# Patient Record
Sex: Male | Born: 1945
Health system: Southern US, Community
[De-identification: ages and names within clinical notes are randomized; demographics above are authoritative.]

## PROBLEM LIST (undated history)

## (undated) DIAGNOSIS — N400 Enlarged prostate without lower urinary tract symptoms: Secondary | ICD-10-CM

## (undated) DIAGNOSIS — I639 Cerebral infarction, unspecified: Secondary | ICD-10-CM

## (undated) DIAGNOSIS — I10 Essential (primary) hypertension: Secondary | ICD-10-CM

## (undated) DIAGNOSIS — E785 Hyperlipidemia, unspecified: Secondary | ICD-10-CM

## (undated) DIAGNOSIS — K219 Gastro-esophageal reflux disease without esophagitis: Secondary | ICD-10-CM

## (undated) HISTORY — PX: TONSILLECTOMY: SUR1361

## (undated) HISTORY — DX: Gastro-esophageal reflux disease without esophagitis: K21.9

## (undated) HISTORY — DX: Hyperlipidemia, unspecified: E78.5

## (undated) HISTORY — PX: COLONOSCOPY: SHX174

## (undated) HISTORY — DX: Benign prostatic hyperplasia without lower urinary tract symptoms: N40.0

## (undated) HISTORY — DX: Essential (primary) hypertension: I10

---

## 2005-02-04 ENCOUNTER — Ambulatory Visit: Payer: Self-pay | Admitting: Unknown Physician Specialty

## 2012-06-01 DIAGNOSIS — R809 Proteinuria, unspecified: Secondary | ICD-10-CM | POA: Insufficient documentation

## 2013-07-01 DIAGNOSIS — E785 Hyperlipidemia, unspecified: Secondary | ICD-10-CM | POA: Diagnosis not present

## 2013-07-01 DIAGNOSIS — I10 Essential (primary) hypertension: Secondary | ICD-10-CM | POA: Diagnosis not present

## 2013-08-05 DIAGNOSIS — N401 Enlarged prostate with lower urinary tract symptoms: Secondary | ICD-10-CM | POA: Diagnosis not present

## 2013-08-05 DIAGNOSIS — N529 Male erectile dysfunction, unspecified: Secondary | ICD-10-CM | POA: Diagnosis not present

## 2013-08-05 DIAGNOSIS — R972 Elevated prostate specific antigen [PSA]: Secondary | ICD-10-CM | POA: Diagnosis not present

## 2013-12-16 DIAGNOSIS — Q825 Congenital non-neoplastic nevus: Secondary | ICD-10-CM | POA: Diagnosis not present

## 2013-12-16 DIAGNOSIS — L570 Actinic keratosis: Secondary | ICD-10-CM | POA: Diagnosis not present

## 2013-12-16 DIAGNOSIS — L819 Disorder of pigmentation, unspecified: Secondary | ICD-10-CM | POA: Diagnosis not present

## 2013-12-16 DIAGNOSIS — D1801 Hemangioma of skin and subcutaneous tissue: Secondary | ICD-10-CM | POA: Diagnosis not present

## 2013-12-16 DIAGNOSIS — L821 Other seborrheic keratosis: Secondary | ICD-10-CM | POA: Diagnosis not present

## 2013-12-26 DIAGNOSIS — I10 Essential (primary) hypertension: Secondary | ICD-10-CM | POA: Diagnosis not present

## 2013-12-26 DIAGNOSIS — E785 Hyperlipidemia, unspecified: Secondary | ICD-10-CM | POA: Diagnosis not present

## 2013-12-26 DIAGNOSIS — R69 Illness, unspecified: Secondary | ICD-10-CM | POA: Diagnosis not present

## 2013-12-26 DIAGNOSIS — F411 Generalized anxiety disorder: Secondary | ICD-10-CM | POA: Diagnosis not present

## 2014-06-27 DIAGNOSIS — E784 Other hyperlipidemia: Secondary | ICD-10-CM | POA: Diagnosis not present

## 2014-06-27 DIAGNOSIS — Z23 Encounter for immunization: Secondary | ICD-10-CM | POA: Diagnosis not present

## 2014-06-27 DIAGNOSIS — E785 Hyperlipidemia, unspecified: Secondary | ICD-10-CM | POA: Diagnosis not present

## 2014-06-27 DIAGNOSIS — I10 Essential (primary) hypertension: Secondary | ICD-10-CM | POA: Diagnosis not present

## 2014-08-04 DIAGNOSIS — N401 Enlarged prostate with lower urinary tract symptoms: Secondary | ICD-10-CM | POA: Diagnosis not present

## 2014-08-04 DIAGNOSIS — N529 Male erectile dysfunction, unspecified: Secondary | ICD-10-CM | POA: Diagnosis not present

## 2014-08-04 DIAGNOSIS — Z79899 Other long term (current) drug therapy: Secondary | ICD-10-CM | POA: Diagnosis not present

## 2014-08-04 DIAGNOSIS — R972 Elevated prostate specific antigen [PSA]: Secondary | ICD-10-CM | POA: Diagnosis not present

## 2014-08-04 DIAGNOSIS — I10 Essential (primary) hypertension: Secondary | ICD-10-CM | POA: Diagnosis not present

## 2014-12-29 DIAGNOSIS — I10 Essential (primary) hypertension: Secondary | ICD-10-CM | POA: Diagnosis not present

## 2014-12-29 DIAGNOSIS — E784 Other hyperlipidemia: Secondary | ICD-10-CM | POA: Diagnosis not present

## 2014-12-29 LAB — HEPATIC FUNCTION PANEL
ALT: 19 U/L (ref 10–40)
AST: 16 U/L (ref 14–40)

## 2014-12-29 LAB — BASIC METABOLIC PANEL
BUN: 12 mg/dL (ref 4–21)
Creatinine: 0.8 mg/dL (ref <=1.3)
GLUCOSE: 97 mg/dL

## 2014-12-29 LAB — LIPID PANEL
Cholesterol: 163 mg/dL (ref 0–200)
HDL: 83 mg/dL — AB (ref 35–70)
LDL CALC: 69 mg/dL
Triglycerides: 56 mg/dL (ref 40–160)

## 2014-12-29 LAB — FECAL OCCULT BLOOD, GUAIAC: Fecal Occult Blood: NEGATIVE

## 2015-01-07 ENCOUNTER — Ambulatory Visit
Admit: 2015-01-07 | Disposition: A | Discharge status: Home / Self Care | Payer: Self-pay | Attending: Unknown Physician Specialty | Admitting: Unknown Physician Specialty

## 2015-01-07 DIAGNOSIS — R1013 Epigastric pain: Secondary | ICD-10-CM | POA: Diagnosis not present

## 2015-01-07 DIAGNOSIS — K5730 Diverticulosis of large intestine without perforation or abscess without bleeding: Secondary | ICD-10-CM | POA: Diagnosis not present

## 2015-01-07 DIAGNOSIS — K2960 Other gastritis without bleeding: Secondary | ICD-10-CM | POA: Diagnosis not present

## 2015-01-07 DIAGNOSIS — K2970 Gastritis, unspecified, without bleeding: Secondary | ICD-10-CM | POA: Diagnosis not present

## 2015-01-07 DIAGNOSIS — K210 Gastro-esophageal reflux disease with esophagitis: Secondary | ICD-10-CM | POA: Diagnosis not present

## 2015-01-07 DIAGNOSIS — K449 Diaphragmatic hernia without obstruction or gangrene: Secondary | ICD-10-CM | POA: Diagnosis not present

## 2015-01-07 DIAGNOSIS — Z7982 Long term (current) use of aspirin: Secondary | ICD-10-CM | POA: Diagnosis not present

## 2015-01-07 DIAGNOSIS — D123 Benign neoplasm of transverse colon: Secondary | ICD-10-CM | POA: Diagnosis not present

## 2015-01-07 DIAGNOSIS — Z1211 Encounter for screening for malignant neoplasm of colon: Secondary | ICD-10-CM | POA: Diagnosis not present

## 2015-01-07 DIAGNOSIS — Z79899 Other long term (current) drug therapy: Secondary | ICD-10-CM | POA: Diagnosis not present

## 2015-01-07 DIAGNOSIS — K640 First degree hemorrhoids: Secondary | ICD-10-CM | POA: Diagnosis not present

## 2015-01-07 DIAGNOSIS — D122 Benign neoplasm of ascending colon: Secondary | ICD-10-CM | POA: Diagnosis not present

## 2015-01-07 DIAGNOSIS — I10 Essential (primary) hypertension: Secondary | ICD-10-CM | POA: Diagnosis not present

## 2015-01-07 DIAGNOSIS — K3189 Other diseases of stomach and duodenum: Secondary | ICD-10-CM | POA: Diagnosis not present

## 2015-01-07 LAB — HM COLONOSCOPY

## 2015-01-08 LAB — SURGICAL PATHOLOGY

## 2015-01-11 HISTORY — PX: UPPER GASTROINTESTINAL ENDOSCOPY: SHX188

## 2015-01-23 DIAGNOSIS — Z0000 Encounter for general adult medical examination without abnormal findings: Secondary | ICD-10-CM | POA: Insufficient documentation

## 2015-01-23 DIAGNOSIS — I10 Essential (primary) hypertension: Secondary | ICD-10-CM | POA: Insufficient documentation

## 2015-01-23 DIAGNOSIS — F064 Anxiety disorder due to known physiological condition: Secondary | ICD-10-CM | POA: Insufficient documentation

## 2015-01-23 DIAGNOSIS — E7849 Other hyperlipidemia: Secondary | ICD-10-CM | POA: Insufficient documentation

## 2015-01-23 DIAGNOSIS — F4320 Adjustment disorder, unspecified: Secondary | ICD-10-CM | POA: Insufficient documentation

## 2015-03-03 ENCOUNTER — Other Ambulatory Visit: Payer: Self-pay | Admitting: Family Medicine

## 2015-03-03 DIAGNOSIS — I10 Essential (primary) hypertension: Secondary | ICD-10-CM

## 2015-06-29 ENCOUNTER — Ambulatory Visit (INDEPENDENT_AMBULATORY_CARE_PROVIDER_SITE_OTHER): Payer: Medicare Other | Admitting: Family Medicine

## 2015-06-29 ENCOUNTER — Encounter: Payer: Self-pay | Admitting: Family Medicine

## 2015-06-29 VITALS — BP 122/70 | HR 64 | Ht 68.0 in | Wt 168.0 lb

## 2015-06-29 DIAGNOSIS — E7849 Other hyperlipidemia: Secondary | ICD-10-CM

## 2015-06-29 DIAGNOSIS — E784 Other hyperlipidemia: Secondary | ICD-10-CM

## 2015-06-29 DIAGNOSIS — I10 Essential (primary) hypertension: Secondary | ICD-10-CM

## 2015-06-29 DIAGNOSIS — Z23 Encounter for immunization: Secondary | ICD-10-CM | POA: Diagnosis not present

## 2015-06-29 MED ORDER — SIMVASTATIN 20 MG PO TABS: 20.0000 mg | ORAL_TABLET | Freq: Every day | ORAL | Status: DC

## 2015-06-29 MED ORDER — FISH OIL 1000 MG PO CAPS: 1.0000 | ORAL_CAPSULE | Freq: Two times a day (BID) | ORAL | Status: DC

## 2015-06-29 MED ORDER — NIFEDIPINE ER OSMOTIC RELEASE 30 MG PO TB24: 30.0000 mg | ORAL_TABLET | Freq: Every day | ORAL | Status: DC

## 2015-06-29 MED ORDER — LISINOPRIL 10 MG PO TABS: 10.0000 mg | ORAL_TABLET | Freq: Every day | ORAL | Status: DC

## 2015-06-29 NOTE — Progress Notes (Signed)
Name: Alfred Edwards   MRN: [id number]    DOB: [birth year]   Date:06/29/2015       Progress Note  Subjective  Chief Complaint  Chief Complaint  Patient presents with  . Hypertension  . Hyperlipidemia    Hypertension This is a chronic problem. The current episode started more than 1 year ago. The problem has been gradually improving since onset. The problem is controlled. Pertinent negatives include no anxiety, blurred vision, chest pain, headaches, malaise/fatigue, neck pain, orthopnea, palpitations, peripheral edema, PND, shortness of breath or sweats. There are no associated agents to hypertension. Risk factors for coronary artery disease include dyslipidemia. Past treatments include ACE inhibitors and calcium channel blockers. The current treatment provides moderate improvement. There are no compliance problems.  There is no history of angina, kidney disease, CAD/MI, CVA, heart failure, left ventricular hypertrophy, PVD, renovascular disease or retinopathy. There is no history of chronic renal disease.  Hyperlipidemia This is a recurrent problem. The current episode started more than 1 year ago. The problem is controlled. Recent lipid tests were reviewed and are normal. He has no history of chronic renal disease, diabetes, hypothyroidism, liver disease, obesity or nephrotic syndrome. There are no known factors aggravating his hyperlipidemia. Pertinent negatives include no chest pain, focal sensory loss, focal weakness, leg pain, myalgias or shortness of breath. Current antihyperlipidemic treatment includes statins. The current treatment provides moderate improvement of lipids. There are no compliance problems.     No problem-specific assessment & plan notes found for this encounter.   Past Medical History  Diagnosis Date  . GERD (gastroesophageal reflux disease)   . Hyperlipidemia   . Hypertension   . BPH (benign prostatic hypertrophy)     Past Surgical History  Procedure  Laterality Date  . Tonsillectomy    . Colonoscopy  2005, 01/2015    Dr Vira Agar- cleared for 3 yrs  . Upper gastrointestinal endoscopy  01/2015    Dr Vira Agar- erosions on esophagus    History reviewed. No pertinent family history.  Social History   Social History  . Marital Status: Widowed    Spouse Name: N/A  . Number of Children: N/A  . Years of Education: N/A   Occupational History  . Not on file.   Social History Main Topics  . Smoking status: Former Research scientist (life sciences)  . Smokeless tobacco: Not on file  . Alcohol Use: 0.0 oz/week    0 Standard drinks or equivalent per week  . Drug Use: No  . Sexual Activity: Yes   Other Topics Concern  . Not on file   Social History Narrative    No Known Allergies   Review of Systems  Constitutional: Negative for fever, chills, weight loss and malaise/fatigue.  HENT: Negative for ear discharge, ear pain and sore throat.   Eyes: Negative for blurred vision.  Respiratory: Negative for cough, sputum production, shortness of breath and wheezing.   Cardiovascular: Negative for chest pain, palpitations, orthopnea, leg swelling and PND.  Gastrointestinal: Negative for heartburn, nausea, abdominal pain, diarrhea, constipation, blood in stool and melena.  Genitourinary: Negative for dysuria, urgency, frequency and hematuria.  Musculoskeletal: Negative for myalgias, back pain, joint pain and neck pain.  Skin: Negative for rash.  Neurological: Negative for dizziness, tingling, sensory change, focal weakness and headaches.  Endo/Heme/Allergies: Negative for environmental allergies and polydipsia. Does not bruise/bleed easily.  Psychiatric/Behavioral: Negative for depression and suicidal ideas. The patient is not nervous/anxious and does not have insomnia.      Objective  Filed  Vitals:   06/29/15 0904  BP: 122/70  Pulse: 64  Height: 5\' 8"  (1.727 m)  Weight: 168 lb (76.204 kg)    Physical Exam  Constitutional: He is oriented to person,  place, and time and well-developed, well-nourished, and in no distress.  HENT:  Head: Normocephalic.  Right Ear: External ear normal.  Left Ear: External ear normal.  Nose: Nose normal.  Mouth/Throat: Oropharynx is clear and moist.  Eyes: Conjunctivae and EOM are normal. Pupils are equal, round, and reactive to light. Right eye exhibits no discharge. Left eye exhibits no discharge. No scleral icterus.  Neck: Normal range of motion. Neck supple. No JVD present. No tracheal deviation present. No thyromegaly present.  Cardiovascular: Normal rate, regular rhythm, normal heart sounds and intact distal pulses.  Exam reveals no gallop and no friction rub.   No murmur heard. Pulmonary/Chest: Breath sounds normal. No respiratory distress. He has no wheezes. He has no rales.  Abdominal: Soft. Bowel sounds are normal. He exhibits no mass. There is no hepatosplenomegaly. There is no tenderness. There is no rebound, no guarding and no CVA tenderness.  Musculoskeletal: Normal range of motion. He exhibits no edema or tenderness.  Lymphadenopathy:    He has no cervical adenopathy.  Neurological: He is alert and oriented to person, place, and time. He has normal sensation, normal strength, normal reflexes and intact cranial nerves. No cranial nerve deficit.  Skin: Skin is warm. No rash noted.  Psychiatric: Mood and affect normal.  Nursing note and vitals reviewed.     Assessment & Plan  Problem List Items Addressed This Visit      Cardiovascular and Mediastinum   Essential (primary) hypertension   Relevant Medications   lisinopril (PRINIVIL,ZESTRIL) 10 MG tablet   NIFEdipine (PROCARDIA-XL/ADALAT-CC/NIFEDICAL-XL) 30 MG 24 hr tablet   simvastatin (ZOCOR) 20 MG tablet     Other   Familial multiple lipoprotein-type hyperlipidemia   Relevant Medications   lisinopril (PRINIVIL,ZESTRIL) 10 MG tablet   NIFEdipine (PROCARDIA-XL/ADALAT-CC/NIFEDICAL-XL) 30 MG 24 hr tablet   simvastatin (ZOCOR) 20 MG  tablet   Omega-3 Fatty Acids (FISH OIL) 1000 MG CAPS   Other Relevant Orders   Lipid Profile    Other Visit Diagnoses    Need for influenza vaccination    -  Primary    Relevant Orders    Flu Vaccine QUAD 36+ mos PF IM (Fluarix & Fluzone Quad PF) (Completed)    Essential hypertension        Relevant Medications    lisinopril (PRINIVIL,ZESTRIL) 10 MG tablet    NIFEdipine (PROCARDIA-XL/ADALAT-CC/NIFEDICAL-XL) 30 MG 24 hr tablet    simvastatin (ZOCOR) 20 MG tablet    Other Relevant Orders    Renal Function Panel         Dr. Otilio Miu [facility]  06/29/2015

## 2015-06-30 LAB — LIPID PANEL
CHOL/HDL RATIO: 1.9 ratio (ref 0.0–5.0)
Cholesterol, Total: 166 mg/dL (ref 100–199)
HDL: 87 mg/dL (ref >39)
LDL Calculated: 65 mg/dL (ref 0–99)
Triglycerides: 70 mg/dL (ref 0–149)
VLDL Cholesterol Cal: 14 mg/dL (ref 5–40)

## 2015-06-30 LAB — RENAL FUNCTION PANEL
Albumin: 4.1 g/dL (ref 3.6–4.8)
BUN / CREAT RATIO: 11 (ref 10–22)
BUN: 10 mg/dL (ref 8–27)
CO2: 26 mmol/L (ref 18–29)
CREATININE: 0.9 mg/dL (ref 0.76–1.27)
Calcium: 9.2 mg/dL (ref 8.6–10.2)
Chloride: 102 mmol/L (ref 97–106)
GFR calc Af Amer: 102 mL/min/{1.73_m2} (ref >59)
GFR, EST NON AFRICAN AMERICAN: 88 mL/min/{1.73_m2} (ref >59)
Glucose: 91 mg/dL (ref 65–99)
Phosphorus: 2.7 mg/dL (ref 2.5–4.5)
Potassium: 4.9 mmol/L (ref 3.5–5.2)
SODIUM: 140 mmol/L (ref 136–144)

## 2015-08-10 DIAGNOSIS — R972 Elevated prostate specific antigen [PSA]: Secondary | ICD-10-CM | POA: Diagnosis not present

## 2015-08-10 DIAGNOSIS — N401 Enlarged prostate with lower urinary tract symptoms: Secondary | ICD-10-CM | POA: Diagnosis not present

## 2015-08-10 DIAGNOSIS — Z6826 Body mass index (BMI) 26.0-26.9, adult: Secondary | ICD-10-CM | POA: Diagnosis not present

## 2015-08-24 ENCOUNTER — Other Ambulatory Visit: Payer: Self-pay | Admitting: Family Medicine

## 2015-11-22 ENCOUNTER — Other Ambulatory Visit: Payer: Self-pay | Admitting: Family Medicine

## 2015-11-28 ENCOUNTER — Other Ambulatory Visit: Payer: Self-pay | Admitting: Family Medicine

## 2015-12-28 ENCOUNTER — Ambulatory Visit (INDEPENDENT_AMBULATORY_CARE_PROVIDER_SITE_OTHER): Payer: Medicare Other | Admitting: Family Medicine

## 2015-12-28 ENCOUNTER — Encounter: Payer: Self-pay | Admitting: Family Medicine

## 2015-12-28 VITALS — BP 120/70 | HR 64 | Ht 68.0 in | Wt 170.0 lb

## 2015-12-28 DIAGNOSIS — I10 Essential (primary) hypertension: Secondary | ICD-10-CM

## 2015-12-28 DIAGNOSIS — E785 Hyperlipidemia, unspecified: Secondary | ICD-10-CM | POA: Diagnosis not present

## 2015-12-28 DIAGNOSIS — N401 Enlarged prostate with lower urinary tract symptoms: Secondary | ICD-10-CM | POA: Diagnosis not present

## 2015-12-28 DIAGNOSIS — Z0000 Encounter for general adult medical examination without abnormal findings: Secondary | ICD-10-CM

## 2015-12-28 DIAGNOSIS — D229 Melanocytic nevi, unspecified: Secondary | ICD-10-CM

## 2015-12-28 DIAGNOSIS — M791 Myalgia, unspecified site: Secondary | ICD-10-CM

## 2015-12-28 DIAGNOSIS — R351 Nocturia: Secondary | ICD-10-CM | POA: Diagnosis not present

## 2015-12-28 DIAGNOSIS — Z1211 Encounter for screening for malignant neoplasm of colon: Secondary | ICD-10-CM

## 2015-12-28 LAB — POCT URINALYSIS DIPSTICK
Bilirubin, UA: NEGATIVE
Blood, UA: NEGATIVE
GLUCOSE UA: NEGATIVE
KETONES UA: NEGATIVE
Leukocytes, UA: NEGATIVE
Nitrite, UA: NEGATIVE
Protein, UA: NEGATIVE
SPEC GRAV UA: 1.01
Urobilinogen, UA: 0.2
pH, UA: 5

## 2015-12-28 LAB — HEMOCCULT GUIAC POC 1CARD (OFFICE): FECAL OCCULT BLD: NEGATIVE

## 2015-12-28 MED ORDER — NIFEDIPINE ER OSMOTIC RELEASE 30 MG PO TB24: ORAL_TABLET | ORAL | Status: DC

## 2015-12-28 MED ORDER — LISINOPRIL 10 MG PO TABS: 10.0000 mg | ORAL_TABLET | Freq: Every day | ORAL | Status: DC

## 2015-12-28 NOTE — Patient Instructions (Signed)

## 2015-12-28 NOTE — Progress Notes (Signed)
Name: Alfred Edwards   MRN: [id number]    DOB: 1946-05-03   Date:12/28/2015       Progress Note  Subjective  Chief Complaint  Chief Complaint  Patient presents with  . Annual Exam  . Hypertension  . Hyperlipidemia    HPI Comments: Patient presents for annual physical exam.  Hypertension This is a chronic problem. The current episode started more than 1 year ago. The problem has been gradually improving since onset. The problem is controlled. Pertinent negatives include no anxiety, blurred vision, chest pain, headaches, malaise/fatigue, neck pain, orthopnea, palpitations, peripheral edema, PND, shortness of breath or sweats. There are no associated agents to hypertension. Risk factors for coronary artery disease include dyslipidemia, male gender, sedentary lifestyle and post-menopausal state. Past treatments include ACE inhibitors and calcium channel blockers. The current treatment provides moderate improvement. There are no compliance problems.  There is no history of angina, kidney disease, CAD/MI, CVA, heart failure, left ventricular hypertrophy, PVD, renovascular disease or retinopathy. There is no history of chronic renal disease or a hypertension causing med.  Hyperlipidemia This is a chronic problem. The current episode started more than 1 year ago. The problem is controlled. Recent lipid tests were reviewed and are normal. He has no history of chronic renal disease, diabetes, hypothyroidism, liver disease, obesity or nephrotic syndrome. Associated symptoms include myalgias. Pertinent negatives include no chest pain, focal sensory loss, focal weakness, leg pain or shortness of breath. He is currently on no antihyperlipidemic treatment. The current treatment provides moderate improvement of lipids. There are no compliance problems.     No problem-specific assessment & plan notes found for this encounter.   Past Medical History  Diagnosis Date  . GERD (gastroesophageal reflux disease)    . Hyperlipidemia   . Hypertension   . BPH (benign prostatic hypertrophy)     Past Surgical History  Procedure Laterality Date  . Tonsillectomy    . Colonoscopy  2005, 01/2015    Dr Vira Agar- cleared for 3 yrs  . Upper gastrointestinal endoscopy  01/2015    Dr Vira Agar- erosions on esophagus    History reviewed. No pertinent family history.  Social History   Social History  . Marital Status: Widowed    Spouse Name: N/A  . Number of Children: N/A  . Years of Education: N/A   Occupational History  . Not on file.   Social History Main Topics  . Smoking status: Former Research scientist (life sciences)  . Smokeless tobacco: Not on file  . Alcohol Use: 0.0 oz/week    0 Standard drinks or equivalent per week  . Drug Use: No  . Sexual Activity: Yes   Other Topics Concern  . Not on file   Social History Narrative    No Known Allergies   Review of Systems  Constitutional: Negative for fever, chills, weight loss and malaise/fatigue.  HENT: Negative for ear discharge, ear pain and sore throat.   Eyes: Negative for blurred vision.  Respiratory: Negative for cough, sputum production, shortness of breath and wheezing.   Cardiovascular: Negative for chest pain, palpitations, orthopnea, leg swelling and PND.  Gastrointestinal: Negative for heartburn, nausea, abdominal pain, diarrhea, constipation, blood in stool and melena.  Genitourinary: Negative for dysuria, urgency, frequency and hematuria.       Nocturia  Musculoskeletal: Positive for myalgias. Negative for back pain, joint pain and neck pain.       Back and legs  Skin: Negative for rash.  Neurological: Negative for dizziness, tingling, sensory change, focal weakness  and headaches.  Endo/Heme/Allergies: Negative for environmental allergies and polydipsia. Does not bruise/bleed easily.  Psychiatric/Behavioral: Negative for depression and suicidal ideas. The patient is not nervous/anxious and does not have insomnia.      Objective  Filed  Vitals:   12/28/15 0942  BP: 120/70  Pulse: 64  Height: 5\' 8"  (1.727 m)  Weight: 170 lb (77.111 kg)    Physical Exam  Constitutional: He is oriented to person, place, and time and well-developed, well-nourished, and in no distress.  HENT:  Head: Normocephalic.  Right Ear: External ear normal.  Left Ear: External ear normal.  Nose: Nose normal.  Mouth/Throat: Oropharynx is clear and moist.  Eyes: Conjunctivae and EOM are normal. Pupils are equal, round, and reactive to light. Right eye exhibits no discharge. Left eye exhibits no discharge. No scleral icterus.  Neck: Normal range of motion. Neck supple. No JVD present. No tracheal deviation present. No thyromegaly present.  Cardiovascular: Normal rate, regular rhythm, normal heart sounds and intact distal pulses.  Exam reveals no gallop and no friction rub.   No murmur heard. Pulmonary/Chest: Breath sounds normal. No respiratory distress. He has no wheezes. He has no rales.  Abdominal: Soft. Bowel sounds are normal. He exhibits no mass. There is no hepatosplenomegaly. There is no tenderness. There is no rebound, no guarding and no CVA tenderness.  Genitourinary: Rectum normal, prostate normal and testes/scrotum normal. Guaiac negative stool.  Musculoskeletal: Normal range of motion. He exhibits no edema or tenderness.  Lymphadenopathy:    He has no cervical adenopathy.  Neurological: He is alert and oriented to person, place, and time. He has normal sensation, normal strength, normal reflexes and intact cranial nerves. No cranial nerve deficit.  Skin: Skin is warm. No rash noted.     Pigmented macular nevi x 2  Psychiatric: Mood and affect normal.  Nursing note and vitals reviewed.     Assessment & Plan  Problem List Items Addressed This Visit      Cardiovascular and Mediastinum   Essential hypertension   Relevant Medications   lisinopril (PRINIVIL,ZESTRIL) 10 MG tablet   NIFEdipine (PROCARDIA-XL/ADALAT-CC/NIFEDICAL-XL) 30  MG 24 hr tablet   Other Relevant Orders   POCT Urinalysis Dipstick (Completed)   Renal Function Panel     Genitourinary   Nocturia associated with benign prostatic hypertrophy   Relevant Orders   PSA     Other   Hyperlipidemia   Relevant Medications   lisinopril (PRINIVIL,ZESTRIL) 10 MG tablet   NIFEdipine (PROCARDIA-XL/ADALAT-CC/NIFEDICAL-XL) 30 MG 24 hr tablet    Other Visit Diagnoses    Annual physical exam    -  Primary    Multiple pigmented nevi        Relevant Orders    Ambulatory referral to Dermatology    Myalgia        will d/c statin recheck lipid 4 wks    Colon cancer screening        Relevant Orders    POCT Occult Blood Stool (Completed)         Dr. Otilio Miu [facility]  12/28/2015

## 2015-12-29 LAB — RENAL FUNCTION PANEL
Albumin: 4.5 g/dL (ref 3.6–4.8)
BUN / CREAT RATIO: 14 (ref 10–24)
BUN: 15 mg/dL (ref 8–27)
CALCIUM: 9.6 mg/dL (ref 8.6–10.2)
CO2: 26 mmol/L (ref 18–29)
Chloride: 101 mmol/L (ref 96–106)
Creatinine, Ser: 1.04 mg/dL (ref 0.76–1.27)
GFR calc Af Amer: 85 mL/min/{1.73_m2} (ref >59)
GFR calc non Af Amer: 74 mL/min/{1.73_m2} (ref >59)
GLUCOSE: 88 mg/dL (ref 65–99)
PHOSPHORUS: 2.9 mg/dL (ref 2.5–4.5)
POTASSIUM: 5.4 mmol/L — AB (ref 3.5–5.2)
Sodium: 144 mmol/L (ref 134–144)

## 2015-12-29 LAB — PSA: Prostate Specific Ag, Serum: 2.3 ng/mL (ref 0.0–4.0)

## 2015-12-30 DIAGNOSIS — L570 Actinic keratosis: Secondary | ICD-10-CM | POA: Diagnosis not present

## 2015-12-30 DIAGNOSIS — L821 Other seborrheic keratosis: Secondary | ICD-10-CM | POA: Diagnosis not present

## 2015-12-30 DIAGNOSIS — D485 Neoplasm of uncertain behavior of skin: Secondary | ICD-10-CM | POA: Diagnosis not present

## 2016-01-27 DIAGNOSIS — D225 Melanocytic nevi of trunk: Secondary | ICD-10-CM | POA: Diagnosis not present

## 2016-01-27 DIAGNOSIS — D485 Neoplasm of uncertain behavior of skin: Secondary | ICD-10-CM | POA: Diagnosis not present

## 2016-02-18 ENCOUNTER — Other Ambulatory Visit: Payer: Medicare Other

## 2016-02-18 DIAGNOSIS — E785 Hyperlipidemia, unspecified: Secondary | ICD-10-CM | POA: Diagnosis not present

## 2016-02-19 LAB — LIPID PANEL WITH LDL/HDL RATIO
CHOLESTEROL TOTAL: 174 mg/dL (ref 100–199)
HDL: 83 mg/dL (ref >39)
LDL Calculated: 81 mg/dL (ref 0–99)
LDl/HDL Ratio: 1 ratio units (ref 0.0–3.6)
TRIGLYCERIDES: 52 mg/dL (ref 0–149)
VLDL Cholesterol Cal: 10 mg/dL (ref 5–40)

## 2016-02-19 NOTE — Addendum Note (Signed)
Addended by: Otilio Miu C on: 02/19/2016 12:03 PM   Modules accepted: Medications

## 2016-02-28 ENCOUNTER — Other Ambulatory Visit: Payer: Self-pay | Admitting: Family Medicine

## 2016-03-23 ENCOUNTER — Other Ambulatory Visit: Payer: Self-pay | Admitting: Family Medicine

## 2016-05-27 ENCOUNTER — Other Ambulatory Visit: Payer: Self-pay | Admitting: Family Medicine

## 2016-07-05 ENCOUNTER — Other Ambulatory Visit: Payer: Self-pay | Admitting: Family Medicine

## 2016-08-02 ENCOUNTER — Encounter: Payer: Self-pay | Admitting: Family Medicine

## 2016-08-02 ENCOUNTER — Ambulatory Visit (INDEPENDENT_AMBULATORY_CARE_PROVIDER_SITE_OTHER): Payer: Medicare Other | Admitting: Family Medicine

## 2016-08-02 VITALS — BP 132/78 | HR 68 | Ht 68.0 in | Wt 175.0 lb

## 2016-08-02 DIAGNOSIS — K219 Gastro-esophageal reflux disease without esophagitis: Secondary | ICD-10-CM | POA: Diagnosis not present

## 2016-08-02 DIAGNOSIS — E784 Other hyperlipidemia: Secondary | ICD-10-CM | POA: Diagnosis not present

## 2016-08-02 DIAGNOSIS — E7849 Other hyperlipidemia: Secondary | ICD-10-CM

## 2016-08-02 DIAGNOSIS — I10 Essential (primary) hypertension: Secondary | ICD-10-CM

## 2016-08-02 DIAGNOSIS — Z23 Encounter for immunization: Secondary | ICD-10-CM

## 2016-08-02 MED ORDER — SIMVASTATIN 20 MG PO TABS: 20.0000 mg | ORAL_TABLET | Freq: Every day | ORAL | 1 refills | Status: DC

## 2016-08-02 MED ORDER — LISINOPRIL 10 MG PO TABS: 10.0000 mg | ORAL_TABLET | Freq: Every day | ORAL | 1 refills | Status: DC

## 2016-08-02 MED ORDER — NIFEDIPINE ER OSMOTIC RELEASE 30 MG PO TB24: ORAL_TABLET | ORAL | 1 refills | Status: DC

## 2016-08-02 MED ORDER — RANITIDINE HCL 300 MG PO TABS: 300.0000 mg | ORAL_TABLET | Freq: Every day | ORAL | 2 refills | Status: DC

## 2016-08-02 NOTE — Progress Notes (Signed)
Name: Alfred Edwards   MRN: [id number]    DOB: 11-29-1945   Date:08/02/2016       Progress Note  Subjective  Chief Complaint  Chief Complaint  Patient presents with  . Hypertension  . Hyperlipidemia    Hypertension  This is a chronic problem. The current episode started more than 1 year ago. The problem has been gradually improving since onset. The problem is controlled. Pertinent negatives include no anxiety, blurred vision, chest pain, headaches, malaise/fatigue, neck pain, orthopnea, palpitations, peripheral edema, PND, shortness of breath or sweats. There are no associated agents to hypertension. There are no known risk factors for coronary artery disease. Past treatments include ACE inhibitors and calcium channel blockers. The current treatment provides moderate improvement. There are no compliance problems.  There is no history of angina, kidney disease, CAD/MI, CVA, heart failure, left ventricular hypertrophy, PVD, renovascular disease or retinopathy. There is no history of chronic renal disease or a hypertension causing med.  Hyperlipidemia  This is a chronic problem. The current episode started more than 1 month ago. The problem is controlled. Recent lipid tests were reviewed and are normal. He has no history of chronic renal disease, hypothyroidism, liver disease, obesity or nephrotic syndrome. There are no known factors aggravating his hyperlipidemia. Pertinent negatives include no chest pain, focal weakness, myalgias or shortness of breath. Current antihyperlipidemic treatment includes statins. The current treatment provides moderate improvement of lipids. There are no compliance problems.  Risk factors for coronary artery disease include hypertension, male sex, post-menopausal and dyslipidemia.    No problem-specific Assessment & Plan notes found for this encounter.   Past Medical History:  Diagnosis Date  . BPH (benign prostatic hypertrophy)   . GERD (gastroesophageal reflux  disease)   . Hyperlipidemia   . Hypertension     Past Surgical History:  Procedure Laterality Date  . COLONOSCOPY  2005, 01/2015   Dr Vira Agar- cleared for 3 yrs  . TONSILLECTOMY    . UPPER GASTROINTESTINAL ENDOSCOPY  01/2015   Dr Vira Agar- erosions on esophagus    History reviewed. No pertinent family history.  Social History   Social History  . Marital status: Widowed    Spouse name: N/A  . Number of children: N/A  . Years of education: N/A   Occupational History  . Not on file.   Social History Main Topics  . Smoking status: Former Research scientist (life sciences)  . Smokeless tobacco: Not on file  . Alcohol use 0.0 oz/week  . Drug use: No  . Sexual activity: Yes   Other Topics Concern  . Not on file   Social History Narrative  . No narrative on file    No Known Allergies   Review of Systems  Constitutional: Negative for chills, fever, malaise/fatigue and weight loss.  HENT: Negative for congestion, ear discharge, ear pain, nosebleeds, sinus pain and sore throat.   Eyes: Negative for blurred vision, photophobia and pain.  Respiratory: Negative for cough, sputum production, shortness of breath and wheezing.   Cardiovascular: Negative for chest pain, palpitations, orthopnea, leg swelling and PND.  Gastrointestinal: Negative for abdominal pain, blood in stool, constipation, diarrhea, heartburn, melena and nausea.  Genitourinary: Negative for dysuria, frequency, hematuria and urgency.  Musculoskeletal: Negative for back pain, joint pain, myalgias and neck pain.  Skin: Negative for rash.  Neurological: Negative for dizziness, tingling, sensory change, focal weakness and headaches.  Endo/Heme/Allergies: Negative for environmental allergies and polydipsia. Does not bruise/bleed easily.  Psychiatric/Behavioral: Negative for depression and suicidal ideas. The  patient is not nervous/anxious and does not have insomnia.      Objective  Vitals:   08/02/16 1019  BP: 132/78  Pulse: 68   Weight: 175 lb (79.4 kg)  Height: 5\' 8"  (1.727 m)    Physical Exam  Constitutional: He is oriented to person, place, and time and well-developed, well-nourished, and in no distress.  HENT:  Head: Normocephalic.  Right Ear: External ear normal.  Left Ear: External ear normal.  Nose: Nose normal.  Mouth/Throat: Oropharynx is clear and moist.  Eyes: Conjunctivae and EOM are normal. Pupils are equal, round, and reactive to light. Right eye exhibits no discharge. Left eye exhibits no discharge. No scleral icterus.  Neck: Normal range of motion. Neck supple. No JVD present. No tracheal deviation present. No thyromegaly present.  Cardiovascular: Normal rate, regular rhythm, normal heart sounds and intact distal pulses.  Exam reveals no gallop and no friction rub.   No murmur heard. Pulmonary/Chest: Breath sounds normal. No respiratory distress. He has no wheezes. He has no rales.  Abdominal: Soft. Bowel sounds are normal. He exhibits no mass. There is no hepatosplenomegaly. There is no tenderness. There is no rebound, no guarding and no CVA tenderness.  Musculoskeletal: Normal range of motion. He exhibits no edema or tenderness.  Lymphadenopathy:    He has no cervical adenopathy.  Neurological: He is alert and oriented to person, place, and time. He has normal sensation, normal strength, normal reflexes and intact cranial nerves. No cranial nerve deficit.  Skin: Skin is warm. No rash noted.  Psychiatric: Mood and affect normal.  Nursing note and vitals reviewed.     Assessment & Plan  Problem List Items Addressed This Visit      Cardiovascular and Mediastinum   Essential (primary) hypertension - Primary   Relevant Medications   NIFEdipine (PROCARDIA-XL/ADALAT-CC/NIFEDICAL-XL) 30 MG 24 hr tablet   lisinopril (PRINIVIL,ZESTRIL) 10 MG tablet   simvastatin (ZOCOR) 20 MG tablet   Other Relevant Orders   Renal Function Panel   Essential hypertension   Relevant Medications   NIFEdipine  (PROCARDIA-XL/ADALAT-CC/NIFEDICAL-XL) 30 MG 24 hr tablet   lisinopril (PRINIVIL,ZESTRIL) 10 MG tablet   simvastatin (ZOCOR) 20 MG tablet     Other   Familial multiple lipoprotein-type hyperlipidemia   Relevant Medications   NIFEdipine (PROCARDIA-XL/ADALAT-CC/NIFEDICAL-XL) 30 MG 24 hr tablet   lisinopril (PRINIVIL,ZESTRIL) 10 MG tablet   simvastatin (ZOCOR) 20 MG tablet   Other Relevant Orders   Lipid Profile    Other Visit Diagnoses    Flu vaccine need       Relevant Orders   Flu Vaccine QUAD 36+ mos PF IM (Fluarix & Fluzone Quad PF) (Completed)   Gastroesophageal reflux disease without esophagitis       Relevant Medications   ranitidine (ZANTAC) 300 MG tablet     I spent 25 minutes with this patient, More than 50% of that time was spent in face to face education, counseling and care coordination.  Dr. Macon [facility]  08/02/16

## 2016-08-03 ENCOUNTER — Other Ambulatory Visit: Payer: Self-pay | Admitting: Family Medicine

## 2016-08-03 LAB — RENAL FUNCTION PANEL
ALBUMIN: 4.2 g/dL (ref 3.6–4.8)
BUN/Creatinine Ratio: 21 (ref 10–24)
BUN: 18 mg/dL (ref 8–27)
CO2: 23 mmol/L (ref 18–29)
CREATININE: 0.85 mg/dL (ref 0.76–1.27)
Calcium: 9 mg/dL (ref 8.6–10.2)
Chloride: 102 mmol/L (ref 96–106)
GFR, EST AFRICAN AMERICAN: 103 mL/min/{1.73_m2} (ref >59)
GFR, EST NON AFRICAN AMERICAN: 90 mL/min/{1.73_m2} (ref >59)
Glucose: 71 mg/dL (ref 65–99)
Phosphorus: 3.1 mg/dL (ref 2.5–4.5)
Potassium: 5.8 mmol/L — ABNORMAL HIGH (ref 3.5–5.2)
Sodium: 141 mmol/L (ref 134–144)

## 2016-08-03 LAB — LIPID PANEL
CHOL/HDL RATIO: 2.1 ratio (ref 0.0–5.0)
Cholesterol, Total: 157 mg/dL (ref 100–199)
HDL: 76 mg/dL (ref >39)
LDL Calculated: 69 mg/dL (ref 0–99)
Triglycerides: 61 mg/dL (ref 0–149)
VLDL Cholesterol Cal: 12 mg/dL (ref 5–40)

## 2016-08-11 DIAGNOSIS — R972 Elevated prostate specific antigen [PSA]: Secondary | ICD-10-CM | POA: Diagnosis not present

## 2016-08-11 DIAGNOSIS — N401 Enlarged prostate with lower urinary tract symptoms: Secondary | ICD-10-CM | POA: Diagnosis not present

## 2016-08-23 DIAGNOSIS — L821 Other seborrheic keratosis: Secondary | ICD-10-CM | POA: Diagnosis not present

## 2016-08-23 DIAGNOSIS — I788 Other diseases of capillaries: Secondary | ICD-10-CM | POA: Diagnosis not present

## 2016-08-23 DIAGNOSIS — Z1283 Encounter for screening for malignant neoplasm of skin: Secondary | ICD-10-CM | POA: Diagnosis not present

## 2016-08-23 DIAGNOSIS — Z872 Personal history of diseases of the skin and subcutaneous tissue: Secondary | ICD-10-CM | POA: Diagnosis not present

## 2016-08-23 DIAGNOSIS — Z09 Encounter for follow-up examination after completed treatment for conditions other than malignant neoplasm: Secondary | ICD-10-CM | POA: Diagnosis not present

## 2017-01-24 ENCOUNTER — Other Ambulatory Visit: Payer: Self-pay | Admitting: Family Medicine

## 2017-01-24 DIAGNOSIS — E7849 Other hyperlipidemia: Secondary | ICD-10-CM

## 2017-01-25 DIAGNOSIS — L821 Other seborrheic keratosis: Secondary | ICD-10-CM | POA: Diagnosis not present

## 2017-01-25 DIAGNOSIS — Z86018 Personal history of other benign neoplasm: Secondary | ICD-10-CM | POA: Diagnosis not present

## 2017-01-25 DIAGNOSIS — L570 Actinic keratosis: Secondary | ICD-10-CM | POA: Diagnosis not present

## 2017-02-02 ENCOUNTER — Encounter: Payer: Self-pay | Admitting: Family Medicine

## 2017-02-02 ENCOUNTER — Ambulatory Visit (INDEPENDENT_AMBULATORY_CARE_PROVIDER_SITE_OTHER): Payer: Medicare Other | Admitting: Family Medicine

## 2017-02-02 VITALS — BP 144/68 | HR 64 | Ht 68.0 in | Wt 177.0 lb

## 2017-02-02 DIAGNOSIS — I872 Venous insufficiency (chronic) (peripheral): Secondary | ICD-10-CM | POA: Diagnosis not present

## 2017-02-02 DIAGNOSIS — E7849 Other hyperlipidemia: Secondary | ICD-10-CM

## 2017-02-02 DIAGNOSIS — E784 Other hyperlipidemia: Secondary | ICD-10-CM

## 2017-02-02 DIAGNOSIS — K219 Gastro-esophageal reflux disease without esophagitis: Secondary | ICD-10-CM

## 2017-02-02 DIAGNOSIS — I10 Essential (primary) hypertension: Secondary | ICD-10-CM

## 2017-02-02 MED ORDER — FISH OIL 1000 MG PO CAPS: 1.0000 | ORAL_CAPSULE | Freq: Two times a day (BID) | ORAL | 11 refills | Status: DC

## 2017-02-02 MED ORDER — NIFEDIPINE ER OSMOTIC RELEASE 30 MG PO TB24: ORAL_TABLET | ORAL | 1 refills | Status: DC

## 2017-02-02 MED ORDER — SIMVASTATIN 20 MG PO TABS: 20.0000 mg | ORAL_TABLET | Freq: Every day | ORAL | 1 refills | Status: DC

## 2017-02-02 MED ORDER — RANITIDINE HCL 300 MG PO TABS: 300.0000 mg | ORAL_TABLET | Freq: Every day | ORAL | 2 refills | Status: DC

## 2017-02-02 MED ORDER — LISINOPRIL 10 MG PO TABS: 10.0000 mg | ORAL_TABLET | Freq: Every day | ORAL | 1 refills | Status: DC

## 2017-02-02 NOTE — Progress Notes (Signed)
Name: Alfred Edwards   MRN: [id number]    DOB: December 28, 1945   Date:02/02/2017       Progress Note  Subjective  Chief Complaint  Chief Complaint  Patient presents with  . Gastroesophageal Reflux  . Hypertension  . Hyperlipidemia    Gastroesophageal Reflux  He reports no abdominal pain, no belching, no chest pain, no choking, no coughing, no dysphagia, no early satiety, no globus sensation, no heartburn, no hoarse voice, no nausea, no sore throat, no stridor, no tooth decay, no water brash or no wheezing. This is a new problem. The current episode started more than 1 year ago. The problem occurs frequently. The problem has been gradually improving. The symptoms are aggravated by certain foods. Pertinent negatives include no anemia, fatigue, melena, muscle weakness, orthopnea or weight loss. He has tried a histamine-2 antagonist for the symptoms. The treatment provided moderate relief.  Hypertension  This is a chronic problem. The current episode started more than 1 year ago. The problem is unchanged. The problem is controlled. Pertinent negatives include no anxiety, blurred vision, chest pain, headaches, malaise/fatigue, neck pain, orthopnea, palpitations, peripheral edema, PND, shortness of breath or sweats. There are no associated agents to hypertension. Risk factors for coronary artery disease include dyslipidemia, male gender and post-menopausal state. Past treatments include calcium channel blockers and ACE inhibitors. The current treatment provides moderate improvement. There are no compliance problems.  There is no history of angina, kidney disease, CAD/MI, CVA, heart failure, left ventricular hypertrophy, PVD or retinopathy. There is no history of chronic renal disease, a hypertension causing med or renovascular disease.  Hyperlipidemia  This is a chronic problem. The current episode started more than 1 year ago. The problem is controlled. Recent lipid tests were reviewed and are normal. He  has no history of chronic renal disease, diabetes, hypothyroidism, liver disease, obesity or nephrotic syndrome. Pertinent negatives include no chest pain, focal weakness, myalgias or shortness of breath. Current antihyperlipidemic treatment includes statins. The current treatment provides moderate improvement of lipids.    No problem-specific Assessment & Plan notes found for this encounter.   Past Medical History:  Diagnosis Date  . BPH (benign prostatic hypertrophy)   . GERD (gastroesophageal reflux disease)   . Hyperlipidemia   . Hypertension     Past Surgical History:  Procedure Laterality Date  . COLONOSCOPY  2005, 01/2015   Dr Vira Agar- cleared for 3 yrs  . TONSILLECTOMY    . UPPER GASTROINTESTINAL ENDOSCOPY  01/2015   Dr Vira Agar- erosions on esophagus    No family history on file.  Social History   Social History  . Marital status: Widowed    Spouse name: N/A  . Number of children: N/A  . Years of education: N/A   Occupational History  . Not on file.   Social History Main Topics  . Smoking status: Former Research scientist (life sciences)  . Smokeless tobacco: Never Used  . Alcohol use 0.0 oz/week  . Drug use: No  . Sexual activity: Yes   Other Topics Concern  . Not on file   Social History Narrative  . No narrative on file    No Known Allergies  Outpatient Medications Prior to Visit  Medication Sig Dispense Refill  . Ascorbic Acid (VITAMIN C) 500 MG CAPS Take 1 capsule by mouth 2 (two) times daily.     Marland Kitchen aspirin 81 MG tablet Take 1 tablet by mouth daily.    . Cholecalciferol (VITAMIN D3) 2000 UNITS capsule Take 1 capsule by mouth  daily.    . Coenzyme Q10 (COQ-10) 100 MG CAPS Take 1 capsule by mouth daily.    Marland Kitchen dutasteride (AVODART) 0.5 MG capsule Take 1 capsule by mouth every other day. Stoioff    . Multiple Vitamins-Minerals (CENTRUM SILVER ADULT 50+ PO) Take 1 tablet by mouth daily.    . Zinc 25 MG TABS Take 1 tablet by mouth daily.    Marland Kitchen lisinopril (PRINIVIL,ZESTRIL) 10 MG  tablet Take 1 tablet (10 mg total) by mouth daily. 90 tablet 1  . NIFEdipine (PROCARDIA-XL/ADALAT-CC/NIFEDICAL-XL) 30 MG 24 hr tablet TAKE 1 TABLET (30 MG TOTAL) BY MOUTH DAILY. 90 tablet 1  . Omega-3 Fatty Acids (FISH OIL) 1000 MG CAPS Take 1 capsule (1,000 mg total) by mouth 2 (two) times daily. 30 capsule 11  . ranitidine (ZANTAC) 300 MG tablet Take 1 tablet (300 mg total) by mouth at bedtime. (Patient taking differently: Take 300 mg by mouth as needed. ) 90 tablet 2  . simvastatin (ZOCOR) 20 MG tablet TAKE 1 TABLET BY MOUTH DAILY 30 tablet 4  . Omeprazole 20 MG TBEC Take 20 mg by mouth daily. Dr Vira Agar     . simvastatin (ZOCOR) 20 MG tablet TAKE 1 TABLET (20 MG TOTAL) BY MOUTH DAILY. 30 tablet 0   No facility-administered medications prior to visit.     Review of Systems  Constitutional: Negative for chills, fatigue, fever, malaise/fatigue and weight loss.  HENT: Negative for ear discharge, ear pain, hoarse voice and sore throat.   Eyes: Negative for blurred vision.  Respiratory: Negative for cough, sputum production, choking, shortness of breath and wheezing.   Cardiovascular: Positive for leg swelling. Negative for chest pain, palpitations, orthopnea and PND.  Gastrointestinal: Negative for abdominal pain, blood in stool, constipation, diarrhea, dysphagia, heartburn, melena and nausea.  Genitourinary: Negative for dysuria, frequency, hematuria and urgency.  Musculoskeletal: Negative for back pain, joint pain, myalgias, muscle weakness and neck pain.  Skin: Negative for rash.  Neurological: Negative for dizziness, tingling, sensory change, focal weakness and headaches.  Endo/Heme/Allergies: Negative for environmental allergies and polydipsia. Does not bruise/bleed easily.  Psychiatric/Behavioral: Negative for depression and suicidal ideas. The patient is not nervous/anxious and does not have insomnia.      Objective  Vitals:   02/02/17 0909  BP: (!) 144/68  Pulse: 64  Weight:  177 lb (80.3 kg)  Height: 5\' 8"  (1.727 m)    Physical Exam  Constitutional: He is oriented to person, place, and time and well-developed, well-nourished, and in no distress.  HENT:  Head: Normocephalic.  Right Ear: External ear normal.  Left Ear: External ear normal.  Nose: Nose normal.  Mouth/Throat: Oropharynx is clear and moist.  Eyes: Conjunctivae and EOM are normal. Pupils are equal, round, and reactive to light. Right eye exhibits no discharge. Left eye exhibits no discharge. No scleral icterus.  Neck: Normal range of motion. Neck supple. No JVD present. No tracheal deviation present. No thyromegaly present.  Cardiovascular: Normal rate, regular rhythm, normal heart sounds and intact distal pulses.  Exam reveals no gallop and no friction rub.   No murmur heard. Pulmonary/Chest: Breath sounds normal. No respiratory distress. He has no wheezes. He has no rales.  Abdominal: Soft. Bowel sounds are normal. He exhibits no mass. There is no hepatosplenomegaly. There is no tenderness. There is no rebound, no guarding and no CVA tenderness.  Musculoskeletal: Normal range of motion. He exhibits no edema or tenderness.  Lymphadenopathy:    He has no cervical adenopathy.  Neurological:  He is alert and oriented to person, place, and time. He has normal sensation, normal strength, normal reflexes and intact cranial nerves. No cranial nerve deficit.  Skin: Skin is warm. No rash noted.  Psychiatric: Mood and affect normal.  Nursing note and vitals reviewed.     Assessment & Plan  Problem List Items Addressed This Visit      Cardiovascular and Mediastinum   Essential (primary) hypertension - Primary   Relevant Medications   simvastatin (ZOCOR) 20 MG tablet   lisinopril (PRINIVIL,ZESTRIL) 10 MG tablet   NIFEdipine (PROCARDIA-XL/ADALAT-CC/NIFEDICAL-XL) 30 MG 24 hr tablet   Other Relevant Orders   Renal Function Panel   Venous insufficiency   Relevant Medications   simvastatin (ZOCOR) 20  MG tablet   lisinopril (PRINIVIL,ZESTRIL) 10 MG tablet   NIFEdipine (PROCARDIA-XL/ADALAT-CC/NIFEDICAL-XL) 30 MG 24 hr tablet     Digestive   Gastroesophageal reflux disease without esophagitis   Relevant Medications   ranitidine (ZANTAC) 300 MG tablet     Other   Familial multiple lipoprotein-type hyperlipidemia   Relevant Medications   simvastatin (ZOCOR) 20 MG tablet   Omega-3 Fatty Acids (FISH OIL) 1000 MG CAPS   lisinopril (PRINIVIL,ZESTRIL) 10 MG tablet   NIFEdipine (PROCARDIA-XL/ADALAT-CC/NIFEDICAL-XL) 30 MG 24 hr tablet      Meds ordered this encounter  Medications  . simvastatin (ZOCOR) 20 MG tablet    Sig: Take 1 tablet (20 mg total) by mouth daily.    Dispense:  90 tablet    Refill:  1  . ranitidine (ZANTAC) 300 MG tablet    Sig: Take 1 tablet (300 mg total) by mouth at bedtime.    Dispense:  90 tablet    Refill:  2  . Omega-3 Fatty Acids (FISH OIL) 1000 MG CAPS    Sig: Take 1 capsule (1,000 mg total) by mouth 2 (two) times daily.    Dispense:  30 capsule    Refill:  11  . lisinopril (PRINIVIL,ZESTRIL) 10 MG tablet    Sig: Take 1 tablet (10 mg total) by mouth daily.    Dispense:  90 tablet    Refill:  1  . NIFEdipine (PROCARDIA-XL/ADALAT-CC/NIFEDICAL-XL) 30 MG 24 hr tablet    Sig: TAKE 1 TABLET (30 MG TOTAL) BY MOUTH DAILY.    Dispense:  90 tablet    Refill:  1      Dr. Otilio Miu [facility]  02/02/17

## 2017-02-03 LAB — RENAL FUNCTION PANEL
Albumin: 4 g/dL (ref 3.6–4.8)
BUN / CREAT RATIO: 17 (ref 10–24)
BUN: 17 mg/dL (ref 8–27)
CALCIUM: 9.2 mg/dL (ref 8.6–10.2)
CHLORIDE: 104 mmol/L (ref 96–106)
CO2: 21 mmol/L (ref 18–29)
Creatinine, Ser: 1.01 mg/dL (ref 0.76–1.27)
GFR calc non Af Amer: 76 mL/min/{1.73_m2} (ref >59)
GFR, EST AFRICAN AMERICAN: 88 mL/min/{1.73_m2} (ref >59)
Glucose: 91 mg/dL (ref 65–99)
POTASSIUM: 5.5 mmol/L — AB (ref 3.5–5.2)
Phosphorus: 3.1 mg/dL (ref 2.5–4.5)
Sodium: 140 mmol/L (ref 134–144)

## 2017-06-13 ENCOUNTER — Telehealth: Payer: Self-pay | Admitting: Family Medicine

## 2017-06-13 NOTE — Telephone Encounter (Signed)
Called pt to sched for Annual Wellness Visit with Nurse Health Advisor. C/b #  [phone]  Kathryn Brown ° °

## 2017-06-14 ENCOUNTER — Other Ambulatory Visit: Payer: Self-pay

## 2017-06-14 ENCOUNTER — Telehealth: Payer: Self-pay | Admitting: Family Medicine

## 2017-06-14 NOTE — Telephone Encounter (Signed)
Patient called about scheduling

## 2017-07-24 ENCOUNTER — Ambulatory Visit (INDEPENDENT_AMBULATORY_CARE_PROVIDER_SITE_OTHER): Payer: Medicare Other

## 2017-07-24 VITALS — BP 138/70 | HR 64 | Temp 98.0°F | Resp 16 | Ht 68.0 in | Wt 180.6 lb

## 2017-07-24 DIAGNOSIS — Z23 Encounter for immunization: Secondary | ICD-10-CM

## 2017-07-24 DIAGNOSIS — Z1159 Encounter for screening for other viral diseases: Secondary | ICD-10-CM

## 2017-07-24 DIAGNOSIS — Z0000 Encounter for general adult medical examination without abnormal findings: Secondary | ICD-10-CM | POA: Diagnosis not present

## 2017-07-24 NOTE — Progress Notes (Signed)
Subjective:   Alfred Edwards is a 71 y.o. male who presents for Medicare Annual/Subsequent preventive examination.  Review of Systems:  N/A Cardiac Risk Factors include: advanced age (>39men, >24 women);dyslipidemia;hypertension     Objective:    Vitals: BP 138/70 (BP Location: Left Arm, Patient Position: Sitting, Cuff Size: Normal)   Pulse 64   Temp 98 F (36.7 C) (Oral)   Resp 16   Ht 5\' 8"  (1.727 m)   Wt 180 lb 9.6 oz (81.9 kg)   BMI 27.46 kg/m   Body mass index is 27.46 kg/m.  Tobacco Social History   Tobacco Use  Smoking Status Former Smoker  Smokeless Tobacco Never Used     Counseling given: Not Answered   Past Medical History:  Diagnosis Date  . BPH (benign prostatic hypertrophy)   . GERD (gastroesophageal reflux disease)   . Hyperlipidemia   . Hypertension    Past Surgical History:  Procedure Laterality Date  . COLONOSCOPY  2005, 01/2015   Dr Vira Agar- cleared for 3 yrs  . TONSILLECTOMY    . UPPER GASTROINTESTINAL ENDOSCOPY  01/2015   Dr Vira Agar- erosions on esophagus   Family History  Problem Relation Age of Onset  . Heart disease Father   . Heart disease Brother    Social History   Substance and Sexual Activity  Sexual Activity Yes    Outpatient Encounter Medications as of 07/24/2017  Medication Sig  . Ascorbic Acid (VITAMIN C) 500 MG CAPS Take 1 capsule by mouth 2 (two) times daily.   Marland Kitchen aspirin 81 MG tablet Take 1 tablet by mouth daily.  . Cholecalciferol (VITAMIN D3) 2000 UNITS capsule Take 1 capsule by mouth daily.  . Coenzyme Q10 (COQ-10) 100 MG CAPS Take 1 capsule by mouth daily.  Marland Kitchen dutasteride (AVODART) 0.5 MG capsule Take 1 capsule 3 (three) times a week by mouth. Stoioff  . lisinopril (PRINIVIL,ZESTRIL) 10 MG tablet Take 1 tablet (10 mg total) by mouth daily.  . Multiple Vitamins-Minerals (CENTRUM SILVER ADULT 50+ PO) Take 1 tablet by mouth daily.  Marland Kitchen NIFEdipine (PROCARDIA-XL/ADALAT-CC/NIFEDICAL-XL) 30 MG 24 hr tablet TAKE 1  TABLET (30 MG TOTAL) BY MOUTH DAILY.  Marland Kitchen Omega-3 Fatty Acids (FISH OIL) 1000 MG CAPS Take 1 capsule (1,000 mg total) by mouth 2 (two) times daily.  . sildenafil (REVATIO) 20 MG tablet Take 20 tablets as needed by mouth.  . simvastatin (ZOCOR) 20 MG tablet Take 1 tablet (20 mg total) by mouth daily.  . Zinc 25 MG TABS Take 1 tablet by mouth daily.  . ranitidine (ZANTAC) 300 MG tablet Take 1 tablet (300 mg total) by mouth at bedtime. (Patient not taking: Reported on 07/24/2017)   No facility-administered encounter medications on file as of 07/24/2017.     Activities of Daily Living In your present state of health, do you have any difficulty performing the following activities: 07/24/2017  Hearing? N  Vision? N  Difficulty concentrating or making decisions? N  Walking or climbing stairs? N  Dressing or bathing? N  Doing errands, shopping? N  Preparing Food and eating ? N  Using the Toilet? N  In the past six months, have you accidently leaked urine? N  Do you have problems with loss of bowel control? N  Managing your Medications? N  Managing your Finances? N  Housekeeping or managing your Housekeeping? N  Some recent data might be hidden    Patient Care Team: Juline Patch, MD as PCP - General (Family Medicine) Garrison,  Ronda Fairly, MD as Consulting Physician (Urology) Jannet Mantis, MD as Consulting Physician (Dermatology)   Assessment:     Exercise Activities and Dietary recommendations Current Exercise Habits: Structured exercise class, Type of exercise: walking(golf), Time (Minutes): 60, Frequency (Times/Week): 3, Weekly Exercise (Minutes/Week): 180, Exercise limited by: None identified  Goals    None     Fall Risk Fall Risk  07/24/2017 02/02/2017 12/28/2015 06/29/2015  Falls in the past year? No No No No   Depression Screen PHQ 2/9 Scores 07/24/2017 02/02/2017 12/28/2015 06/29/2015  PHQ - 2 Score 0 0 0 0    Cognitive Function     6CIT Screen 07/24/2017  What  Year? 0 points  What month? 0 points  What time? 0 points  Count back from 20 0 points  Months in reverse 0 points  Repeat phrase 0 points  Total Score 0    Immunization History  Administered Date(s) Administered  . Influenza,inj,Quad PF,6+ Mos 07/01/2013, 06/27/2014, 06/29/2015, 08/02/2016  . Influenza-Unspecified 06/27/2014  . Pneumococcal Polysaccharide-23 06/23/2008  . Tdap 12/06/2012   Screening Tests Health Maintenance  Topic Date Due  . Hepatitis C Screening  1946/08/26  . INFLUENZA VACCINE  12/17/2017 (Originally 04/12/2017)  . COLONOSCOPY  01/06/2018  . PNA vac Low Risk Adult (2 of 2 - PPSV23) 07/24/2018  . TETANUS/TDAP  12/07/2022      Plan:   I have personally reviewed and addressed the Medicare Annual Wellness questionnaire and have noted the following in the patient's chart:  A. Medical and social history B. Use of alcohol, tobacco or illicit drugs  C. Current medications and supplements D. Functional ability and status E.  Nutritional status F.  Physical activity G. Advance directives H. List of other physicians I.  Hospitalizations, surgeries, and ER visits in previous 12 months J.  [place] such as hearing and vision if needed, cognitive and depression L. Referrals and appointments - none  In addition, I have reviewed and discussed with patient certain preventive protocols, quality metrics, and best practice recommendations. A written personalized care plan for preventive services as well as general preventive health recommendations were provided to patient.  See attached scanned questionnaire for additional information.   Signed,  Aleatha Borer, LPN Nurse Health Advisor  MD Recommendations: None

## 2017-07-24 NOTE — Patient Instructions (Signed)
Alfred Edwards , Thank you for taking time to come for your Medicare Wellness Visit. I appreciate your ongoing commitment to your health goals. Please review the following plan we discussed and let me know if I can assist you in the future.   Screening recommendations/referrals: Colonoscopy: Repeat colon cancer screenings every 3 years Recommended yearly ophthalmology/optometry visit for glaucoma screening and checkup Recommended yearly dental visit for hygiene and checkup  Vaccinations: Influenza vaccine: Please provide a copy of your vaccination record Pneumococcal vaccine: Given today Tdap vaccine: Up to date Shingles vaccine: Declined. Please call your insurance company to determine your out of pocket expense.  Advanced directives: Please bring a copy of your POA (Power of Attorney) and/or Living Will to your next appointment.   Conditions/risks identified: Continue to reduce sweets and carbs from diet  Next appointment: Scheduled to see Dr. Ronnald Ramp on 08/17/17 @ 8:15am.  Please schedule your annual wellness exam in one year  Preventive Care 31 Years and Older, Male Preventive care refers to lifestyle choices and visits with your health care provider that can promote health and wellness. What does preventive care include?  A yearly physical exam. This is also called an annual well check.  Dental exams once or twice a year.  Routine eye exams. Ask your health care provider how often you should have your eyes checked.  Personal lifestyle choices, including:  Daily care of your teeth and gums.  Regular physical activity.  Eating a healthy diet.  Avoiding tobacco and drug use.  Limiting alcohol use.  Practicing safe sex.  Taking low doses of aspirin every day.  Taking vitamin and mineral supplements as recommended by your health care provider. What happens during an annual well check? The services and screenings done by your health care provider during your annual well  check will depend on your age, overall health, lifestyle risk factors, and family history of disease. Counseling  Your health care provider may ask you questions about your:  Alcohol use.  Tobacco use.  Drug use.  Emotional well-being.  Home and relationship well-being.  Sexual activity.  Eating habits.  History of falls.  Memory and ability to understand (cognition).  Work and work Statistician. Screening  You may have the following tests or measurements:  Height, weight, and BMI.  Blood pressure.  Lipid and cholesterol levels. These may be checked every 5 years, or more frequently if you are over 81 years old.  Skin check.  Lung cancer screening. You may have this screening every year starting at age 71 if you have a 30-pack-year history of smoking and currently smoke or have quit within the past 15 years.  Fecal occult blood test (FOBT) of the stool. You may have this test every year starting at age 50.  Flexible sigmoidoscopy or colonoscopy. You may have a sigmoidoscopy every 5 years or a colonoscopy every 10 years starting at age 40.  Prostate cancer screening. Recommendations will vary depending on your family history and other risks.  Hepatitis C blood test.  Hepatitis B blood test.  Sexually transmitted disease (STD) testing.  Diabetes screening. This is done by checking your blood sugar (glucose) after you have not eaten for a while (fasting). You may have this done every 1-3 years.  Abdominal aortic aneurysm (AAA) screening. You may need this if you are a current or former smoker.  Osteoporosis. You may be screened starting at age 28 if you are at high risk. Talk with your health care provider about your  test results, treatment options, and if necessary, the need for more tests. Vaccines  Your health care provider may recommend certain vaccines, such as:  Influenza vaccine. This is recommended every year.  Tetanus, diphtheria, and acellular  pertussis (Tdap, Td) vaccine. You may need a Td booster every 10 years.  Zoster vaccine. You may need this after age 27.  Pneumococcal 13-valent conjugate (PCV13) vaccine. One dose is recommended after age 37.  Pneumococcal polysaccharide (PPSV23) vaccine. One dose is recommended after age 23. Talk to your health care provider about which screenings and vaccines you need and how often you need them. This information is not intended to replace advice given to you by your health care provider. Make sure you discuss any questions you have with your health care provider. Document Released: 09/25/2015 Document Revised: 05/18/2016 Document Reviewed: 06/30/2015 Elsevier Interactive Patient Education  2017 [place] Prevention in the Home Falls can cause injuries. They can happen to people of all ages. There are many things you can do to make your home safe and to help prevent falls. What can I do on the outside of my home?  Regularly fix the edges of walkways and driveways and fix any cracks.  Remove anything that might make you trip as you walk through a door, such as a raised step or threshold.  Trim any bushes or trees on the path to your home.  Use bright outdoor lighting.  Clear any walking paths of anything that might make someone trip, such as rocks or tools.  Regularly check to see if handrails are loose or broken. Make sure that both sides of any steps have handrails.  Any raised decks and porches should have guardrails on the edges.  Have any leaves, snow, or ice cleared regularly.  Use sand or salt on walking paths during winter.  Clean up any spills in your garage right away. This includes oil or grease spills. What can I do in the bathroom?  Use night lights.  Install grab bars by the toilet and in the tub and shower. Do not use towel bars as grab bars.  Use non-skid mats or decals in the tub or shower.  If you need to sit down in the shower, use a plastic,  non-slip stool.  Keep the floor dry. Clean up any water that spills on the floor as soon as it happens.  Remove soap buildup in the tub or shower regularly.  Attach bath mats securely with double-sided non-slip rug tape.  Do not have throw rugs and other things on the floor that can make you trip. What can I do in the bedroom?  Use night lights.  Make sure that you have a light by your bed that is easy to reach.  Do not use any sheets or blankets that are too big for your bed. They should not hang down onto the floor.  Have a firm chair that has side arms. You can use this for support while you get dressed.  Do not have throw rugs and other things on the floor that can make you trip. What can I do in the kitchen?  Clean up any spills right away.  Avoid walking on wet floors.  Keep items that you use a lot in easy-to-reach places.  If you need to reach something above you, use a strong step stool that has a grab bar.  Keep electrical cords out of the way.  Do not use floor polish or wax that  makes floors slippery. If you must use wax, use non-skid floor wax.  Do not have throw rugs and other things on the floor that can make you trip. What can I do with my stairs?  Do not leave any items on the stairs.  Make sure that there are handrails on both sides of the stairs and use them. Fix handrails that are broken or loose. Make sure that handrails are as long as the stairways.  Check any carpeting to make sure that it is firmly attached to the stairs. Fix any carpet that is loose or worn.  Avoid having throw rugs at the top or bottom of the stairs. If you do have throw rugs, attach them to the floor with carpet tape.  Make sure that you have a light switch at the top of the stairs and the bottom of the stairs. If you do not have them, ask someone to add them for you. What else can I do to help prevent falls?  Wear shoes that:  Do not have high heels.  Have rubber  bottoms.  Are comfortable and fit you well.  Are closed at the toe. Do not wear sandals.  If you use a stepladder:  Make sure that it is fully opened. Do not climb a closed stepladder.  Make sure that both sides of the stepladder are locked into place.  Ask someone to hold it for you, if possible.  Clearly mark and make sure that you can see:  Any grab bars or handrails.  First and last steps.  Where the edge of each step is.  Use tools that help you move around (mobility aids) if they are needed. These include:  Canes.  Walkers.  Scooters.  Crutches.  Turn on the lights when you go into a dark area. Replace any light bulbs as soon as they burn out.  Set up your furniture so you have a clear path. Avoid moving your furniture around.  If any of your floors are uneven, fix them.  If there are any pets around you, be aware of where they are.  Review your medicines with your doctor. Some medicines can make you feel dizzy. This can increase your chance of falling. Ask your doctor what other things that you can do to help prevent falls. This information is not intended to replace advice given to you by your health care provider. Make sure you discuss any questions you have with your health care provider. Document Released: 06/25/2009 Document Revised: 02/04/2016 Document Reviewed: 10/03/2014 Elsevier Interactive Patient Education  2017 [employer].

## 2017-08-17 ENCOUNTER — Ambulatory Visit (INDEPENDENT_AMBULATORY_CARE_PROVIDER_SITE_OTHER): Payer: Medicare Other | Admitting: Family Medicine

## 2017-08-17 ENCOUNTER — Encounter: Payer: Self-pay | Admitting: Family Medicine

## 2017-08-17 VITALS — BP 120/60 | HR 72 | Ht 68.0 in | Wt 174.0 lb

## 2017-08-17 DIAGNOSIS — I10 Essential (primary) hypertension: Secondary | ICD-10-CM

## 2017-08-17 DIAGNOSIS — L509 Urticaria, unspecified: Secondary | ICD-10-CM

## 2017-08-17 DIAGNOSIS — K219 Gastro-esophageal reflux disease without esophagitis: Secondary | ICD-10-CM | POA: Diagnosis not present

## 2017-08-17 DIAGNOSIS — E7849 Other hyperlipidemia: Secondary | ICD-10-CM | POA: Diagnosis not present

## 2017-08-17 MED ORDER — FISH OIL 1000 MG PO CAPS: 1.0000 | ORAL_CAPSULE | Freq: Two times a day (BID) | ORAL | 11 refills | Status: DC

## 2017-08-17 MED ORDER — RANITIDINE HCL 300 MG PO TABS: 300.0000 mg | ORAL_TABLET | Freq: Every day | ORAL | 2 refills | Status: DC

## 2017-08-17 MED ORDER — NIFEDIPINE ER OSMOTIC RELEASE 30 MG PO TB24: ORAL_TABLET | ORAL | 2 refills | Status: DC

## 2017-08-17 MED ORDER — LISINOPRIL 10 MG PO TABS: 10.0000 mg | ORAL_TABLET | Freq: Every day | ORAL | 2 refills | Status: DC

## 2017-08-17 MED ORDER — SIMVASTATIN 20 MG PO TABS: 20.0000 mg | ORAL_TABLET | Freq: Every day | ORAL | 2 refills | Status: DC

## 2017-08-17 NOTE — Patient Instructions (Signed)
Stinging Nettle Stinging nettle is a broad-leafed plant that can cause burning and a hive-like rash (urticaria) after it is touched. Hives are itchy, red, swollen areas of skin. This reaction is caused by hairs on the leaves of the stinging nettle plant. When you touch or make contact with the plant, these tiny hairs break off and release acid onto your skin. This causes cells in your skin to release a chemical called histamine. The combination of acid from the plant plus histamine is irritating to your skin and can cause a rash. In severe cases, the rash may be followed by swelling of other parts of your body, such as your eyes, lips, tongue, throat, or hands. What are the causes? Stinging nettle rash is caused by contact with the stinging nettle plant. What increases the risk? You are more likely to develop stinging nettle rash if:  You live in an area where stinging nettle is present.  You spend a lot of time outdoors.  What are the signs or symptoms? Symptoms of stinging nettle rash may include:  A burning or stinging sensation.  Red, itchy swelling (wheals).  Itchy, white bumps. These can last for up to 24 hours and can occur repeatedly.  Severe symptoms may include:  Muscle cramps.  Wheezing or trouble breathing.  Swelling of the face or throat.  Diarrhea.  How is this diagnosed? Stinging nettle rash may be diagnosed by medical history and physical exam. How is this treated? Treatment may include medicines to help with the itching and rash (antihistamines). Severe cases may require medicines called corticosteroids, which may be prescribed as a cream or in pill form. Follow these instructions at home:  Take medicines only as directed by your health care provider.  Do not drive or drink alcohol if you are prescribed an antihistamine that makes you drowsy.  Do not wear tight-fitting clothes. This can irritate your skin.  Do not scratch the rash.  Apply cold cloths  (compresses) to the affected areas or take baths in cool water. This helps with itching. Avoid hot baths and showers.  Apply calamine lotion or aloe vera to the rash as directed by your health care provider. This may help with the itching.  Keep all follow-up visits as directed by your health care provider. This is important. How is this prevented?  Learn what stinging nettle looks like and avoid making contact with the plant.  If you have made contact with stinging nettle, wash the affected area with water as soon as you are able.  If you are spending time outdoors in an area where stinging nettle is common, wear long pants, close-toed shoes, and long-sleeved shirts. Contact a health care provider if:  You have constant or severe itching that does not get better with medicine. This information is not intended to replace advice given to you by your heal

## 2017-08-17 NOTE — Progress Notes (Signed)
Name: Alfred Edwards   MRN: [id number]    DOB: 28-Jun-1946   Date:08/17/2017       Progress Note  Subjective  Chief Complaint  Chief Complaint  Patient presents with  . Hyperlipidemia  . Hypertension  . Gastroesophageal Reflux    Hyperlipidemia  This is a chronic problem. The current episode started more than 1 year ago. The problem is controlled. Recent lipid tests were reviewed and are normal. He has no history of chronic renal disease, diabetes, hypothyroidism, liver disease, obesity or nephrotic syndrome. There are no known factors aggravating his hyperlipidemia. Pertinent negatives include no chest pain, focal sensory loss, focal weakness, leg pain, myalgias or shortness of breath. Current antihyperlipidemic treatment includes statins (omega 3). The current treatment provides moderate improvement of lipids. There are no compliance problems.  Risk factors for coronary artery disease include dyslipidemia, hypertension and male sex.  Hypertension  This is a chronic problem. The current episode started in the past 7 days. The problem has been gradually improving since onset. The problem is controlled. Pertinent negatives include no anxiety, blurred vision, chest pain, headaches, malaise/fatigue, neck pain, orthopnea, palpitations, peripheral edema, PND, shortness of breath or sweats. There are no associated agents to hypertension. Past treatments include calcium channel blockers and ACE inhibitors. The current treatment provides moderate improvement. There are no compliance problems.  There is no history of angina, kidney disease, CAD/MI, CVA, heart failure, left ventricular hypertrophy, PVD or retinopathy. There is no history of chronic renal disease, a hypertension causing med or renovascular disease.  Gastroesophageal Reflux  He reports no abdominal pain, no chest pain, no coughing, no heartburn, no hoarse voice, no nausea, no sore throat or no wheezing. This is a recurrent problem. The  problem has been gradually improving. The symptoms are aggravated by certain foods. Pertinent negatives include no anemia, fatigue, melena, muscle weakness, orthopnea or weight loss. He has tried a histamine-2 antagonist for the symptoms. The treatment provided moderate relief.    No problem-specific Assessment & Plan notes found for this encounter.   Past Medical History:  Diagnosis Date  . BPH (benign prostatic hypertrophy)   . GERD (gastroesophageal reflux disease)   . Hyperlipidemia   . Hypertension     Past Surgical History:  Procedure Laterality Date  . COLONOSCOPY  2005, 01/2015   Dr Vira Agar- cleared for 3 yrs  . TONSILLECTOMY    . UPPER GASTROINTESTINAL ENDOSCOPY  01/2015   Dr Vira Agar- erosions on esophagus    Family History  Problem Relation Age of Onset  . Heart disease Father   . Heart disease Brother     Social History   Socioeconomic History  . Marital status: Widowed    Spouse name: Not on file  . Number of children: Not on file  . Years of education: Not on file  . Highest education level: Bachelor's degree (e.g., BA, AB, BS)  Social Needs  . Financial resource strain: Not hard at all  . Food insecurity - worry: Never true  . Food insecurity - inability: Never true  . Transportation needs - medical: No  . Transportation needs - non-medical: No  Occupational History  . Occupation: Retired  Tobacco Use  . Smoking status: Former Research scientist (life sciences)  . Smokeless tobacco: Never Used  Substance and Sexual Activity  . Alcohol use: Yes    Alcohol/week: 0.0 oz    Comment: socially  . Drug use: No  . Sexual activity: Yes  Other Topics Concern  . Not on file  Social History Narrative  . Not on file    No Known Allergies  Outpatient Medications Prior to Visit  Medication Sig Dispense Refill  . Ascorbic Acid (VITAMIN C) 500 MG CAPS Take 1 capsule by mouth 2 (two) times daily.     Marland Kitchen aspirin 81 MG tablet Take 1 tablet by mouth daily.    . Cholecalciferol (VITAMIN  D3) 2000 UNITS capsule Take 1 capsule by mouth daily.    . Coenzyme Q10 (COQ-10) 100 MG CAPS Take 1 capsule by mouth daily.    Marland Kitchen dutasteride (AVODART) 0.5 MG capsule Take 1 capsule 3 (three) times a week by mouth. Stoioff    . Multiple Vitamins-Minerals (CENTRUM SILVER ADULT 50+ PO) Take 1 tablet by mouth daily.    . sildenafil (REVATIO) 20 MG tablet Take 20 tablets by mouth as needed. Dr Bernardo Heater    . Zinc 25 MG TABS Take 1 tablet by mouth daily.    Marland Kitchen lisinopril (PRINIVIL,ZESTRIL) 10 MG tablet Take 1 tablet (10 mg total) by mouth daily. 90 tablet 1  . NIFEdipine (PROCARDIA-XL/ADALAT-CC/NIFEDICAL-XL) 30 MG 24 hr tablet TAKE 1 TABLET (30 MG TOTAL) BY MOUTH DAILY. 90 tablet 1  . Omega-3 Fatty Acids (FISH OIL) 1000 MG CAPS Take 1 capsule (1,000 mg total) by mouth 2 (two) times daily. 30 capsule 11  . ranitidine (ZANTAC) 300 MG tablet Take 1 tablet (300 mg total) by mouth at bedtime. 90 tablet 2  . simvastatin (ZOCOR) 20 MG tablet Take 1 tablet (20 mg total) by mouth daily. 90 tablet 1   No facility-administered medications prior to visit.     Review of Systems  Constitutional: Negative for chills, fatigue, fever, malaise/fatigue and weight loss.  HENT: Negative for ear discharge, ear pain, hoarse voice and sore throat.   Eyes: Negative for blurred vision.  Respiratory: Negative for cough, sputum production, shortness of breath and wheezing.   Cardiovascular: Negative for chest pain, palpitations, orthopnea, leg swelling and PND.  Gastrointestinal: Negative for abdominal pain, blood in stool, constipation, diarrhea, heartburn, melena and nausea.  Genitourinary: Negative for dysuria, frequency, hematuria and urgency.  Musculoskeletal: Negative for back pain, joint pain, myalgias, muscle weakness and neck pain.  Skin: Negative for rash.  Neurological: Negative for dizziness, tingling, sensory change, focal weakness and headaches.  Endo/Heme/Allergies: Negative for environmental allergies and  polydipsia. Does not bruise/bleed easily.  Psychiatric/Behavioral: Negative for depression and suicidal ideas. The patient is not nervous/anxious and does not have insomnia.      Objective  Vitals:   08/17/17 0811  BP: 120/60  Pulse: 72  Weight: 174 lb (78.9 kg)  Height: 5\' 8"  (1.727 m)    Physical Exam  Constitutional: He is oriented to person, place, and time and well-developed, well-nourished, and in no distress.  HENT:  Head: Normocephalic.  Right Ear: Tympanic membrane and external ear normal.  Left Ear: Tympanic membrane and external ear normal.  Nose: Nose normal.  Mouth/Throat: Oropharynx is clear and moist.  Eyes: Conjunctivae and EOM are normal. Pupils are equal, round, and reactive to light. Right eye exhibits no discharge. Left eye exhibits no discharge. No scleral icterus.  Neck: Normal range of motion. Neck supple. No JVD present. No tracheal deviation present. No thyromegaly present.  Cardiovascular: Normal rate, regular rhythm, normal heart sounds and intact distal pulses. Exam reveals no gallop and no friction rub.  No murmur heard. Pulmonary/Chest: Breath sounds normal. No respiratory distress. He has no wheezes. He has no rales.  Abdominal: Soft. Bowel sounds  are normal. He exhibits no mass. There is no hepatosplenomegaly. There is no tenderness. There is no rebound, no guarding and no CVA tenderness.  Musculoskeletal: Normal range of motion. He exhibits no edema or tenderness.  Lymphadenopathy:    He has no cervical adenopathy.  Neurological: He is alert and oriented to person, place, and time. He has normal sensation, normal strength, normal reflexes and intact cranial nerves. No cranial nerve deficit.  Skin: Skin is warm. No rash noted.  Psychiatric: Mood and affect normal.  Nursing note and vitals reviewed.     Assessment & Plan  Problem List Items Addressed This Visit      Cardiovascular and Mediastinum   Essential (primary) hypertension - Primary    Relevant Medications   NIFEdipine (PROCARDIA-XL/ADALAT-CC/NIFEDICAL-XL) 30 MG 24 hr tablet   lisinopril (PRINIVIL,ZESTRIL) 10 MG tablet   simvastatin (ZOCOR) 20 MG tablet   Other Relevant Orders   Renal function panel     Digestive   Gastroesophageal reflux disease without esophagitis   Relevant Medications   ranitidine (ZANTAC) 300 MG tablet     Other   Familial multiple lipoprotein-type hyperlipidemia   Relevant Medications   NIFEdipine (PROCARDIA-XL/ADALAT-CC/NIFEDICAL-XL) 30 MG 24 hr tablet   lisinopril (PRINIVIL,ZESTRIL) 10 MG tablet   Omega-3 Fatty Acids (FISH OIL) 1000 MG CAPS   simvastatin (ZOCOR) 20 MG tablet   Other Relevant Orders   Lipid panel    Other Visit Diagnoses    Urticaria          Meds ordered this encounter  Medications  . NIFEdipine (PROCARDIA-XL/ADALAT-CC/NIFEDICAL-XL) 30 MG 24 hr tablet    Sig: TAKE 1 TABLET (30 MG TOTAL) BY MOUTH DAILY.    Dispense:  90 tablet    Refill:  2  . lisinopril (PRINIVIL,ZESTRIL) 10 MG tablet    Sig: Take 1 tablet (10 mg total) by mouth daily.    Dispense:  90 tablet    Refill:  2  . Omega-3 Fatty Acids (FISH OIL) 1000 MG CAPS    Sig: Take 1 capsule (1,000 mg total) by mouth 2 (two) times daily.    Dispense:  30 capsule    Refill:  11  . simvastatin (ZOCOR) 20 MG tablet    Sig: Take 1 tablet (20 mg total) by mouth daily.    Dispense:  90 tablet    Refill:  2  . ranitidine (ZANTAC) 300 MG tablet    Sig: Take 1 tablet (300 mg total) by mouth at bedtime.    Dispense:  90 tablet    Refill:  2      Dr. Otilio Miu [facility]  08/17/17

## 2017-08-18 LAB — RENAL FUNCTION PANEL
Albumin: 4 g/dL (ref 3.6–4.8)
BUN/Creatinine Ratio: 20 (ref 10–24)
BUN: 21 mg/dL (ref 8–27)
CALCIUM: 9.5 mg/dL (ref 8.6–10.2)
CHLORIDE: 105 mmol/L (ref 96–106)
CO2: 24 mmol/L (ref 20–29)
CREATININE: 1.05 mg/dL (ref 0.76–1.27)
GFR calc Af Amer: 83 mL/min/{1.73_m2} (ref >59)
GFR calc non Af Amer: 72 mL/min/{1.73_m2} (ref >59)
Glucose: 94 mg/dL (ref 65–99)
PHOSPHORUS: 3.8 mg/dL (ref 2.5–4.5)
Potassium: 5.7 mmol/L — ABNORMAL HIGH (ref 3.5–5.2)
Sodium: 143 mmol/L (ref 134–144)

## 2017-08-18 LAB — LIPID PANEL
CHOLESTEROL TOTAL: 127 mg/dL (ref 100–199)
Chol/HDL Ratio: 1.5 ratio (ref 0.0–5.0)
HDL: 86 mg/dL (ref >39)
LDL CALC: 34 mg/dL (ref 0–99)
TRIGLYCERIDES: 36 mg/dL (ref 0–149)
VLDL CHOLESTEROL CAL: 7 mg/dL (ref 5–40)

## 2017-08-24 DIAGNOSIS — L2089 Other atopic dermatitis: Secondary | ICD-10-CM | POA: Diagnosis not present

## 2017-08-28 ENCOUNTER — Ambulatory Visit: Payer: Medicare Other | Admitting: Urology

## 2017-08-28 ENCOUNTER — Encounter: Payer: Self-pay | Admitting: Urology

## 2017-08-28 VITALS — BP 129/68 | HR 65 | Ht 68.0 in | Wt 179.0 lb

## 2017-08-28 DIAGNOSIS — R972 Elevated prostate specific antigen [PSA]: Secondary | ICD-10-CM

## 2017-08-28 DIAGNOSIS — N529 Male erectile dysfunction, unspecified: Secondary | ICD-10-CM | POA: Diagnosis not present

## 2017-08-28 DIAGNOSIS — N401 Enlarged prostate with lower urinary tract symptoms: Secondary | ICD-10-CM | POA: Diagnosis not present

## 2017-08-28 HISTORY — DX: Elevated prostate specific antigen (PSA): R97.20

## 2017-08-28 MED ORDER — SILDENAFIL CITRATE 20 MG PO TABS: ORAL_TABLET | ORAL | 2 refills | Status: DC

## 2017-08-28 NOTE — Progress Notes (Signed)
08/28/2017 10:29 AM   Alfred Edwards 09/28/1945 [id number]  Referring provider: Juline Patch, MD [address] [place], [place] [address]  Chief Complaint  Patient presents with  . Elevated PSA    1year follow up    HPI: 71 year old male previously sent for an elevated PSA.  Prostate biopsy was performed August 2008 for a PSA of 6.2 with benign pathology.  Prostate volume was calculated at 52 cc he has moderate lower urinary tract symptoms and is taking dutasteride 3 times weekly.  Denies dysuria or gross hematuria.  Denies flank, abdominal, pelvic or scrotal pain.  He is using generic sildenafil for erectile dysfunction with good results.   PMH: Past Medical History:  Diagnosis Date  . BPH (benign prostatic hypertrophy)   . GERD (gastroesophageal reflux disease)   . Hyperlipidemia   . Hypertension     Surgical History: Past Surgical History:  Procedure Laterality Date  . COLONOSCOPY  2005, 01/2015   Dr Vira Agar- cleared for 3 yrs  . TONSILLECTOMY    . UPPER GASTROINTESTINAL ENDOSCOPY  01/2015   Dr Vira Agar- erosions on esophagus    Home Medications:  Allergies as of 08/28/2017   No Known Allergies     Medication List        Accurate as of 08/28/17 10:29 AM. Always use your most recent med list.          aspirin 81 MG tablet Take 1 tablet by mouth daily.   CENTRUM SILVER ADULT 50+ PO Take 1 tablet by mouth daily.   CoQ-10 100 MG Caps Take 1 capsule by mouth daily.   dutasteride 0.5 MG capsule Commonly known as:  AVODART Take 1 capsule 3 (three) times a week by mouth. Cressie Betzler   Fish Oil 1000 MG Caps Take 1 capsule (1,000 mg total) by mouth 2 (two) times daily.   lisinopril 10 MG tablet Commonly known as:  PRINIVIL,ZESTRIL Take 1 tablet (10 mg total) by mouth daily.   NIFEdipine 30 MG 24 hr tablet Commonly known as:  PROCARDIA-XL/ADALAT-CC/NIFEDICAL-XL TAKE 1 TABLET (30 MG TOTAL) BY MOUTH DAILY.   ranitidine 300 MG  tablet Commonly known as:  ZANTAC Take 1 tablet (300 mg total) by mouth at bedtime.   sildenafil 20 MG tablet Commonly known as:  REVATIO Take 20 tablets by mouth as needed. Dr Bernardo Heater   simvastatin 20 MG tablet Commonly known as:  ZOCOR Take 1 tablet (20 mg total) by mouth daily.   Vitamin C 500 MG Caps Take 1 capsule by mouth 2 (two) times daily.   Vitamin D3 2000 units capsule Take 1 capsule by mouth daily.   Zinc 25 MG Tabs Take 1 tablet by mouth daily.       Allergies: No Known Allergies  Family History: Family History  Problem Relation Age of Onset  . Heart disease Father   . Heart disease Brother     Social History:  reports that he has quit smoking. he has never used smokeless tobacco. He reports that he drinks alcohol. He reports that he does not use drugs.  ROS: UROLOGY Frequent Urination?: No Hard to postpone urination?: No Burning/pain with urination?: No Get up at night to urinate?: Yes Leakage of urine?: No Urine stream starts and stops?: No Trouble starting stream?: No Do you have to strain to urinate?: No Blood in urine?: No Urinary tract infection?: No Sexually transmitted disease?: No Injury to kidneys or bladder?: No Painful intercourse?: No Weak stream?: No Erection  problems?: Yes Penile pain?: No  Gastrointestinal Nausea?: No Vomiting?: No Indigestion/heartburn?: No Diarrhea?: No Constipation?: No  Constitutional Fever: No Night sweats?: No Weight loss?: No Fatigue?: No  Skin Skin rash/lesions?: No Itching?: No  Eyes Blurred vision?: No Double vision?: No  Ears/Nose/Throat Sore throat?: No Sinus problems?: No  Hematologic/Lymphatic Swollen glands?: No Easy bruising?: No  Cardiovascular Leg swelling?: No Chest pain?: No  Respiratory Cough?: No Shortness of breath?: No  Endocrine Excessive thirst?: No  Musculoskeletal Back pain?: No Joint pain?: No  Neurological Headaches?: No Dizziness?:  No  Psychologic Depression?: No Anxiety?: No  Physical Exam: BP 129/68   Pulse 65   Ht 5\' 8"  (1.727 m)   Wt 179 lb (81.2 kg)   BMI 27.22 kg/m   Constitutional:  Alert and oriented, No acute distress. HEENT: [place] AT, moist mucus membranes.  Trachea midline, no masses. Cardiovascular: No clubbing, cyanosis, or edema. Respiratory: Normal respiratory effort, no increased work of breathing. GI: Abdomen is soft, nontender, nondistended, no abdominal masses GU: No CVA tenderness.  Prostate 60 g, smooth without nodules Skin: No rashes, bruises or suspicious lesions. Lymph: No cervical or inguinal adenopathy. Neurologic: Grossly intact, no focal deficits, moving all 4 extremities. Psychiatric: Normal mood and affect.  Laboratory Data:  Lab Results  Component Value Date   CREATININE 1.05 08/17/2017    Lab Results  Component Value Date   PSA1 2.3 12/28/2015     Assessment & Plan:    1. Elevated PSA PSA drawn and if stable he will continue annual follow-up.    - PSA  2. BPH with lower urinary tract symptoms Stable.  Continue dutasteride.  3. Erectile dysfunction. Sildenafil was refilled    Abbie Sons, MD  [facility] [address] [place], [place] [address] [phone]

## 2017-08-29 LAB — PSA: PROSTATE SPECIFIC AG, SERUM: 2.4 ng/mL (ref 0.0–4.0)

## 2017-09-06 ENCOUNTER — Telehealth: Payer: Self-pay

## 2017-09-06 NOTE — Telephone Encounter (Signed)
Patient notified

## 2017-09-06 NOTE — Telephone Encounter (Signed)
-----   Message from Abbie Sons, MD sent at 09/06/2017  7:18 AM EST ----- PSA stable at 2.4.  Continue annual follow-up

## 2017-09-13 DIAGNOSIS — L2089 Other atopic dermatitis: Secondary | ICD-10-CM | POA: Diagnosis not present

## 2017-09-25 ENCOUNTER — Other Ambulatory Visit: Payer: Medicare Other

## 2017-09-25 DIAGNOSIS — E875 Hyperkalemia: Secondary | ICD-10-CM | POA: Diagnosis not present

## 2017-09-26 LAB — POTASSIUM: POTASSIUM: 5.3 mmol/L — AB (ref 3.5–5.2)

## 2017-12-28 DIAGNOSIS — Z86010 Personal history of colonic polyps: Secondary | ICD-10-CM | POA: Diagnosis not present

## 2017-12-28 DIAGNOSIS — K219 Gastro-esophageal reflux disease without esophagitis: Secondary | ICD-10-CM | POA: Diagnosis not present

## 2018-01-31 DIAGNOSIS — Z872 Personal history of diseases of the skin and subcutaneous tissue: Secondary | ICD-10-CM | POA: Diagnosis not present

## 2018-01-31 DIAGNOSIS — Z86018 Personal history of other benign neoplasm: Secondary | ICD-10-CM | POA: Diagnosis not present

## 2018-01-31 DIAGNOSIS — L578 Other skin changes due to chronic exposure to nonionizing radiation: Secondary | ICD-10-CM | POA: Diagnosis not present

## 2018-01-31 DIAGNOSIS — L570 Actinic keratosis: Secondary | ICD-10-CM | POA: Diagnosis not present

## 2018-02-13 DIAGNOSIS — Z86010 Personal history of colonic polyps: Secondary | ICD-10-CM | POA: Diagnosis not present

## 2018-02-13 DIAGNOSIS — K648 Other hemorrhoids: Secondary | ICD-10-CM | POA: Diagnosis not present

## 2018-02-13 DIAGNOSIS — K5730 Diverticulosis of large intestine without perforation or abscess without bleeding: Secondary | ICD-10-CM | POA: Diagnosis not present

## 2018-02-13 DIAGNOSIS — K640 First degree hemorrhoids: Secondary | ICD-10-CM | POA: Diagnosis not present

## 2018-02-13 LAB — HM COLONOSCOPY

## 2018-02-21 ENCOUNTER — Encounter: Payer: Self-pay | Admitting: Family Medicine

## 2018-02-21 ENCOUNTER — Ambulatory Visit (INDEPENDENT_AMBULATORY_CARE_PROVIDER_SITE_OTHER): Payer: Medicare Other | Admitting: Family Medicine

## 2018-02-21 VITALS — BP 130/70 | HR 72 | Ht 69.0 in | Wt 168.0 lb

## 2018-02-21 DIAGNOSIS — E875 Hyperkalemia: Secondary | ICD-10-CM | POA: Diagnosis not present

## 2018-02-21 DIAGNOSIS — E7849 Other hyperlipidemia: Secondary | ICD-10-CM | POA: Diagnosis not present

## 2018-02-21 DIAGNOSIS — I10 Essential (primary) hypertension: Secondary | ICD-10-CM | POA: Diagnosis not present

## 2018-02-21 MED ORDER — FISH OIL 1000 MG PO CAPS: 1.0000 | ORAL_CAPSULE | Freq: Two times a day (BID) | ORAL | 11 refills | Status: DC

## 2018-02-21 MED ORDER — SIMVASTATIN 20 MG PO TABS: 20.0000 mg | ORAL_TABLET | Freq: Every day | ORAL | 2 refills | Status: DC

## 2018-02-21 MED ORDER — NIFEDIPINE ER OSMOTIC RELEASE 30 MG PO TB24: ORAL_TABLET | ORAL | 2 refills | Status: DC

## 2018-02-21 MED ORDER — LISINOPRIL 10 MG PO TABS: 10.0000 mg | ORAL_TABLET | Freq: Every day | ORAL | 2 refills | Status: DC

## 2018-02-21 NOTE — Assessment & Plan Note (Signed)
Stable on meds- continue as prescribed 

## 2018-02-21 NOTE — Assessment & Plan Note (Signed)
Stable on meds- continue

## 2018-02-21 NOTE — Progress Notes (Signed)
Name: Alfred Edwards   MRN: [id number]    DOB: 1945-10-31   Date:02/21/2018       Progress Note  Subjective  Chief Complaint  Chief Complaint  Patient presents with  . Hyperlipidemia  . Hypertension    Hyperlipidemia  This is a chronic problem. The current episode started more than 1 year ago. The problem is controlled. Recent lipid tests were reviewed and are normal. He has no history of chronic renal disease, diabetes, hypothyroidism, liver disease, obesity or nephrotic syndrome. There are no known factors aggravating his hyperlipidemia. Pertinent negatives include no chest pain, focal sensory loss, focal weakness, leg pain, myalgias or shortness of breath. Current antihyperlipidemic treatment includes statins. The current treatment provides mild improvement of lipids. There are no compliance problems.  Risk factors for coronary artery disease include dyslipidemia.  Hypertension  This is a chronic problem. The current episode started more than 1 year ago. The problem is unchanged. The problem is controlled. Pertinent negatives include no anxiety, blurred vision, chest pain, headaches, malaise/fatigue, neck pain, orthopnea, palpitations, peripheral edema, PND, shortness of breath or sweats. There are no associated agents to hypertension. Risk factors for coronary artery disease include dyslipidemia and diabetes mellitus. Past treatments include calcium channel blockers and ACE inhibitors. The current treatment provides moderate improvement. There are no compliance problems.  There is no history of angina, kidney disease, CAD/MI, CVA, heart failure, left ventricular hypertrophy, PVD or retinopathy. There is no history of chronic renal disease, a hypertension causing med or renovascular disease.    Essential (primary) hypertension Stable on meds- continue  Familial multiple lipoprotein-type hyperlipidemia Stable on meds- continue as prescribed   Past Medical History:  Diagnosis Date  .  BPH (benign prostatic hypertrophy)   . GERD (gastroesophageal reflux disease)   . Hyperlipidemia   . Hypertension     Past Surgical History:  Procedure Laterality Date  . COLONOSCOPY  2005, 01/2015   Dr Vira Agar- cleared for 3 yrs  . TONSILLECTOMY    . UPPER GASTROINTESTINAL ENDOSCOPY  01/2015   Dr Vira Agar- erosions on esophagus    Family History  Problem Relation Age of Onset  . Heart disease Father   . Heart disease Brother     Social History   Socioeconomic History  . Marital status: Widowed    Spouse name: Not on file  . Number of children: Not on file  . Years of education: Not on file  . Highest education level: Bachelor's degree (e.g., BA, AB, BS)  Occupational History  . Occupation: Retired  Scientific laboratory technician  . Financial resource strain: Not hard at all  . Food insecurity:    Worry: Never true    Inability: Never true  . Transportation needs:    Medical: No    Non-medical: No  Tobacco Use  . Smoking status: Former Research scientist (life sciences)  . Smokeless tobacco: Never Used  Substance and Sexual Activity  . Alcohol use: Yes    Alcohol/week: 0.0 oz    Comment: socially  . Drug use: No  . Sexual activity: Yes  Lifestyle  . Physical activity:    Days per week: 3 days    Minutes per session: 60 min  . Stress: Only a little  Relationships  . Social connections:    Talks on phone: Not on file    Gets together: Not on file    Attends religious service: Not on file    Active member of club or organization: Not on file    Attends  meetings of clubs or organizations: Not on file    Relationship status: Not on file  . Intimate partner violence:    Fear of current or ex partner: Not on file    Emotionally abused: Not on file    Physically abused: Not on file    Forced sexual activity: Not on file  Other Topics Concern  . Not on file  Social History Narrative  . Not on file    No Known Allergies  Outpatient Medications Prior to Visit  Medication Sig Dispense Refill  .  Ascorbic Acid (VITAMIN C) 500 MG CAPS Take 1 capsule by mouth 2 (two) times daily.     Marland Kitchen aspirin 81 MG tablet Take 1 tablet by mouth daily.    . Cholecalciferol (VITAMIN D3) 2000 UNITS capsule Take 1 capsule by mouth daily.    . Coenzyme Q10 (COQ-10) 100 MG CAPS Take 1 capsule by mouth daily.    Marland Kitchen dutasteride (AVODART) 0.5 MG capsule Take 1 capsule 3 (three) times a week by mouth. Stoioff    . Multiple Vitamins-Minerals (CENTRUM SILVER ADULT 50+ PO) Take 1 tablet by mouth daily.    . sildenafil (REVATIO) 20 MG tablet 1-5 tabs as needed 1 hour prior to intercourse 30 tablet 2  . Zinc 25 MG TABS Take 1 tablet by mouth daily.    Marland Kitchen lisinopril (PRINIVIL,ZESTRIL) 10 MG tablet Take 1 tablet (10 mg total) by mouth daily. 90 tablet 2  . NIFEdipine (PROCARDIA-XL/ADALAT-CC/NIFEDICAL-XL) 30 MG 24 hr tablet TAKE 1 TABLET (30 MG TOTAL) BY MOUTH DAILY. 90 tablet 2  . Omega-3 Fatty Acids (FISH OIL) 1000 MG CAPS Take 1 capsule (1,000 mg total) by mouth 2 (two) times daily. 30 capsule 11  . simvastatin (ZOCOR) 20 MG tablet Take 1 tablet (20 mg total) by mouth daily. 90 tablet 2  . ranitidine (ZANTAC) 300 MG tablet Take 1 tablet (300 mg total) by mouth at bedtime. 90 tablet 2   No facility-administered medications prior to visit.     Review of Systems  Constitutional: Negative for chills, fever, malaise/fatigue and weight loss.  HENT: Negative for ear discharge, ear pain and sore throat.   Eyes: Negative for blurred vision.  Respiratory: Negative for cough, sputum production, shortness of breath and wheezing.   Cardiovascular: Negative for chest pain, palpitations, orthopnea, leg swelling and PND.  Gastrointestinal: Negative for abdominal pain, blood in stool, constipation, diarrhea, heartburn, melena and nausea.  Genitourinary: Negative for dysuria, frequency, hematuria and urgency.  Musculoskeletal: Negative for back pain, joint pain, myalgias and neck pain.  Skin: Negative for rash.  Neurological:  Negative for dizziness, tingling, sensory change, focal weakness and headaches.  Endo/Heme/Allergies: Negative for environmental allergies and polydipsia. Does not bruise/bleed easily.  Psychiatric/Behavioral: Negative for depression and suicidal ideas. The patient is not nervous/anxious and does not have insomnia.      Objective  Vitals:   02/21/18 0938  BP: 130/70  Pulse: 72  Weight: 168 lb (76.2 kg)  Height: 5\' 9"  (1.753 m)    Physical Exam  Constitutional: He is oriented to person, place, and time.  HENT:  Head: Normocephalic.  Right Ear: External ear normal.  Left Ear: External ear normal.  Nose: Nose normal.  Mouth/Throat: Oropharynx is clear and moist.  Eyes: Pupils are equal, round, and reactive to light. Conjunctivae and EOM are normal. Right eye exhibits no discharge. Left eye exhibits no discharge. No scleral icterus.  Neck: Normal range of motion. Neck supple. No JVD present. No  tracheal deviation present. No thyromegaly present.  Cardiovascular: Normal rate, regular rhythm, normal heart sounds and intact distal pulses. Exam reveals no gallop and no friction rub.  No murmur heard. Pulmonary/Chest: Breath sounds normal. No respiratory distress. He has no wheezes. He has no rales.  Abdominal: Soft. Bowel sounds are normal. He exhibits no mass. There is no hepatosplenomegaly. There is no tenderness. There is no rebound, no guarding and no CVA tenderness.  Musculoskeletal: Normal range of motion. He exhibits no edema or tenderness.  Lymphadenopathy:    He has no cervical adenopathy.  Neurological: He is alert and oriented to person, place, and time. He has normal strength and normal reflexes. No cranial nerve deficit.  Skin: Skin is warm. No rash noted.  Nursing note and vitals reviewed.     Assessment & Plan  Problem List Items Addressed This Visit      Cardiovascular and Mediastinum   Essential (primary) hypertension - Primary    Stable on meds- continue       Relevant Medications   NIFEdipine (PROCARDIA-XL/ADALAT-CC/NIFEDICAL-XL) 30 MG 24 hr tablet   lisinopril (PRINIVIL,ZESTRIL) 10 MG tablet   simvastatin (ZOCOR) 20 MG tablet   Other Relevant Orders   Renal Function Panel     Other   Familial multiple lipoprotein-type hyperlipidemia    Stable on meds- continue as prescribed      Relevant Medications   NIFEdipine (PROCARDIA-XL/ADALAT-CC/NIFEDICAL-XL) 30 MG 24 hr tablet   lisinopril (PRINIVIL,ZESTRIL) 10 MG tablet   simvastatin (ZOCOR) 20 MG tablet   Omega-3 Fatty Acids (FISH OIL) 1000 MG CAPS    Other Visit Diagnoses    Hyperkalemia       check renal panel   Relevant Orders   Renal Function Panel      Meds ordered this encounter  Medications  . NIFEdipine (PROCARDIA-XL/ADALAT-CC/NIFEDICAL-XL) 30 MG 24 hr tablet    Sig: TAKE 1 TABLET (30 MG TOTAL) BY MOUTH DAILY.    Dispense:  90 tablet    Refill:  2  . lisinopril (PRINIVIL,ZESTRIL) 10 MG tablet    Sig: Take 1 tablet (10 mg total) by mouth daily.    Dispense:  90 tablet    Refill:  2  . simvastatin (ZOCOR) 20 MG tablet    Sig: Take 1 tablet (20 mg total) by mouth daily.    Dispense:  90 tablet    Refill:  2  . Omega-3 Fatty Acids (FISH OIL) 1000 MG CAPS    Sig: Take 1 capsule (1,000 mg total) by mouth 2 (two) times daily.    Dispense:  30 capsule    Refill:  11      Dr. Jleigh [facility]  02/21/18

## 2018-02-22 LAB — RENAL FUNCTION PANEL
ALBUMIN: 4.1 g/dL (ref 3.6–4.8)
BUN/Creatinine Ratio: 19 (ref 10–24)
BUN: 18 mg/dL (ref 8–27)
CALCIUM: 9.3 mg/dL (ref 8.6–10.2)
CO2: 22 mmol/L (ref 20–29)
CREATININE: 0.96 mg/dL (ref 0.76–1.27)
Chloride: 103 mmol/L (ref 96–106)
GFR calc Af Amer: 93 mL/min/{1.73_m2} (ref >59)
GFR, EST NON AFRICAN AMERICAN: 80 mL/min/{1.73_m2} (ref >59)
GLUCOSE: 77 mg/dL (ref 65–99)
PHOSPHORUS: 3 mg/dL (ref 2.5–4.5)
POTASSIUM: 5.4 mmol/L — AB (ref 3.5–5.2)
SODIUM: 143 mmol/L (ref 134–144)

## 2018-06-12 ENCOUNTER — Other Ambulatory Visit: Payer: Self-pay | Admitting: Family Medicine

## 2018-06-12 MED ORDER — SILDENAFIL CITRATE 20 MG PO TABS: ORAL_TABLET | ORAL | 0 refills | Status: DC

## 2018-08-22 ENCOUNTER — Encounter: Payer: Self-pay | Admitting: Family Medicine

## 2018-08-22 ENCOUNTER — Ambulatory Visit (INDEPENDENT_AMBULATORY_CARE_PROVIDER_SITE_OTHER): Payer: Medicare Other | Admitting: Family Medicine

## 2018-08-22 VITALS — BP 128/68 | HR 72 | Ht 69.0 in | Wt 172.0 lb

## 2018-08-22 DIAGNOSIS — R69 Illness, unspecified: Secondary | ICD-10-CM | POA: Diagnosis not present

## 2018-08-22 DIAGNOSIS — I10 Essential (primary) hypertension: Secondary | ICD-10-CM

## 2018-08-22 DIAGNOSIS — E7849 Other hyperlipidemia: Secondary | ICD-10-CM

## 2018-08-22 DIAGNOSIS — Z23 Encounter for immunization: Secondary | ICD-10-CM | POA: Diagnosis not present

## 2018-08-22 MED ORDER — FISH OIL 1000 MG PO CAPS: 1.0000 | ORAL_CAPSULE | Freq: Two times a day (BID) | ORAL | 11 refills | Status: DC

## 2018-08-22 MED ORDER — LISINOPRIL 10 MG PO TABS: 10.0000 mg | ORAL_TABLET | Freq: Every day | ORAL | 1 refills | Status: DC

## 2018-08-22 MED ORDER — NIFEDIPINE ER OSMOTIC RELEASE 30 MG PO TB24: ORAL_TABLET | ORAL | 1 refills | Status: DC

## 2018-08-22 MED ORDER — SIMVASTATIN 20 MG PO TABS: 20.0000 mg | ORAL_TABLET | Freq: Every day | ORAL | 1 refills | Status: DC

## 2018-08-22 NOTE — Progress Notes (Signed)
Date:  08/22/2018   Name:  Alfred Edwards   DOB:  Jan 27, 1946   MRN:  [id number]   Chief Complaint: Hypertension and Hyperlipidemia  Hypertension  This is a chronic problem. The current episode started more than 1 year ago. The problem is unchanged. The problem is controlled. Pertinent negatives include no anxiety, blurred vision, chest pain, headaches, malaise/fatigue, neck pain, orthopnea, palpitations, peripheral edema, PND, shortness of breath or sweats. There are no associated agents to hypertension. Risk factors for coronary artery disease include diabetes mellitus, dyslipidemia, family history, hyperhomocysteinemia, male gender, obesity, post-menopausal state, stress, smoking/tobacco exposure and sedentary lifestyle. Past treatments include ACE inhibitors and calcium channel blockers. The current treatment provides mild improvement. There are no compliance problems.  There is no history of angina, kidney disease, CAD/MI, CVA, heart failure, left ventricular hypertrophy, PVD or retinopathy. There is no history of chronic renal disease, a hypertension causing med or renovascular disease.  Hyperlipidemia  This is a chronic problem. The current episode started more than 1 year ago. The problem is controlled. Recent lipid tests were reviewed and are normal. He has no history of chronic renal disease. Pertinent negatives include no chest pain, myalgias or shortness of breath. Current antihyperlipidemic treatment includes statins. The current treatment provides moderate improvement of lipids. There are no compliance problems.  Risk factors for coronary artery disease include dyslipidemia, male sex and hypertension.    Review of Systems  Constitutional: Negative for chills, fever and malaise/fatigue.  HENT: Negative for drooling, ear discharge, ear pain and sore throat.   Eyes: Negative for blurred vision.  Respiratory: Negative for cough, shortness of breath and wheezing.   Cardiovascular:  Negative for chest pain, palpitations, orthopnea, leg swelling and PND.  Gastrointestinal: Negative for abdominal pain, blood in stool, constipation, diarrhea and nausea.  Endocrine: Negative for polydipsia.  Genitourinary: Negative for dysuria, frequency, hematuria and urgency.  Musculoskeletal: Negative for back pain, myalgias and neck pain.  Skin: Negative for rash.  Allergic/Immunologic: Negative for environmental allergies.  Neurological: Negative for dizziness and headaches.  Hematological: Does not bruise/bleed easily.  Psychiatric/Behavioral: Negative for suicidal ideas. The patient is not nervous/anxious.     Patient Active Problem List   Diagnosis Date Noted  . Elevated PSA 08/28/2017  . Erectile dysfunction 08/28/2017  . Benign prostatic hyperplasia with lower urinary tract symptoms 08/28/2017  . Venous insufficiency 02/02/2017  . Gastroesophageal reflux disease without esophagitis 02/02/2017  . Essential hypertension 12/28/2015  . Hyperlipidemia 12/28/2015  . Nocturia associated with benign prostatic hypertrophy 12/28/2015  . Familial multiple lipoprotein-type hyperlipidemia 01/23/2015  . Anxiety disorder due to known physiological condition 01/23/2015  . Routine general medical examination at a health care facility 01/23/2015  . Adjustment reaction 01/23/2015  . Essential (primary) hypertension 01/23/2015    No Known Allergies  Past Surgical History:  Procedure Laterality Date  . COLONOSCOPY  2005, 01/2015   Dr Vira Agar- cleared for 3 yrs  . TONSILLECTOMY    . UPPER GASTROINTESTINAL ENDOSCOPY  01/2015   Dr Vira Agar- erosions on esophagus    Social History   Tobacco Use  . Smoking status: Former Research scientist (life sciences)  . Smokeless tobacco: Never Used  Substance Use Topics  . Alcohol use: Yes    Alcohol/week: 0.0 standard drinks    Comment: socially  . Drug use: No     Medication list has been reviewed and updated.  Current Meds  Medication Sig  . Ascorbic Acid  (VITAMIN C) 500 MG CAPS Take 1 capsule by  mouth 2 (two) times daily.   Marland Kitchen aspirin 81 MG tablet Take 1 tablet by mouth daily.  . Cholecalciferol (VITAMIN D3) 2000 UNITS capsule Take 1 capsule by mouth daily.  . Coenzyme Q10 (COQ-10) 100 MG CAPS Take 1 capsule by mouth daily.  Marland Kitchen dutasteride (AVODART) 0.5 MG capsule Take 1 capsule 3 (three) times a week by mouth. Stoioff  . lisinopril (PRINIVIL,ZESTRIL) 10 MG tablet Take 1 tablet (10 mg total) by mouth daily.  . Multiple Vitamins-Minerals (CENTRUM SILVER ADULT 50+ PO) Take 1 tablet by mouth daily.  Marland Kitchen NIFEdipine (PROCARDIA-XL/NIFEDICAL-XL) 30 MG 24 hr tablet TAKE 1 TABLET (30 MG TOTAL) BY MOUTH DAILY.  Marland Kitchen Omega-3 Fatty Acids (FISH OIL) 1000 MG CAPS Take 1 capsule (1,000 mg total) by mouth 2 (two) times daily.  . sildenafil (REVATIO) 20 MG tablet 1-5 tabs as needed 1 hour prior to intercourse  . simvastatin (ZOCOR) 20 MG tablet Take 1 tablet (20 mg total) by mouth daily.  . Zinc 25 MG TABS Take 1 tablet by mouth daily.  . [DISCONTINUED] lisinopril (PRINIVIL,ZESTRIL) 10 MG tablet Take 1 tablet (10 mg total) by mouth daily.  . [DISCONTINUED] NIFEdipine (PROCARDIA-XL/ADALAT-CC/NIFEDICAL-XL) 30 MG 24 hr tablet TAKE 1 TABLET (30 MG TOTAL) BY MOUTH DAILY.  . [DISCONTINUED] Omega-3 Fatty Acids (FISH OIL) 1000 MG CAPS Take 1 capsule (1,000 mg total) by mouth 2 (two) times daily.  . [DISCONTINUED] simvastatin (ZOCOR) 20 MG tablet Take 1 tablet (20 mg total) by mouth daily.    PHQ 2/9 Scores 08/22/2018 02/21/2018 07/24/2017 02/02/2017  PHQ - 2 Score 0 0 0 0  PHQ- 9 Score 0 0 - -    Physical Exam  Constitutional: He is oriented to person, place, and time.  HENT:  Head: Normocephalic.  Right Ear: External ear normal.  Left Ear: External ear normal.  Nose: Nose normal.  Mouth/Throat: Oropharynx is clear and moist.  Eyes: Pupils are equal, round, and reactive to light. Conjunctivae and EOM are normal. Right eye exhibits no discharge. Left eye exhibits no  discharge. No scleral icterus.  Neck: Normal range of motion. Neck supple. No JVD present. No tracheal deviation present. No thyromegaly present.  Cardiovascular: Normal rate, regular rhythm, normal heart sounds and intact distal pulses. Exam reveals no gallop and no friction rub.  No murmur heard. Pulmonary/Chest: Breath sounds normal. No respiratory distress. He has no wheezes. He has no rales.  Abdominal: Soft. Bowel sounds are normal. He exhibits no mass. There is no hepatosplenomegaly. There is no tenderness. There is no rebound, no guarding and no CVA tenderness.  Musculoskeletal: Normal range of motion. He exhibits no edema or tenderness.  Lymphadenopathy:    He has no cervical adenopathy.  Neurological: He is alert and oriented to person, place, and time. He has normal strength and normal reflexes. No cranial nerve deficit.  Skin: Skin is warm. No rash noted.  Nursing note and vitals reviewed.   BP 128/68   Pulse 72   Ht 5\' 9"  (1.753 m)   Wt 172 lb (78 kg)   BMI 25.40 kg/m   Assessment and Plan:  1. Essential (primary) hypertension Chronic, stable- refill lisinopril, nifedipine/ draw renal panel - Renal Function Panel - lisinopril (PRINIVIL,ZESTRIL) 10 MG tablet; Take 1 tablet (10 mg total) by mouth daily.  Dispense: 90 tablet; Refill: 1 - NIFEdipine (PROCARDIA-XL/NIFEDICAL-XL) 30 MG 24 hr tablet; TAKE 1 TABLET (30 MG TOTAL) BY MOUTH DAILY.  Dispense: 90 tablet; Refill: 1  2. Familial multiple lipoprotein-type hyperlipidemia Chronic,  stable on med- refill simvastatin and cont Omega 3 otc/ draw lipid and hepatic - Lipid Panel With LDL/HDL Ratio - simvastatin (ZOCOR) 20 MG tablet; Take 1 tablet (20 mg total) by mouth daily.  Dispense: 90 tablet; Refill: 1 - Omega-3 Fatty Acids (FISH OIL) 1000 MG CAPS; Take 1 capsule (1,000 mg total) by mouth 2 (two) times daily.  Dispense: 30 capsule; Refill: 11  3. Taking medication for chronic disease Draw hepatic due to chol meds -  Hepatic function panel  4. Need for 23-polyvalent pneumococcal polysaccharide vaccine Discussed and administered - Pneumococcal polysaccharide vaccine 23-valent greater than or equal to 2yo subcutaneous/IM  Dr. [facility]  08/22/2018

## 2018-08-23 LAB — HEPATIC FUNCTION PANEL
ALK PHOS: 52 IU/L (ref 39–117)
ALT: 21 IU/L (ref 0–44)
AST: 19 IU/L (ref 0–40)
BILIRUBIN TOTAL: 0.5 mg/dL (ref 0.0–1.2)
Bilirubin, Direct: 0.16 mg/dL (ref 0.00–0.40)
TOTAL PROTEIN: 7.3 g/dL (ref 6.0–8.5)

## 2018-08-23 LAB — RENAL FUNCTION PANEL
Albumin: 4.3 g/dL (ref 3.5–4.8)
BUN / CREAT RATIO: 15 (ref 10–24)
BUN: 16 mg/dL (ref 8–27)
CALCIUM: 9.6 mg/dL (ref 8.6–10.2)
CHLORIDE: 105 mmol/L (ref 96–106)
CO2: 22 mmol/L (ref 20–29)
Creatinine, Ser: 1.1 mg/dL (ref 0.76–1.27)
GFR calc Af Amer: 78 mL/min/{1.73_m2} (ref >59)
GFR, EST NON AFRICAN AMERICAN: 68 mL/min/{1.73_m2} (ref >59)
GLUCOSE: 99 mg/dL (ref 65–99)
PHOSPHORUS: 3.5 mg/dL (ref 2.5–4.5)
Potassium: 5.3 mmol/L — ABNORMAL HIGH (ref 3.5–5.2)
SODIUM: 144 mmol/L (ref 134–144)

## 2018-08-23 LAB — LIPID PANEL WITH LDL/HDL RATIO
Cholesterol, Total: 165 mg/dL (ref 100–199)
HDL: 87 mg/dL (ref >39)
LDL CALC: 71 mg/dL (ref 0–99)
LDL/HDL RATIO: 0.8 ratio (ref 0.0–3.6)
Triglycerides: 37 mg/dL (ref 0–149)
VLDL Cholesterol Cal: 7 mg/dL (ref 5–40)

## 2018-08-29 ENCOUNTER — Ambulatory Visit: Payer: Medicare Other | Admitting: Urology

## 2018-08-29 ENCOUNTER — Encounter: Payer: Self-pay | Admitting: Urology

## 2018-08-29 VITALS — BP 160/78 | HR 59 | Ht 68.0 in | Wt 174.0 lb

## 2018-08-29 DIAGNOSIS — N401 Enlarged prostate with lower urinary tract symptoms: Secondary | ICD-10-CM

## 2018-08-29 DIAGNOSIS — R972 Elevated prostate specific antigen [PSA]: Secondary | ICD-10-CM | POA: Diagnosis not present

## 2018-08-29 DIAGNOSIS — N529 Male erectile dysfunction, unspecified: Secondary | ICD-10-CM

## 2018-08-29 LAB — BLADDER SCAN AMB NON-IMAGING

## 2018-08-29 MED ORDER — SILDENAFIL CITRATE 20 MG PO TABS: ORAL_TABLET | ORAL | 1 refills | Status: DC

## 2018-08-29 MED ORDER — DUTASTERIDE 0.5 MG PO CAPS: 0.5000 mg | ORAL_CAPSULE | Freq: Every day | ORAL | 1 refills | Status: DC

## 2018-08-29 NOTE — Progress Notes (Signed)
08/29/2018 9:30 AM   Alfred Edwards 1945-09-16 [id number]  Referring provider: Juline Patch, MD [address] [place], [place] [address]  Chief Complaint  Patient presents with  . Benign Prostatic Hypertrophy  . Elevated PSA   Urologic history: 1.  Elevated PSA  -Prostate biopsy 2008; PSA 6.2; 52 g gland; benign pathology  2.  BPH with lower urinary tract symptoms  -Dutasteride 3 times weekly  3.  Erectile dysfunction  -Generic sildenafil   HPI: 72 year old male presents for annual follow-up.   -Stable lower urinary tract symptoms on dutasteride; IPSS completed today 5/35; QoL 2/6   - Denies dysuria, gross hematuria or flank/abdominal/pelvic/scrotal pain.   -Stable ED on sildenafil   PMH: Past Medical History:  Diagnosis Date  . BPH (benign prostatic hypertrophy)   . GERD (gastroesophageal reflux disease)   . Hyperlipidemia   . Hypertension     Surgical History: Past Surgical History:  Procedure Laterality Date  . COLONOSCOPY  2005, 01/2015   Dr Alfred Edwards- cleared for 3 yrs  . TONSILLECTOMY    . UPPER GASTROINTESTINAL ENDOSCOPY  01/2015   Dr Alfred Edwards- erosions on esophagus    Home Medications:  Allergies as of 08/29/2018   No Known Allergies     Medication List       Accurate as of August 29, 2018  9:30 AM. Always use your most recent med list.        aspirin 81 MG tablet Take 1 tablet by mouth daily.   CENTRUM SILVER ADULT 50+ PO Take 1 tablet by mouth daily.   CoQ-10 100 MG Caps Take 1 capsule by mouth daily.   dutasteride 0.5 MG capsule Commonly known as:  AVODART Take 1 capsule 3 (three) times a week by mouth. Alfred Edwards   Fish Oil 1000 MG Caps Take 1 capsule (1,000 mg total) by mouth 2 (two) times daily.   lisinopril 10 MG tablet Commonly known as:  PRINIVIL,ZESTRIL Take 1 tablet (10 mg total) by mouth daily.   NIFEdipine 30 MG 24 hr tablet Commonly known as:  PROCARDIA-XL/NIFEDICAL-XL TAKE 1 TABLET (30 MG  TOTAL) BY MOUTH DAILY.   omega-3 acid ethyl esters 1 g capsule Commonly known as:  LOVAZA Take by mouth.   PRILOSEC OTC 20 MG tablet Generic drug:  omeprazole Take by mouth.   sildenafil 20 MG tablet Commonly known as:  REVATIO 1-5 tabs as needed 1 hour prior to intercourse   simvastatin 20 MG tablet Commonly known as:  ZOCOR Take 1 tablet (20 mg total) by mouth daily.   Vitamin C 500 MG Caps Take 1 capsule by mouth 2 (two) times daily.   Vitamin D3 50 MCG (2000 UT) capsule Take 1 capsule by mouth daily.   Zinc 25 MG Tabs Take 1 tablet by mouth daily.       Allergies: No Known Allergies  Family History: Family History  Problem Relation Age of Onset  . Heart disease Father   . Heart disease Brother     Social History:  reports that he has quit smoking. He has never used smokeless tobacco. He reports current alcohol use. He reports that he does not use drugs.  ROS: UROLOGY Frequent Urination?: No Hard to postpone urination?: No Burning/pain with urination?: No Get up at night to urinate?: Yes Leakage of urine?: No Urine stream starts and stops?: No Trouble starting stream?: No Do you have to strain to urinate?: No Blood in urine?: No Urinary tract infection?: No Sexually transmitted  disease?: No Injury to kidneys or bladder?: No Painful intercourse?: No Weak stream?: No Erection problems?: Yes Penile pain?: No  Gastrointestinal Nausea?: No Vomiting?: No Indigestion/heartburn?: No Diarrhea?: No Constipation?: No  Constitutional Fever: No Night sweats?: No Weight loss?: No Fatigue?: No  Skin Skin rash/lesions?: No Itching?: No  Eyes Blurred vision?: No Double vision?: No  Ears/Nose/Throat Sore throat?: No Sinus problems?: No  Hematologic/Lymphatic Swollen glands?: No Easy bruising?: No  Cardiovascular Leg swelling?: No Chest pain?: No  Respiratory Cough?: No Shortness of breath?: No  Endocrine Excessive thirst?:  No  Musculoskeletal Back pain?: No Joint pain?: No  Neurological Headaches?: No Dizziness?: No  Psychologic Depression?: No Anxiety?: No  Physical Exam: BP (!) 160/78 (BP Location: Left Arm, Patient Position: Sitting, Cuff Size: Normal)   Pulse (!) 59   Ht 5\' 8"  (1.727 m)   Wt 174 lb (78.9 kg)   BMI 26.46 kg/m   Constitutional:  Alert and oriented, No acute distress. HEENT: [place] AT, moist mucus membranes.  Trachea midline, no masses. Cardiovascular: No clubbing, cyanosis, or edema. Respiratory: Normal respiratory effort, no increased work of breathing. GI: Abdomen is soft, nontender, nondistended, no abdominal masses GU: No CVA tenderness.  Prostate 50 g, smooth without nodules Lymph: No cervical or inguinal lymphadenopathy. Skin: No rashes, bruises or suspicious lesions. Neurologic: Grossly intact, no focal deficits, moving all 4 extremities. Psychiatric: Normal mood and affect.   Assessment & Plan:    1. Elevated PSA Benign DRE; PSA drawn today and if stable continue annual follow-up  2. Erectile dysfunction, unspecified erectile dysfunction type Sildenafil refilled  3. Benign prostatic hyperplasia with lower urinary tract symptoms, symptom details unspecified - Stable voiding symptoms on dutasteride - Bladder Scan 57 mL    Alfred Edwards, [place] [address] [place], [place] [address] [phone]

## 2018-08-30 LAB — PSA: Prostate Specific Ag, Serum: 2.2 ng/mL (ref 0.0–4.0)

## 2018-08-31 ENCOUNTER — Telehealth: Payer: Self-pay | Admitting: Family Medicine

## 2018-08-31 NOTE — Telephone Encounter (Signed)
Patient notified and voiced understanding.

## 2018-08-31 NOTE — Telephone Encounter (Signed)
-----   Message from Abbie Sons, MD sent at 08/31/2018  7:15 AM EST ----- PSA stable at 2.2

## 2018-10-08 ENCOUNTER — Telehealth: Payer: Self-pay | Admitting: Family Medicine

## 2018-10-08 NOTE — Telephone Encounter (Signed)
Called to schedule Medicare Annual Wellness Visit with the Nurse Health Advisor. No answer on home phone and mobile phone.  Left message on both machines.  If patient returns call, please note: their last AWV was on 07/24/17, please schedule AWV with [facility] any date AFTER 07/24/2018.  Thank you! For any questions please contact: Janace Hoard at [phone] or Skype [email or web address]

## 2018-10-10 ENCOUNTER — Ambulatory Visit (INDEPENDENT_AMBULATORY_CARE_PROVIDER_SITE_OTHER): Payer: Medicare Other

## 2018-10-10 VITALS — BP 132/76 | HR 63 | Temp 98.1°F | Resp 16 | Ht 68.0 in | Wt 177.2 lb

## 2018-10-10 DIAGNOSIS — Z0000 Encounter for general adult medical examination without abnormal findings: Secondary | ICD-10-CM

## 2018-10-10 NOTE — Progress Notes (Signed)
Subjective:   Alfred Edwards is a 73 y.o. male who presents for Medicare Annual/Subsequent preventive examination.  Review of Systems:  Cardiac Risk Factors include: advanced age (>18men, >23 women);dyslipidemia;hypertension;male gender     Objective:    Vitals: BP 132/76 (BP Location: Left Arm, Patient Position: Sitting, Cuff Size: Normal)   Pulse 63   Temp 98.1 F (36.7 C) (Oral)   Resp 16   Ht 5\' 8"  (1.727 m)   Wt 177 lb 3.2 oz (80.4 kg)   SpO2 98%   BMI 26.94 kg/m   Body mass index is 26.94 kg/m.  Advanced Directives 10/10/2018 07/24/2017 06/29/2015  Does Patient Have a Medical Advance Directive? Yes Yes Yes  Type of Advance Directive Living will;Healthcare Power of [place];Living will [place];Living will  Copy of [place] in Chart? Yes - validated most recent copy scanned in chart (See row information) No - copy requested No - copy requested    Tobacco Social History   Tobacco Use  Smoking Status Former Smoker  . Types: Cigarettes  . Last attempt to quit: 09/13/1971  . Years since quitting: 47.1  Smokeless Tobacco Never Used     Counseling given: Not Answered   Clinical Intake:  Pre-visit preparation completed: Yes  Pain : No/denies pain     Nutritional Status: BMI 25 -29 Overweight Nutritional Risks: None Diabetes: No  How often do you need to have someone help you when you read instructions, pamphlets, or other written materials from your doctor or pharmacy?: 1 - Never What is the last grade level you completed in school?: bachelors degree  Interpreter Needed?: No  Information entered by :: Clemetine Marker LPN  Past Medical History:  Diagnosis Date  . BPH (benign prostatic hypertrophy)   . GERD (gastroesophageal reflux disease)   . Hyperlipidemia   . Hypertension    Past Surgical History:  Procedure Laterality Date  . COLONOSCOPY  2005, 01/2015   Dr Vira Agar- cleared for 3  yrs  . TONSILLECTOMY    . UPPER GASTROINTESTINAL ENDOSCOPY  01/2015   Dr Vira Agar- erosions on esophagus   Family History  Problem Relation Age of Onset  . Heart disease Father   . Heart disease Brother    Social History   Socioeconomic History  . Marital status: Widowed    Spouse name: Not on file  . Number of children: 0  . Years of education: Not on file  . Highest education level: Bachelor's degree (e.g., BA, AB, BS)  Occupational History  . Occupation: Retired  Scientific laboratory technician  . Financial resource strain: Not hard at all  . Food insecurity:    Worry: Never true    Inability: Never true  . Transportation needs:    Medical: No    Non-medical: No  Tobacco Use  . Smoking status: Former Smoker    Types: Cigarettes    Last attempt to quit: 09/13/1971    Years since quitting: 47.1  . Smokeless tobacco: Never Used  Substance and Sexual Activity  . Alcohol use: Yes    Alcohol/week: 0.0 standard drinks    Comment: socially  . Drug use: No  . Sexual activity: Yes  Lifestyle  . Physical activity:    Days per week: 3 days    Minutes per session: 60 min  . Stress: Not at all  Relationships  . Social connections:    Talks on phone: More than three times a week  Gets together: Three times a week    Attends religious service: More than 4 times per year    Active member of club or organization: No    Attends meetings of clubs or organizations: Never    Relationship status: Widowed  Other Topics Concern  . Not on file  Social History Narrative   Wife passed in 2012 from cancer    Outpatient Encounter Medications as of 10/10/2018  Medication Sig  . Ascorbic Acid (VITAMIN C) 500 MG CAPS Take 1 capsule by mouth 2 (two) times daily.   Marland Kitchen aspirin 81 MG tablet Take 1 tablet by mouth daily.  . Cholecalciferol (VITAMIN D3) 2000 UNITS capsule Take 1 capsule by mouth daily.  . Coenzyme Q10 (COQ-10) 100 MG CAPS Take 1 capsule by mouth daily.  Marland Kitchen dutasteride (AVODART) 0.5 MG capsule  Take 1 capsule (0.5 mg total) by mouth daily.  Marland Kitchen lisinopril (PRINIVIL,ZESTRIL) 10 MG tablet Take 1 tablet (10 mg total) by mouth daily.  . Multiple Vitamins-Minerals (CENTRUM SILVER ADULT 50+ PO) Take 1 tablet by mouth daily.  Marland Kitchen NIFEdipine (PROCARDIA-XL/NIFEDICAL-XL) 30 MG 24 hr tablet TAKE 1 TABLET (30 MG TOTAL) BY MOUTH DAILY.  Marland Kitchen Omega-3 Fatty Acids (FISH OIL) 1000 MG CAPS Take 1 capsule (1,000 mg total) by mouth 2 (two) times daily.  Marland Kitchen omeprazole (PRILOSEC OTC) 20 MG tablet Take by mouth.  . sildenafil (REVATIO) 20 MG tablet 1-5 tabs as needed 1 hour prior to intercourse  . simvastatin (ZOCOR) 20 MG tablet Take 1 tablet (20 mg total) by mouth daily.  . Zinc 25 MG TABS Take 1 tablet by mouth daily.  . [DISCONTINUED] omega-3 acid ethyl esters (LOVAZA) 1 g capsule Take by mouth.   No facility-administered encounter medications on file as of 10/10/2018.     Activities of Daily Living In your present state of health, do you have any difficulty performing the following activities: 10/10/2018  Hearing? Y  Comment declines hearing aids  Vision? N  Comment reading glasses  Difficulty concentrating or making decisions? N  Walking or climbing stairs? N  Dressing or bathing? N  Doing errands, shopping? N  Preparing Food and eating ? N  Using the Toilet? N  In the past six months, have you accidently leaked urine? N  Do you have problems with loss of bowel control? N  Managing your Medications? N  Managing your Finances? N  Housekeeping or managing your Housekeeping? N  Some recent data might be hidden    Patient Care Team: Juline Patch, MD as PCP - General (Family Medicine) Abbie Sons, MD as Consulting Physician (Urology) Jannet Mantis, MD as Consulting Physician (Dermatology)   Assessment:   This is a routine wellness examination for Alfred Edwards.  Exercise Activities and Dietary recommendations Current Exercise Habits: Structured exercise class, Type of exercise: Other -  see comments;strength training/weights;calisthenics(gym 3 times weekly and avid golfer), Time (Minutes): 60, Frequency (Times/Week): 4, Weekly Exercise (Minutes/Week): 240, Intensity: Moderate, Exercise limited by: None identified  Goals    . Reduce sugar intake to X grams per day     Continue to reduce sweets and carbs from diet    . Weight (lb) < 160 lb (72.6 kg)     Pt would like to lose weight to get to goal of 160       Fall Risk Fall Risk  10/10/2018 02/21/2018 07/24/2017 02/02/2017 12/28/2015  Falls in the past year? 0 No No No No  Number falls in past yr:  0 - - - -  Follow up Falls prevention discussed - - - -   FALL RISK PREVENTION PERTAINING TO THE HOME:  Any stairs in or around the home WITH handrails? No  Home free of loose throw rugs in walkways, pet beds, electrical cords, etc? Yes  Adequate lighting in your home to reduce risk of falls? Yes   ASSISTIVE DEVICES UTILIZED TO PREVENT FALLS:  Life alert? No  Use of a cane, walker or w/c? No  Grab bars in the bathroom? No  Shower chair or bench in shower? Yes  Elevated toilet seat or a handicapped toilet? No   DME ORDERS:  DME order needed?  Yes   TIMED UP AND GO:  Was the test performed? Yes .  Length of time to ambulate 10 feet: 6 sec.   GAIT:  Appearance of gait: Gait stead-fast and without the use of an assistive device.  Education: Fall risk prevention has been discussed.  Intervention(s) required? No   Depression Screen PHQ 2/9 Scores 10/10/2018 08/22/2018 02/21/2018 07/24/2017  PHQ - 2 Score 0 0 0 0  PHQ- 9 Score - 0 0 -    Cognitive Function     6CIT Screen 10/10/2018 07/24/2017  What Year? 0 points 0 points  What month? 0 points 0 points  What time? 0 points 0 points  Count back from 20 0 points 0 points  Months in reverse 0 points 0 points  Repeat phrase 0 points 0 points  Total Score 0 0    Immunization History  Administered Date(s) Administered  . Influenza, High Dose Seasonal PF  06/22/2017, 07/24/2018  . Influenza,inj,Quad PF,6+ Mos 07/01/2013, 06/27/2014, 06/29/2015, 08/02/2016  . Influenza-Unspecified 06/27/2014  . Pneumococcal Conjugate-13 07/24/2017  . Pneumococcal Polysaccharide-23 06/23/2008, 08/22/2018  . Tdap 12/06/2012    Qualifies for Shingles Vaccine? Yes . Due for Shingrix. Education has been provided regarding the importance of this vaccine. Pt has been advised to call insurance company to determine out of pocket expense. Advised may also receive vaccine at local pharmacy or Health Dept. Verbalized acceptance and understanding.  Tdap: Up to date  Flu Vaccine: Up to date  Pneumococcal Vaccine: Up to date   Screening Tests Health Maintenance  Topic Date Due  . COLONOSCOPY  02/13/2021  . TETANUS/TDAP  12/07/2022  . INFLUENZA VACCINE  Completed  . Hepatitis C Screening  Completed  . PNA vac Low Risk Adult  Completed  Cancer Screenings:  Colorectal Screening: Completed 02/13/18. Repeat every 5 years  Lung Cancer Screening: (Low Dose CT Chest recommended if Age 36-80 years, 30 pack-year currently smoking OR have quit w/in 15years.) does not qualify.    Additional Screening:  Hepatitis C Screening: does qualify; Completed 07/24/17  Vision Screening: Recommended annual ophthalmology exams for early detection of glaucoma and other disorders of the eye. Is the patient up to date with their annual eye exam?  No  Who is the provider or what is the name of the office in which the pt attends annual eye exams? No established provider If pt is not established with a provider, would they like to be referred to a provider to establish care? No .   Dental Screening: Recommended annual dental exams for proper oral hygiene  Community Resource Referral:  CRR required this visit?  No      Plan:    I have personally reviewed and addressed the Medicare Annual Wellness questionnaire and have noted the following in the patient's chart:  A. Medical and  social history B. Use of alcohol, tobacco or illicit drugs  C. Current medications and supplements D. Functional ability and status E.  Nutritional status F.  Physical activity G. Advance directives H. List of other physicians I.  Hospitalizations, surgeries, and ER visits in previous 12 months J.  [place] such as hearing and vision if needed, cognitive and depression L. Referrals and appointments   In addition, I have reviewed and discussed with patient certain preventive protocols, quality metrics, and best practice recommendations. A written personalized care plan for preventive services as well as general preventive health recommendations were provided to patient.   Signed,  Clemetine Marker, LPN Nurse Health Advisor   Nurse Notes: pt doing well overall and appreciative of visit today

## 2018-10-10 NOTE — Patient Instructions (Signed)
Mr. Alfred Edwards , Thank you for taking time to come for your Medicare Wellness Visit. I appreciate your ongoing commitment to your health goals. Please review the following plan we discussed and let me know if I can assist you in the future.   Screening recommendations/referrals: Colonoscopy: done 02/13/18 repeat in 2024 Recommended yearly ophthalmology/optometry visit for glaucoma screening and checkup Recommended yearly dental visit for hygiene and checkup  Vaccinations: Influenza vaccine: done 07/13/18 Pneumococcal vaccine: done 08/22/18 Tdap vaccine: done 12/06/12 Shingles vaccine: Shingrix discussed. Please contact your pharmacy for coverage information.     Conditions/risks identified: Keep up the great work!  Next appointment: Please follow up in one year for your Medicare Annual Wellness visit.    Preventive Care 87 Years and Older, Male Preventive care refers to lifestyle choices and visits with your health care provider that can promote health and wellness. What does preventive care include?  A yearly physical exam. This is also called an annual well check.  Dental exams once or twice a year.  Routine eye exams. Ask your health care provider how often you should have your eyes checked.  Personal lifestyle choices, including:  Daily care of your teeth and gums.  Regular physical activity.  Eating a healthy diet.  Avoiding tobacco and drug use.  Limiting alcohol use.  Practicing safe sex.  Taking low doses of aspirin every day.  Taking vitamin and mineral supplements as recommended by your health care provider. What happens during an annual well check? The services and screenings done by your health care provider during your annual well check will depend on your age, overall health, lifestyle risk factors, and family history of disease. Counseling  Your health care provider may ask you questions about your:  Alcohol use.  Tobacco use.  Drug use.  Emotional  well-being.  Home and relationship well-being.  Sexual activity.  Eating habits.  History of falls.  Memory and ability to understand (cognition).  Work and work Statistician. Screening  You may have the following tests or measurements:  Height, weight, and BMI.  Blood pressure.  Lipid and cholesterol levels. These may be checked every 5 years, or more frequently if you are over 23 years old.  Skin check.  Lung cancer screening. You may have this screening every year starting at age 47 if you have a 30-pack-year history of smoking and currently smoke or have quit within the past 15 years.  Fecal occult blood test (FOBT) of the stool. You may have this test every year starting at age 41.  Flexible sigmoidoscopy or colonoscopy. You may have a sigmoidoscopy every 5 years or a colonoscopy every 10 years starting at age 36.  Prostate cancer screening. Recommendations will vary depending on your family history and other risks.  Hepatitis C blood test.  Hepatitis B blood test.  Sexually transmitted disease (STD) testing.  Diabetes screening. This is done by checking your blood sugar (glucose) after you have not eaten for a while (fasting). You may have this done every 1-3 years.  Abdominal aortic aneurysm (AAA) screening. You may need this if you are a current or former smoker.  Osteoporosis. You may be screened starting at age 67 if you are at high risk. Talk with your health care provider about your test results, treatment options, and if necessary, the need for more tests. Vaccines  Your health care provider may recommend certain vaccines, such as:  Influenza vaccine. This is recommended every year.  Tetanus, diphtheria, and acellular pertussis (Tdap,  Td) vaccine. You may need a Td booster every 10 years.  Zoster vaccine. You may need this after age 28.  Pneumococcal 13-valent conjugate (PCV13) vaccine. One dose is recommended after age 53.  Pneumococcal  polysaccharide (PPSV23) vaccine. One dose is recommended after age 78. Talk to your health care provider about which screenings and vaccines you need and how often you need them. This information is not intended to replace advice given to you by your health care provider. Make sure you discuss any questions you have with your health care provider. Document Released: 09/25/2015 Document Revised: 05/18/2016 Document Reviewed: 06/30/2015 Elsevier Interactive Patient Education  2017 [place] Prevention in the Home Falls can cause injuries. They can happen to people of all ages. There are many things you can do to make your home safe and to help prevent falls. What can I do on the outside of my home?  Regularly fix the edges of walkways and driveways and fix any cracks.  Remove anything that might make you trip as you walk through a door, such as a raised step or threshold.  Trim any bushes or trees on the path to your home.  Use bright outdoor lighting.  Clear any walking paths of anything that might make someone trip, such as rocks or tools.  Regularly check to see if handrails are loose or broken. Make sure that both sides of any steps have handrails.  Any raised decks and porches should have guardrails on the edges.  Have any leaves, snow, or ice cleared regularly.  Use sand or salt on walking paths during winter.  Clean up any spills in your garage right away. This includes oil or grease spills. What can I do in the bathroom?  Use night lights.  Install grab bars by the toilet and in the tub and shower. Do not use towel bars as grab bars.  Use non-skid mats or decals in the tub or shower.  If you need to sit down in the shower, use a plastic, non-slip stool.  Keep the floor dry. Clean up any water that spills on the floor as soon as it happens.  Remove soap buildup in the tub or shower regularly.  Attach bath mats securely with double-sided non-slip rug  tape.  Do not have throw rugs and other things on the floor that can make you trip. What can I do in the bedroom?  Use night lights.  Make sure that you have a light by your bed that is easy to reach.  Do not use any sheets or blankets that are too big for your bed. They should not hang down onto the floor.  Have a firm chair that has side arms. You can use this for support while you get dressed.  Do not have throw rugs and other things on the floor that can make you trip. What can I do in the kitchen?  Clean up any spills right away.  Avoid walking on wet floors.  Keep items that you use a lot in easy-to-reach places.  If you need to reach something above you, use a strong step stool that has a grab bar.  Keep electrical cords out of the way.  Do not use floor polish or wax that makes floors slippery. If you must use wax, use non-skid floor wax.  Do not have throw rugs and other things on the floor that can make you trip. What can I do with my stairs?  Do not  leave any items on the stairs.  Make sure that there are handrails on both sides of the stairs and use them. Fix handrails that are broken or loose. Make sure that handrails are as long as the stairways.  Check any carpeting to make sure that it is firmly attached to the stairs. Fix any carpet that is loose or worn.  Avoid having throw rugs at the top or bottom of the stairs. If you do have throw rugs, attach them to the floor with carpet tape.  Make sure that you have a light switch at the top of the stairs and the bottom of the stairs. If you do not have them, ask someone to add them for you. What else can I do to help prevent falls?  Wear shoes that:  Do not have high heels.  Have rubber bottoms.  Are comfortable and fit you well.  Are closed at the toe. Do not wear sandals.  If you use a stepladder:  Make sure that it is fully opened. Do not climb a closed stepladder.  Make sure that both sides of the  stepladder are locked into place.  Ask someone to hold it for you, if possible.  Clearly mark and make sure that you can see:  Any grab bars or handrails.  First and last steps.  Where the edge of each step is.  Use tools that help you move around (mobility aids) if they are needed. These include:  Canes.  Walkers.  Scooters.  Crutches.  Turn on the lights when you go into a dark area. Replace any light bulbs as soon as they burn out.  Set up your furniture so you have a clear path. Avoid moving your furniture around.  If any of your floors are uneven, fix them.  If there are any pets around you, be aware of where they are.  Review your medicines with your doctor. Some medicines can make you feel dizzy. This can increase your chance of falling. Ask your doctor what other things that you can do to help prevent falls. This information is not intended to replace advice given to you by your health care provider. Make sure you discuss any questions you have with your health care provider. Document Released: 06/25/2009 Document Revised: 02/04/2016 Document Reviewed: 10/03/2014 Elsevier Interactive Patient Education  2017 [employer].

## 2018-11-11 DIAGNOSIS — G459 Transient cerebral ischemic attack, unspecified: Secondary | ICD-10-CM

## 2018-11-11 HISTORY — DX: Transient cerebral ischemic attack, unspecified: G45.9

## 2018-11-28 ENCOUNTER — Emergency Department: Payer: Medicare Other

## 2018-11-28 ENCOUNTER — Other Ambulatory Visit: Payer: Self-pay

## 2018-11-28 ENCOUNTER — Inpatient Hospital Stay
Admission: EM | Admit: 2018-11-28 | Discharge: 2018-11-29 | DRG: 065 | Disposition: A | Discharge status: Home / Self Care | Payer: Medicare Other | Attending: Internal Medicine | Admitting: Internal Medicine

## 2018-11-28 ENCOUNTER — Observation Stay: Payer: Medicare Other

## 2018-11-28 DIAGNOSIS — Z7982 Long term (current) use of aspirin: Secondary | ICD-10-CM

## 2018-11-28 DIAGNOSIS — N400 Enlarged prostate without lower urinary tract symptoms: Secondary | ICD-10-CM | POA: Diagnosis present

## 2018-11-28 DIAGNOSIS — I10 Essential (primary) hypertension: Secondary | ICD-10-CM | POA: Diagnosis not present

## 2018-11-28 DIAGNOSIS — R531 Weakness: Secondary | ICD-10-CM | POA: Diagnosis not present

## 2018-11-28 DIAGNOSIS — E785 Hyperlipidemia, unspecified: Secondary | ICD-10-CM | POA: Diagnosis present

## 2018-11-28 DIAGNOSIS — Z79899 Other long term (current) drug therapy: Secondary | ICD-10-CM

## 2018-11-28 DIAGNOSIS — K219 Gastro-esophageal reflux disease without esophagitis: Secondary | ICD-10-CM | POA: Diagnosis present

## 2018-11-28 DIAGNOSIS — I639 Cerebral infarction, unspecified: Secondary | ICD-10-CM | POA: Diagnosis not present

## 2018-11-28 DIAGNOSIS — I6523 Occlusion and stenosis of bilateral carotid arteries: Secondary | ICD-10-CM | POA: Diagnosis not present

## 2018-11-28 DIAGNOSIS — I6381 Other cerebral infarction due to occlusion or stenosis of small artery: Principal | ICD-10-CM | POA: Diagnosis present

## 2018-11-28 DIAGNOSIS — I340 Nonrheumatic mitral (valve) insufficiency: Secondary | ICD-10-CM | POA: Diagnosis not present

## 2018-11-28 DIAGNOSIS — Z8673 Personal history of transient ischemic attack (TIA), and cerebral infarction without residual deficits: Secondary | ICD-10-CM | POA: Diagnosis present

## 2018-11-28 DIAGNOSIS — G459 Transient cerebral ischemic attack, unspecified: Secondary | ICD-10-CM | POA: Diagnosis not present

## 2018-11-28 DIAGNOSIS — R29700 NIHSS score 0: Secondary | ICD-10-CM | POA: Diagnosis present

## 2018-11-28 DIAGNOSIS — Z8249 Family history of ischemic heart disease and other diseases of the circulatory system: Secondary | ICD-10-CM | POA: Diagnosis not present

## 2018-11-28 DIAGNOSIS — Z87891 Personal history of nicotine dependence: Secondary | ICD-10-CM

## 2018-11-28 DIAGNOSIS — I6389 Other cerebral infarction: Secondary | ICD-10-CM | POA: Diagnosis not present

## 2018-11-28 DIAGNOSIS — G8194 Hemiplegia, unspecified affecting left nondominant side: Secondary | ICD-10-CM | POA: Diagnosis not present

## 2018-11-28 DIAGNOSIS — Z66 Do not resuscitate: Secondary | ICD-10-CM | POA: Diagnosis not present

## 2018-11-28 DIAGNOSIS — I351 Nonrheumatic aortic (valve) insufficiency: Secondary | ICD-10-CM | POA: Diagnosis not present

## 2018-11-28 LAB — DIFFERENTIAL
Abs Immature Granulocytes: 0.03 10*3/uL (ref 0.00–0.07)
Basophils Absolute: 0 10*3/uL (ref 0.0–0.1)
Basophils Relative: 1 %
Eosinophils Absolute: 0.1 10*3/uL (ref 0.0–0.5)
Eosinophils Relative: 2 %
Immature Granulocytes: 0 %
Lymphocytes Relative: 22 %
Lymphs Abs: 1.6 10*3/uL (ref 0.7–4.0)
MONO ABS: 0.7 10*3/uL (ref 0.1–1.0)
Monocytes Relative: 9 %
NEUTROS ABS: 4.8 10*3/uL (ref 1.7–7.7)
Neutrophils Relative %: 66 %

## 2018-11-28 LAB — URINALYSIS, COMPLETE (UACMP) WITH MICROSCOPIC
BACTERIA UA: NONE SEEN
Bilirubin Urine: NEGATIVE
Glucose, UA: NEGATIVE mg/dL
Hgb urine dipstick: NEGATIVE
Ketones, ur: NEGATIVE mg/dL
Leukocytes,Ua: NEGATIVE
Nitrite: NEGATIVE
Protein, ur: NEGATIVE mg/dL
Specific Gravity, Urine: 1.011 (ref 1.005–1.030)
Squamous Epithelial / HPF: NONE SEEN (ref 0–5)
pH: 5 (ref 5.0–8.0)

## 2018-11-28 LAB — COMPREHENSIVE METABOLIC PANEL
ALT: 20 U/L (ref 0–44)
AST: 23 U/L (ref 15–41)
Albumin: 3.8 g/dL (ref 3.5–5.0)
Alkaline Phosphatase: 48 U/L (ref 38–126)
Anion gap: 11 (ref 5–15)
BUN: 20 mg/dL (ref 8–23)
CO2: 21 mmol/L — ABNORMAL LOW (ref 22–32)
Calcium: 8.4 mg/dL — ABNORMAL LOW (ref 8.9–10.3)
Chloride: 108 mmol/L (ref 98–111)
Creatinine, Ser: 1.09 mg/dL (ref 0.61–1.24)
GFR calc Af Amer: >60 mL/min (ref >60)
GFR calc non Af Amer: >60 mL/min (ref >60)
Glucose, Bld: 116 mg/dL — ABNORMAL HIGH (ref 70–99)
Potassium: 3.9 mmol/L (ref 3.5–5.1)
Sodium: 140 mmol/L (ref 135–145)
Total Bilirubin: 0.5 mg/dL (ref 0.3–1.2)
Total Protein: 7.2 g/dL (ref 6.5–8.1)

## 2018-11-28 LAB — CBC
HCT: 39.7 % (ref 39.0–52.0)
HEMOGLOBIN: 12.8 g/dL — AB (ref 13.0–17.0)
MCH: 27.4 pg (ref 26.0–34.0)
MCHC: 32.2 g/dL (ref 30.0–36.0)
MCV: 84.8 fL (ref 80.0–100.0)
Platelets: 264 10*3/uL (ref 150–400)
RBC: 4.68 MIL/uL (ref 4.22–5.81)
RDW: 14.8 % (ref 11.5–15.5)
WBC: 7.3 10*3/uL (ref 4.0–10.5)
nRBC: 0 % (ref 0.0–0.2)

## 2018-11-28 LAB — PROTIME-INR
INR: 1.1 (ref 0.8–1.2)
Prothrombin Time: 13.6 seconds (ref 11.4–15.2)

## 2018-11-28 LAB — APTT: aPTT: 31 seconds (ref 24–36)

## 2018-11-28 LAB — HEMOGLOBIN A1C
Hgb A1c MFr Bld: 5.5 % (ref 4.8–5.6)
Mean Plasma Glucose: 111.15 mg/dL

## 2018-11-28 LAB — GLUCOSE, CAPILLARY: GLUCOSE-CAPILLARY: 113 mg/dL — AB (ref 70–99)

## 2018-11-28 MED ORDER — ASPIRIN EC 325 MG PO TBEC
325.0000 mg | DELAYED_RELEASE_TABLET | Freq: Every day | ORAL | Status: DC
  Administered 2018-11-29: 325 mg via ORAL
  Filled 2018-11-28: qty 1

## 2018-11-28 MED ORDER — ASPIRIN 81 MG PO CHEW
324.0000 mg | CHEWABLE_TABLET | Freq: Once | ORAL | Status: AC
  Administered 2018-11-28: 324 mg via ORAL
  Filled 2018-11-28: qty 4

## 2018-11-28 MED ORDER — ACETAMINOPHEN 325 MG PO TABS: 650.0000 mg | ORAL_TABLET | ORAL | Status: DC | PRN

## 2018-11-28 MED ORDER — ENOXAPARIN SODIUM 40 MG/0.4ML ~~LOC~~ SOLN
40.0000 mg | SUBCUTANEOUS | Status: DC
  Administered 2018-11-28: 23:00:00 40 mg via SUBCUTANEOUS
  Filled 2018-11-28: qty 0.4

## 2018-11-28 MED ORDER — DUTASTERIDE 0.5 MG PO CAPS: 0.5000 mg | ORAL_CAPSULE | ORAL | Status: DC

## 2018-11-28 MED ORDER — ACETAMINOPHEN 160 MG/5ML PO SOLN
650.0000 mg | ORAL | Status: DC | PRN
  Filled 2018-11-28: qty 20.3

## 2018-11-28 MED ORDER — LISINOPRIL 10 MG PO TABS
10.0000 mg | ORAL_TABLET | Freq: Every day | ORAL | Status: DC
  Administered 2018-11-29: 11:00:00 10 mg via ORAL
  Filled 2018-11-28: qty 1

## 2018-11-28 MED ORDER — ASPIRIN 300 MG RE SUPP: 300.0000 mg | Freq: Every day | RECTAL | Status: DC

## 2018-11-28 MED ORDER — NIFEDIPINE ER OSMOTIC RELEASE 30 MG PO TB24
30.0000 mg | ORAL_TABLET | Freq: Every day | ORAL | Status: DC
  Administered 2018-11-29: 30 mg via ORAL
  Filled 2018-11-28: qty 1

## 2018-11-28 MED ORDER — ACETAMINOPHEN 650 MG RE SUPP: 650.0000 mg | RECTAL | Status: DC | PRN

## 2018-11-28 MED ORDER — STROKE: EARLY STAGES OF RECOVERY BOOK
Freq: Once | Status: AC
  Administered 2018-11-28

## 2018-11-28 MED ORDER — OMEGA-3-ACID ETHYL ESTERS 1 G PO CAPS
1.0000 g | ORAL_CAPSULE | Freq: Two times a day (BID) | ORAL | Status: DC
  Administered 2018-11-28 – 2018-11-29 (×2): 1 g via ORAL
  Filled 2018-11-28 (×2): qty 1

## 2018-11-28 MED ORDER — SODIUM CHLORIDE 0.9% FLUSH
3.0000 mL | Freq: Once | INTRAVENOUS | Status: DC
Start: 2018-11-28 — End: 2018-11-29

## 2018-11-28 MED ORDER — IOHEXOL 350 MG/ML SOLN
75.0000 mL | Freq: Once | INTRAVENOUS | Status: AC | PRN
  Administered 2018-11-28: 75 mL via INTRAVENOUS

## 2018-11-28 MED ORDER — PANTOPRAZOLE SODIUM 20 MG PO TBEC
20.0000 mg | DELAYED_RELEASE_TABLET | Freq: Every day | ORAL | Status: DC
  Administered 2018-11-29: 20 mg via ORAL
  Filled 2018-11-28: qty 1

## 2018-11-28 MED ORDER — SIMVASTATIN 20 MG PO TABS
20.0000 mg | ORAL_TABLET | Freq: Every day | ORAL | Status: DC
  Administered 2018-11-29: 20 mg via ORAL
  Filled 2018-11-28: qty 1

## 2018-11-28 NOTE — ED Triage Notes (Signed)
Pt states weakness that affects "my entire left side" that began between 14--1500 today. Pt is ambulatory without difficulty. Pt with strong equal grips to both hands, sensation intact to both sides.

## 2018-11-28 NOTE — H&P (Signed)
[facility] NAME: Alfred Edwards    MR#:  [id number]  DATE OF BIRTH:  [birth year]  DATE OF ADMISSION:  11/28/2018  PRIMARY CARE PHYSICIAN: Juline Patch, MD   REQUESTING/REFERRING PHYSICIAN: Dr. Mariea Clonts  CHIEF COMPLAINT:   Chief Complaint  Patient presents with  . left side numbness    HISTORY OF PRESENT ILLNESS:  Alfred Edwards  is a 73 y.o. male with a known history of hypertension, BPH on baby aspirin and statin at home presents to the emergency room complaining of left hand numbness and weakness starting between 2:58 PM when he went grocery shopping.  Patient was able to walk normally.  Returned home and then noticed some left leg weakness.  No trouble swallowing or vision changes.  Never had similar symptoms in the past.  No history of stroke.  Arrived to the emergency room and now he feels back to normal with no numbness or weakness.  He had CT scan of the head which showed no stroke or bleed. CT of the head and neck were done which shows mild stenosis but nothing significant. Patient has been seen by tele neurology.  PAST MEDICAL HISTORY:   Past Medical History:  Diagnosis Date  . BPH (benign prostatic hypertrophy)   . GERD (gastroesophageal reflux disease)   . Hyperlipidemia   . Hypertension     PAST SURGICAL HISTORY:   Past Surgical History:  Procedure Laterality Date  . COLONOSCOPY  2005, 01/2015   Dr Vira Agar- cleared for 3 yrs  . TONSILLECTOMY    . UPPER GASTROINTESTINAL ENDOSCOPY  01/2015   Dr Vira Agar- erosions on esophagus    SOCIAL HISTORY:   Social History   Tobacco Use  . Smoking status: Former Smoker    Types: Cigarettes    Last attempt to quit: 09/13/1971    Years since quitting: 47.2  . Smokeless tobacco: Never Used  Substance Use Topics  . Alcohol use: Yes    Alcohol/week: 0.0 standard drinks    Comment: socially    FAMILY HISTORY:   Family History  Problem Relation Age of Onset  .  Heart disease Father   . Heart disease Brother     DRUG ALLERGIES:  No Known Allergies  REVIEW OF SYSTEMS:   Review of Systems  Constitutional: Positive for malaise/fatigue. Negative for chills and fever.  HENT: Negative for sore throat.   Eyes: Negative for blurred vision, double vision and pain.  Respiratory: Negative for cough, hemoptysis, shortness of breath and wheezing.   Cardiovascular: Negative for chest pain, palpitations, orthopnea and leg swelling.  Gastrointestinal: Negative for abdominal pain, constipation, diarrhea, heartburn, nausea and vomiting.  Genitourinary: Negative for dysuria and hematuria.  Musculoskeletal: Negative for back pain and joint pain.  Skin: Negative for rash.  Neurological: Positive for sensory change and focal weakness. Negative for speech change and headaches.  Endo/Heme/Allergies: Does not bruise/bleed easily.  Psychiatric/Behavioral: Negative for depression. The patient is not nervous/anxious.     MEDICATIONS AT HOME:   Prior to Admission medications   Medication Sig Start Date End Date Taking? Authorizing Provider  Ascorbic Acid (VITAMIN C) 500 MG CAPS Take 1 capsule by mouth 2 (two) times daily.    Yes [provider]  aspirin 81 MG tablet Take 1 tablet by mouth daily.   Yes [provider]  Cholecalciferol (VITAMIN D3) 2000 UNITS capsule Take 1 capsule by mouth daily.   Yes [provider]  Coenzyme Q10 (COQ-10)  100 MG CAPS Take 1 capsule by mouth daily.   Yes [provider]  dutasteride (AVODART) 0.5 MG capsule Take 1 capsule (0.5 mg total) by mouth daily. Patient taking differently: Take 0.5 mg by mouth 3 (three) times a week. Monday, Wednesday and Friday 08/29/18  Yes Stoioff, Ronda Fairly, MD  lisinopril (PRINIVIL,ZESTRIL) 10 MG tablet Take 1 tablet (10 mg total) by mouth daily. 08/22/18  Yes Juline Patch, MD  Multiple Vitamins-Minerals (CENTRUM SILVER ADULT 50+ PO) Take 1 tablet by mouth daily.   Yes  [provider]  NIFEdipine (PROCARDIA-XL/NIFEDICAL-XL) 30 MG 24 hr tablet TAKE 1 TABLET (30 MG TOTAL) BY MOUTH DAILY. 08/22/18  Yes Juline Patch, MD  Omega-3 Fatty Acids (FISH OIL) 1000 MG CAPS Take 1 capsule (1,000 mg total) by mouth 2 (two) times daily. 08/22/18  Yes Juline Patch, MD  omeprazole (PRILOSEC OTC) 20 MG tablet Take 20 mg by mouth daily.    Yes [provider]  simvastatin (ZOCOR) 20 MG tablet Take 1 tablet (20 mg total) by mouth daily. 08/22/18  Yes Juline Patch, MD  Zinc 25 MG TABS Take 25 mg by mouth daily.    Yes [provider]  sildenafil (REVATIO) 20 MG tablet 1-5 tabs as needed 1 hour prior to intercourse 08/29/18   Stoioff, Ronda Fairly, MD     VITAL SIGNS:  Blood pressure (!) 168/77, pulse 78, temperature 98 F (36.7 C), resp. rate 18, height 5\' 8"  (1.727 m), weight 76.2 kg, SpO2 99 %.  PHYSICAL EXAMINATION:  Physical Exam  GENERAL:  73 y.o.-year-old patient lying in the bed with no acute distress.  EYES: Pupils equal, round, reactive to light and accommodation. No scleral icterus. Extraocular muscles intact.  HEENT: Head atraumatic, normocephalic. Oropharynx and nasopharynx clear. No oropharyngeal erythema, moist oral mucosa  NECK:  Supple, no jugular venous distention. No thyroid enlargement, no tenderness.  LUNGS: Normal breath sounds bilaterally, no wheezing, rales, rhonchi. No use of accessory muscles of respiration.  CARDIOVASCULAR: S1, S2 normal. No murmurs, rubs, or gallops.  ABDOMEN: Soft, nontender, nondistended. Bowel sounds present. No organomegaly or mass.  EXTREMITIES: No pedal edema, cyanosis, or clubbing. + 2 pedal & radial pulses b/l.   NEUROLOGIC: Cranial nerves II through XII are intact. No focal Motor or sensory deficits appreciated b/l PSYCHIATRIC: The patient is alert and oriented x 3. Good affect.  SKIN: No obvious rash, lesion, or ulcer.   LABORATORY PANEL:   CBC Recent Labs  Lab 11/28/18 1949  WBC 7.3   HGB 12.8*  HCT 39.7  PLT 264   ------------------------------------------------------------------------------------------------------------------  Chemistries  Recent Labs  Lab 11/28/18 1949  NA 140  K 3.9  CL 108  CO2 21*  GLUCOSE 116*  BUN 20  CREATININE 1.09  CALCIUM 8.4*  AST 23  ALT 20  ALKPHOS 48  BILITOT 0.5   ------------------------------------------------------------------------------------------------------------------  Cardiac Enzymes No results for input(s): TROPONINI in the last 168 hours. ------------------------------------------------------------------------------------------------------------------  RADIOLOGY:  Ct Angio Head W Or Wo Contrast  Result Date: 11/28/2018 CLINICAL DATA:  Left-sided weakness beginning 1400 hours. EXAM: CT ANGIOGRAPHY HEAD AND NECK TECHNIQUE: Multidetector CT imaging of the head and neck was performed using the standard protocol during bolus administration of intravenous contrast. Multiplanar CT image reconstructions and MIPs were obtained to evaluate the vascular anatomy. Carotid stenosis measurements (when applicable) are obtained utilizing NASCET criteria, using the distal internal carotid diameter as the denominator. CONTRAST:  3mL OMNIPAQUE IOHEXOL 350 MG/ML SOLN COMPARISON:  CT earlier same day. FINDINGS: CTA NECK FINDINGS Aortic arch: Normal except minimal atherosclerotic calcification. Right carotid system: Common carotid artery widely patent to the bifurcation. Carotid bifurcation shows soft and calcified plaque. Minimal diameter at the distal bulb is 2.5 mm. Compared to a more distal cervical ICA diameter of 4 mm, this indicates a 40% stenosis. Left carotid system: Common carotid artery widely patent to the bifurcation. Carotid bifurcation shows atherosclerotic change but no stenosis. Vertebral arteries: Both vertebral arteries are widely patent. Skeleton: Ordinary cervical spondylosis. Other neck: No mass or lymphadenopathy.  Upper chest: Normal Review of the MIP images confirms the above findings CTA HEAD FINDINGS Anterior circulation: Both internal carotid arteries are patent through the skull base and siphon regions. There is siphon atherosclerotic calcification but no stenosis. The anterior and middle cerebral vessels are patent without proximal stenosis, aneurysm or vascular malformation. Left anterior cerebral is dominant. Posterior circulation: Both vertebral arteries are patent to the basilar. 30% stenosis of the right V4 segment with moderate irregularity. Focal 50% stenosis of the left V4 segment. No basilar stenosis. Posterior circulation branch vessels appear. Venous sinuses: Patent and. Anatomic variants: None significant. Delayed phase: No abnormal enhancement. Review of the MIP images confirms the above findings IMPRESSION: No large or medium vessel occlusion or correctable proximal stenosis. Atherosclerotic change at both carotid bifurcation and ICA bulb regions. 40% stenosis of the distal ICA bulb on right. No measurable stenosis on the left. Atherosclerotic disease of the right V4 segment of the vertebral artery. Maximal stenosis estimated at 30%. Focal 50% stenosis of the left V4 segment. Electronically Signed   By: Nelson Chimes M.D.   On: 11/28/2018 20:24   Ct Angio Neck W And/or Wo Contrast  Result Date: 11/28/2018 CLINICAL DATA:  Left-sided weakness beginning 1400 hours. EXAM: CT ANGIOGRAPHY HEAD AND NECK TECHNIQUE: Multidetector CT imaging of the head and neck was performed using the standard protocol during bolus administration of intravenous contrast. Multiplanar CT image reconstructions and MIPs were obtained to evaluate the vascular anatomy. Carotid stenosis measurements (when applicable) are obtained utilizing NASCET criteria, using the distal internal carotid diameter as the denominator. CONTRAST:  25mL OMNIPAQUE IOHEXOL 350 MG/ML SOLN COMPARISON:  CT earlier same day. FINDINGS: CTA NECK FINDINGS Aortic  arch: Normal except minimal atherosclerotic calcification. Right carotid system: Common carotid artery widely patent to the bifurcation. Carotid bifurcation shows soft and calcified plaque. Minimal diameter at the distal bulb is 2.5 mm. Compared to a more distal cervical ICA diameter of 4 mm, this indicates a 40% stenosis. Left carotid system: Common carotid artery widely patent to the bifurcation. Carotid bifurcation shows atherosclerotic change but no stenosis. Vertebral arteries: Both vertebral arteries are widely patent. Skeleton: Ordinary cervical spondylosis. Other neck: No mass or lymphadenopathy. Upper chest: Normal Review of the MIP images confirms the above findings CTA HEAD FINDINGS Anterior circulation: Both internal carotid arteries are patent through the skull base and siphon regions. There is siphon atherosclerotic calcification but no stenosis. The anterior and middle cerebral vessels are patent without proximal stenosis, aneurysm or vascular malformation. Left anterior cerebral is dominant. Posterior circulation: Both vertebral arteries are patent to the basilar. 30% stenosis of the right V4 segment with moderate irregularity. Focal 50% stenosis of the left V4 segment. No basilar stenosis. Posterior circulation branch vessels appear. Venous sinuses: Patent and. Anatomic variants: None significant. Delayed phase: No abnormal enhancement. Review of the MIP images confirms the above findings IMPRESSION: No large or medium vessel occlusion or correctable proximal stenosis. Atherosclerotic  change at both carotid bifurcation and ICA bulb regions. 40% stenosis of the distal ICA bulb on right. No measurable stenosis on the left. Atherosclerotic disease of the right V4 segment of the vertebral artery. Maximal stenosis estimated at 30%. Focal 50% stenosis of the left V4 segment. Electronically Signed   By: Nelson Chimes M.D.   On: 11/28/2018 20:24   Ct Head Code Stroke Wo Contrast  Result Date:  11/28/2018 CLINICAL DATA:  Code stroke. Left-sided weakness beginning 1400 hours today. EXAM: CT HEAD WITHOUT CONTRAST TECHNIQUE: Contiguous axial images were obtained from the base of the skull through the vertex without intravenous contrast. COMPARISON:  None. FINDINGS: Brain: Mild age related volume loss. No evidence of old or acute infarction, mass lesion, hemorrhage, hydrocephalus or extra-axial collection. Vascular: There is atherosclerotic calcification of the major vessels at the base of the brain. Skull: Negative Sinuses/Orbits: Clear/normal Other: None ASPECTS ([place] Stroke Program Early CT Score) - Ganglionic level infarction (caudate, lentiform nuclei, internal capsule, insula, M1-M3 cortex): 7 - Supraganglionic infarction (M4-M6 cortex): 3 Total score (0-10 with 10 being normal): 10 IMPRESSION: 1. Normal head CT for age. 2. ASPECTS is 10. 3. These results were called by telephone at the time of interpretation on 11/28/2018 at 7:53 pm to Dr. Eula Listen , who verbally acknowledged these results. Electronically Signed   By: Nelson Chimes M.D.   On: 11/28/2018 19:54     IMPRESSION AND PLAN:   *TIA.  Symptoms resolved quickly.  He has received aspirin.  Patient will be admitted to medical floor with telemetry monitoring.  Neurochecks.  Ordered MRI and echocardiogram.  No significant carotid stenosis on CT of the neck.  Neurology consulted.  Sent message on epic.  *Hypertension.  Significantly elevated blood pressure on arrival which has improved.  Will restart home medications.  IV PRN medications.  *BPH.  On Avodart.  *Hyperlipidemia.  On simvastatin and fish oil.  Check fasting lipid profile in the morning  DVT prophylaxis with Lovenox  All the records are reviewed and case discussed with ED provider. Management plans discussed with the patient, family and they are in agreement.  CODE STATUS: DNR/DNI  TOTAL TIME TAKING CARE OF THIS PATIENT: 35 minutes.   Neita Carp M.D  on 11/28/2018 at 8:44 PM  Between 7am to 6pm - Pager - [phone]  After 6pm go to [email or web address] - password [email or web address] [facility]  Office  [phone]  CC: Primary care physician; Juline Patch, MD  Note: This dictation was prepared with Dragon dictation along with smaller phrase technology. Any transcriptional errors that result from this process are unintentional.

## 2018-11-28 NOTE — ED Provider Notes (Signed)
[facility] Emergency Department Provider Note  ____________________________________________  Time seen: Approximately 7:52 PM  I have reviewed the triage vital signs and the nursing notes.   HISTORY  Chief Complaint left side numbness    HPI Alfred Edwards is a 73 y.o. male with a history of HTN, HL on low-dose daily aspirin presenting with left-sided weakness.  The patient reports that around 2 PM he noticed weakness in his left hand.  Through the next several hours, he began to have weakness in the arm as well as weakness in the left leg making it more difficult for him to walk.  He felt that he was "leaning" to the left due to this weakness.  He has not had any headache, vision or speech changes, mental status changes.  He has not had any trauma.    Past Medical History:  Diagnosis Date  . BPH (benign prostatic hypertrophy)   . GERD (gastroesophageal reflux disease)   . Hyperlipidemia   . Hypertension     Patient Active Problem List   Diagnosis Date Noted  . Elevated PSA 08/28/2017  . Erectile dysfunction 08/28/2017  . Benign prostatic hyperplasia with lower urinary tract symptoms 08/28/2017  . Venous insufficiency 02/02/2017  . Gastroesophageal reflux disease without esophagitis 02/02/2017  . Essential hypertension 12/28/2015  . Hyperlipidemia 12/28/2015  . Nocturia associated with benign prostatic hypertrophy 12/28/2015  . Familial multiple lipoprotein-type hyperlipidemia 01/23/2015  . Anxiety disorder due to known physiological condition 01/23/2015  . Routine general medical examination at a health care facility 01/23/2015  . Adjustment reaction 01/23/2015  . Essential (primary) hypertension 01/23/2015  . Proteinuria 06/01/2012    Past Surgical History:  Procedure Laterality Date  . COLONOSCOPY  2005, 01/2015   Dr Vira Agar- cleared for 3 yrs  . TONSILLECTOMY    . UPPER GASTROINTESTINAL ENDOSCOPY  01/2015   Dr Vira Agar- erosions on  esophagus    Current Outpatient Rx  . Order #: [id number] Class: Historical Med  . Order #: [id number] Class: Historical Med  . Order #: [id number] Class: Historical Med  . Order #: [id number] Class: Historical Med  . Order #: [id number] Class: Print  . Order #: [id number] Class: Normal  . Order #: [id number] Class: Historical Med  . Order #: [id number] Class: Normal  . Order #: [id number] Class: OTC  . Order #: [id number] Class: Historical Med  . Order #: [id number] Class: Normal  . Order #: [id number] Class: Normal  . Order #: [id number] Class: Historical Med    Allergies Patient has no known allergies.  Family History  Problem Relation Age of Onset  . Heart disease Father   . Heart disease Brother     Social History Social History   Tobacco Use  . Smoking status: Former Smoker    Types: Cigarettes    Last attempt to quit: 09/13/1971    Years since quitting: 47.2  . Smokeless tobacco: Never Used  Substance Use Topics  . Alcohol use: Yes    Alcohol/week: 0.0 standard drinks    Comment: socially  . Drug use: No    Review of Systems Constitutional: No fever/chills.  No lightheadedness or syncope Eyes: No visual changes.  No blurred or double vision. ENT: No sore throat. No congestion or rhinorrhea. Cardiovascular: Denies chest pain. Denies palpitations. Respiratory: Denies shortness of breath.  No cough. Gastrointestinal: No abdominal pain.  No nausea, no vomiting.  No diarrhea.  No constipation. Genitourinary: Negative for dysuria. Musculoskeletal: Negative for back pain. Skin: Negative for rash. Neurological: Negative for headaches. No focal numbness, tingling.  Positive  left upper extremity and left lower extremity weakness.    ____________________________________________   PHYSICAL EXAM:  VITAL SIGNS: ED Triage Vitals [11/28/18 1930]  Enc Vitals Group     BP (!) 170/125     Pulse Rate 75     Resp 16     Temp 98 F (36.7 C)     Temp src      SpO2 96 %     Weight 168 lb  (76.2 kg)     Height 5\' 8"  (1.727 m)     Head Circumference      Peak Flow      Pain Score 0     Pain Loc      Pain Edu?      Excl. in [place]?     Constitutional: Alert and oriented. Answers questions appropriately. Eyes: Conjunctivae are normal.  EOMI. PERRLA.  No scleral icterus. Head: Atraumatic. Nose: No congestion/rhinnorhea. Mouth/Throat: Mucous membranes are moist.  Neck: No stridor.  Supple.  JVD. Cardiovascular: Normal rate, regular rhythm. No murmurs, rubs or gallops.  Respiratory: Normal respiratory effort.  No accessory muscle use or retractions. Lungs CTAB.  No wheezes, rales or ronchi. Gastrointestinal: Soft, nontender and nondistended.  No guarding or rebound.  No peritoneal signs. Musculoskeletal: No LE edema. No ttp in the calves or palpable cords.  Negative Homan's sign. Neurologic: Alert and oriented 3. Speech is clear. Face and smile symmetric. Tongue is midline.  EOMI.  PERRLA.  No horizontal or vertical nystagmus.  Mild left pronator drift. 5 out of 5 grip, biceps, triceps, R hip flexor, plantar flexion and dorsiflexion.  4+ out of 5 left hip flexor.  Normal sensation to light touch in the bilateral upper and lower extremities, and face. Normal heel-to-shin. Skin:  Skin is warm, dry and intact. No rash noted. Psychiatric: Mood and affect are normal. Speech and behavior are normal.  Normal judgement.  ____________________________________________   LABS (all labs ordered are listed, but only abnormal results are displayed)  Labs Reviewed  GLUCOSE, CAPILLARY - Abnormal; Notable for the following components:      Result Value   Glucose-Capillary 113 (*)    All other components within normal limits  PROTIME-INR  APTT  CBC  DIFFERENTIAL  COMPREHENSIVE METABOLIC PANEL  URINALYSIS, COMPLETE (UACMP) WITH MICROSCOPIC  CBG MONITORING, ED  I-STAT CREATININE, ED   ____________________________________________  EKG  ED ECG REPORT I, Anne-Caroline Mariea Clonts, the  attending physician, personally viewed and interpreted this ECG.   Date: 11/28/2018  EKG Time: 1948  Rate: 70  Rhythm: normal sinus rhythm  Axis: normal  Intervals:none  ST&T Change: No STEMI  ____________________________________________  RADIOLOGY  No results found.  ____________________________________________   PROCEDURES  Procedure(s) performed: None  Procedures  Critical Care performed: No ____________________________________________   INITIAL IMPRESSION / ASSESSMENT AND PLAN / ED COURSE  Pertinent labs & imaging results that were available during my care of the patient were reviewed by me and considered in my medical decision making (see chart for details).  73 y.o. male with a history of hypertension hyperlipidemia presenting with left-sided weakness.  Overall, the patient is hypertensive to 170/125 and a CT scan does not show any bleed, so we will continue to monitor his blood pressure but allow permissive hypertension at this time.  A CT angiogram of the head and neck have also been ordered.  I am concerned the patient symptoms may be due to acute CVA.  An aspirin has been ordered and the patient will be  admitted for further evaluation.  His laboratory studies including basic labs and UA are also pending.  His EKG is reassuring.  Admission at this time.  CRITICAL CARE Performed by: Eula Listen   Total critical care time: 35 minutes  Critical care time was exclusive of separately billable procedures and treating other patients.  Critical care was necessary to treat or prevent imminent or life-threatening deterioration.  Critical care was time spent personally by me on the following activities: development of treatment plan with patient and/or surrogate as well as nursing, discussions with consultants, evaluation of patient's response to treatment, examination of patient, obtaining history from patient or surrogate, ordering and performing treatments and  interventions, ordering and review of laboratory studies, ordering and review of radiographic studies, pulse oximetry and re-evaluation of patient's condition.   ____________________________________________  FINAL CLINICAL IMPRESSION(S) / ED DIAGNOSES  Final diagnoses:  Left-sided weakness         NEW MEDICATIONS STARTED DURING THIS VISIT:  New Prescriptions   No medications on file      Eula Listen, MD 11/28/18 1956

## 2018-11-28 NOTE — Plan of Care (Signed)
  Problem: Education: Goal: Knowledge of disease or condition will improve Outcome: Progressing Goal: Knowledge of secondary prevention will improve Outcome: Progressing Goal: Knowledge of patient specific risk factors addressed and post discharge goals established will improve Outcome: Progressing Goal: Individualized Educational Video(s) Outcome: Progressing   Problem: Coping: Goal: Will verbalize positive feelings about self Outcome: Progressing Goal: Will identify appropriate support needs Outcome: Progressing   Problem: Health Behavior/Discharge Planning: Goal: Ability to manage health-related needs will improve Outcome: Progressing   Problem: Self-Care: Goal: Ability to participate in self-care as condition permits will improve Outcome: Progressing Goal: Verbalization of feelings and concerns over difficulty with self-care will improve Outcome: Progressing Goal: Ability to communicate needs accurately will improve Outcome: Progressing   Problem: Nutrition: Goal: Risk of aspiration will decrease Outcome: Not Applicable Goal: Dietary intake will improve Outcome: Not Applicable   Problem: Ischemic Stroke/TIA Tissue Perfusion: Goal: Complications of ischemic stroke/TIA will be minimized Outcome: Progressing   Problem: Education: Goal: Knowledge of General Education information will improve Description Including pain rating scale, medication(s)/side effects and non-pharmacologic comfort measures Outcome: Progressing   Problem: Health Behavior/Discharge Planning: Goal: Ability to manage health-related needs will improve Outcome: Progressing   Problem: Clinical Measurements: Goal: Ability to maintain clinical measurements within normal limits will improve Outcome: Progressing Goal: Will remain free from infection Outcome: Progressing Goal: Diagnostic test results will improve Outcome: Not Applicable Goal: Respiratory complications will improve Outcome: Not  Applicable Goal: Cardiovascular complication will be avoided Outcome: Not Applicable   Problem: Activity: Goal: Risk for activity intolerance will decrease Outcome: Not Applicable   Problem: Nutrition: Goal: Adequate nutrition will be maintained Outcome: Not Applicable   Problem: Coping: Goal: Level of anxiety will decrease Outcome: Progressing   Problem: Elimination: Goal: Will not experience complications related to bowel motility Outcome: Not Applicable Goal: Will not experience complications related to urinary retention Outcome: Not Applicable   Problem: Pain Managment: Goal: General experience of comfort will improve Outcome: Progressing   Problem: Safety: Goal: Ability to remain free from injury will improve Outcome: Progressing

## 2018-11-28 NOTE — ED Notes (Signed)
Pt returned from CTA, neurologist on screen, pt symptoms completely resolved at this time, NIHSS negative.  No tPa at this time.  Pt to be admitted to hospital.  Pt understands POC.  Will monitor.

## 2018-11-28 NOTE — ED Notes (Signed)
ED TO INPATIENT HANDOFF REPORT  ED Nurse Name and Phone #: Anderson Malta [phone]  S Name/Age/Gender Alfred Edwards 73 y.o. male Room/Bed: ED12A/ED12A  Code Status   Code Status: DNR  Home/SNF/Other Home Patient oriented to: self, place, time and situation Is this baseline? Yes   Triage Complete: Triage complete  Chief Complaint Lt side numbness  Triage Note Pt states weakness that affects "my entire left side" that began between 14--1500 today. Pt is ambulatory without difficulty. Pt with strong equal grips to both hands, sensation intact to both sides.    Allergies No Known Allergies  Level of Care/Admitting Diagnosis ED Disposition    ED Disposition Condition Comment   Admit  Hospital Area: [facility] [[id number]]  Level of Care: Med-Surg [16]  Diagnosis: TIA (transient ischemic attack) [564332]  Admitting Physician: Hillary Bow [[id number]]  Attending Physician: Hillary Bow [[id number]  PT Class (Do Not Modify): Observation [104]  PT Acc Code (Do Not Modify): Observation [10022]       B Medical/Surgery History Past Medical History:  Diagnosis Date  . BPH (benign prostatic hypertrophy)   . GERD (gastroesophageal reflux disease)   . Hyperlipidemia   . Hypertension    Past Surgical History:  Procedure Laterality Date  . COLONOSCOPY  2005, 01/2015   Dr Vira Agar- cleared for 3 yrs  . TONSILLECTOMY    . UPPER GASTROINTESTINAL ENDOSCOPY  01/2015   Dr Vira Agar- erosions on esophagus     A IV Location/Drains/Wounds Patient Lines/Drains/Airways Status   Active Line/Drains/Airways    Name:   Placement date:   Placement time:   Site:   Days:   Peripheral IV 11/28/18 Right Forearm   11/28/18    1948    Forearm   less than 1          Intake/Output Last 24 hours No intake or output data in the 24 hours ending 11/28/18 2055  Labs/Imaging Results for orders placed or performed during the hospital encounter of 11/28/18 (from the past 48  hour(s))  Glucose, capillary     Status: Abnormal   Collection Time: 11/28/18  7:46 PM  Result Value Ref Range   Glucose-Capillary 113 (H) 70 - 99 mg/dL  Protime-INR     Status: None   Collection Time: 11/28/18  7:49 PM  Result Value Ref Range   Prothrombin Time 13.6 11.4 - 15.2 seconds   INR 1.1 0.8 - 1.2    Comment: (NOTE) INR goal varies based on device and disease states. Performed at [facility], [address] [place], [place] [address]   APTT     Status: None   Collection Time: 11/28/18  7:49 PM  Result Value Ref Range   aPTT 31 24 - 36 seconds    Comment: Performed at [facility], [address] [place], [place] [address]  CBC     Status: Abnormal   Collection Time: 11/28/18  7:49 PM  Result Value Ref Range   WBC 7.3 4.0 - 10.5 K/uL   RBC 4.68 4.22 - 5.81 MIL/uL   Hemoglobin 12.8 (L) 13.0 - 17.0 g/dL   HCT 39.7 39.0 - 52.0 %   MCV 84.8 80.0 - 100.0 fL   MCH 27.4 26.0 - 34.0 pg   MCHC 32.2 30.0 - 36.0 g/dL   RDW 14.8 11.5 - 15.5 %   Platelets 264 150 - 400 K/uL   nRBC 0.0 0.0 - 0.2 %    Comment: Performed at [facility], [address] [place], [place] [address]  Differential     Status: None   Collection Time: 11/28/18  7:49 PM  Result Value Ref Range   Neutrophils Relative % 66 %   Neutro Abs 4.8 1.7 - 7.7 K/uL   Lymphocytes Relative 22 %   Lymphs Abs 1.6 0.7 - 4.0 K/uL   Monocytes Relative 9 %   Monocytes Absolute 0.7 0.1 - 1.0 K/uL   Eosinophils Relative 2 %   Eosinophils Absolute 0.1 0.0 - 0.5 K/uL   Basophils Relative 1 %   Basophils Absolute 0.0 0.0 - 0.1 K/uL   Immature Granulocytes 0 %   Abs Immature Granulocytes 0.03 0.00 - 0.07 K/uL    Comment: Performed at [facility], [address] [place], [place] [address]  Comprehensive metabolic panel     Status: Abnormal   Collection Time: 11/28/18  7:49 PM  Result Value Ref Range   Sodium 140 135 - 145 mmol/L   Potassium 3.9 3.5 - 5.1 mmol/L   Chloride  108 98 - 111 mmol/L   CO2 21 (L) 22 - 32 mmol/L   Glucose, Bld 116 (H) 70 - 99 mg/dL   BUN 20 8 - 23 mg/dL   Creatinine, Ser 1.09 0.61 - 1.24 mg/dL   Calcium 8.4 (L) 8.9 - 10.3 mg/dL   Total Protein 7.2 6.5 - 8.1 g/dL   Albumin 3.8 3.5 - 5.0 g/dL   AST 23 15 - 41 U/L   ALT 20 0 - 44 U/L   Alkaline Phosphatase 48 38 - 126 U/L   Total Bilirubin 0.5 0.3 - 1.2 mg/dL   GFR calc non Af Amer >60 >60 mL/min   GFR calc Af Amer >60 >60 mL/min   Anion gap 11 5 - 15    Comment: Performed at [facility], [address] [place], [place] [address]  Urinalysis, Complete w Microscopic     Status: Abnormal   Collection Time: 11/28/18  8:21 PM  Result Value Ref Range   Color, Urine YELLOW (A) YELLOW   APPearance CLEAR (A) CLEAR   Specific Gravity, Urine 1.011 1.005 - 1.030   pH 5.0 5.0 - 8.0   Glucose, UA NEGATIVE NEGATIVE mg/dL   Hgb urine dipstick NEGATIVE NEGATIVE   Bilirubin Urine NEGATIVE NEGATIVE   Ketones, ur NEGATIVE NEGATIVE mg/dL   Protein, ur NEGATIVE NEGATIVE mg/dL   Nitrite NEGATIVE NEGATIVE   Leukocytes,Ua NEGATIVE NEGATIVE   RBC / HPF 0-5 0 - 5 RBC/hpf   WBC, UA 0-5 0 - 5 WBC/hpf   Bacteria, UA NONE SEEN NONE SEEN   Squamous Epithelial / LPF NONE SEEN 0 - 5   Mucus PRESENT     Comment: Performed at [facility], [address] [place], [place] [address]   Ct Angio Head W Or Wo Contrast  Result Date: 11/28/2018 CLINICAL DATA:  Left-sided weakness beginning 1400 hours. EXAM: CT ANGIOGRAPHY HEAD AND NECK TECHNIQUE: Multidetector CT imaging of the head and neck was performed using the standard protocol during bolus administration of intravenous contrast. Multiplanar CT image reconstructions and MIPs were obtained to evaluate the vascular anatomy. Carotid stenosis measurements (when applicable) are obtained utilizing NASCET criteria, using the distal internal carotid diameter as the denominator. CONTRAST:  80mL OMNIPAQUE IOHEXOL 350 MG/ML SOLN COMPARISON:  CT  earlier same day. FINDINGS: CTA NECK FINDINGS Aortic arch: Normal except minimal atherosclerotic calcification. Right carotid system: Common carotid artery widely patent to the bifurcation. Carotid bifurcation shows soft and  calcified plaque. Minimal diameter at the distal bulb is 2.5 mm. Compared to a more distal cervical ICA diameter of 4 mm, this indicates a 40% stenosis. Left carotid system: Common carotid artery widely patent to the bifurcation. Carotid bifurcation shows atherosclerotic change but no stenosis. Vertebral arteries: Both vertebral arteries are widely patent. Skeleton: Ordinary cervical spondylosis. Other neck: No mass or lymphadenopathy. Upper chest: Normal Review of the MIP images confirms the above findings CTA HEAD FINDINGS Anterior circulation: Both internal carotid arteries are patent through the skull base and siphon regions. There is siphon atherosclerotic calcification but no stenosis. The anterior and middle cerebral vessels are patent without proximal stenosis, aneurysm or vascular malformation. Left anterior cerebral is dominant. Posterior circulation: Both vertebral arteries are patent to the basilar. 30% stenosis of the right V4 segment with moderate irregularity. Focal 50% stenosis of the left V4 segment. No basilar stenosis. Posterior circulation branch vessels appear. Venous sinuses: Patent and. Anatomic variants: None significant. Delayed phase: No abnormal enhancement. Review of the MIP images confirms the above findings IMPRESSION: No large or medium vessel occlusion or correctable proximal stenosis. Atherosclerotic change at both carotid bifurcation and ICA bulb regions. 40% stenosis of the distal ICA bulb on right. No measurable stenosis on the left. Atherosclerotic disease of the right V4 segment of the vertebral artery. Maximal stenosis estimated at 30%. Focal 50% stenosis of the left V4 segment. Electronically Signed   By: Nelson Chimes M.D.   On: 11/28/2018 20:24   Ct  Angio Neck W And/or Wo Contrast  Result Date: 11/28/2018 CLINICAL DATA:  Left-sided weakness beginning 1400 hours. EXAM: CT ANGIOGRAPHY HEAD AND NECK TECHNIQUE: Multidetector CT imaging of the head and neck was performed using the standard protocol during bolus administration of intravenous contrast. Multiplanar CT image reconstructions and MIPs were obtained to evaluate the vascular anatomy. Carotid stenosis measurements (when applicable) are obtained utilizing NASCET criteria, using the distal internal carotid diameter as the denominator. CONTRAST:  52mL OMNIPAQUE IOHEXOL 350 MG/ML SOLN COMPARISON:  CT earlier same day. FINDINGS: CTA NECK FINDINGS Aortic arch: Normal except minimal atherosclerotic calcification. Right carotid system: Common carotid artery widely patent to the bifurcation. Carotid bifurcation shows soft and calcified plaque. Minimal diameter at the distal bulb is 2.5 mm. Compared to a more distal cervical ICA diameter of 4 mm, this indicates a 40% stenosis. Left carotid system: Common carotid artery widely patent to the bifurcation. Carotid bifurcation shows atherosclerotic change but no stenosis. Vertebral arteries: Both vertebral arteries are widely patent. Skeleton: Ordinary cervical spondylosis. Other neck: No mass or lymphadenopathy. Upper chest: Normal Review of the MIP images confirms the above findings CTA HEAD FINDINGS Anterior circulation: Both internal carotid arteries are patent through the skull base and siphon regions. There is siphon atherosclerotic calcification but no stenosis. The anterior and middle cerebral vessels are patent without proximal stenosis, aneurysm or vascular malformation. Left anterior cerebral is dominant. Posterior circulation: Both vertebral arteries are patent to the basilar. 30% stenosis of the right V4 segment with moderate irregularity. Focal 50% stenosis of the left V4 segment. No basilar stenosis. Posterior circulation branch vessels appear. Venous  sinuses: Patent and. Anatomic variants: None significant. Delayed phase: No abnormal enhancement. Review of the MIP images confirms the above findings IMPRESSION: No large or medium vessel occlusion or correctable proximal stenosis. Atherosclerotic change at both carotid bifurcation and ICA bulb regions. 40% stenosis of the distal ICA bulb on right. No measurable stenosis on the left. Atherosclerotic disease of the right V4 segment  of the vertebral artery. Maximal stenosis estimated at 30%. Focal 50% stenosis of the left V4 segment. Electronically Signed   By: Nelson Chimes M.D.   On: 11/28/2018 20:24   Ct Head Code Stroke Wo Contrast  Result Date: 11/28/2018 CLINICAL DATA:  Code stroke. Left-sided weakness beginning 1400 hours today. EXAM: CT HEAD WITHOUT CONTRAST TECHNIQUE: Contiguous axial images were obtained from the base of the skull through the vertex without intravenous contrast. COMPARISON:  None. FINDINGS: Brain: Mild age related volume loss. No evidence of old or acute infarction, mass lesion, hemorrhage, hydrocephalus or extra-axial collection. Vascular: There is atherosclerotic calcification of the major vessels at the base of the brain. Skull: Negative Sinuses/Orbits: Clear/normal Other: None ASPECTS ([place] Stroke Program Early CT Score) - Ganglionic level infarction (caudate, lentiform nuclei, internal capsule, insula, M1-M3 cortex): 7 - Supraganglionic infarction (M4-M6 cortex): 3 Total score (0-10 with 10 being normal): 10 IMPRESSION: 1. Normal head CT for age. 2. ASPECTS is 10. 3. These results were called by telephone at the time of interpretation on 11/28/2018 at 7:53 pm to Dr. Eula Listen , who verbally acknowledged these results. Electronically Signed   By: Nelson Chimes M.D.   On: 11/28/2018 19:54    Pending Labs Unresulted Labs (From admission, onward)    Start     Ordered   12/05/18 0500  Creatinine, serum  (enoxaparin (LOVENOX)    CrCl >/= 30 ml/min)  Weekly,   STAT     Comments:  while on enoxaparin therapy    11/28/18 2044   11/29/18 0500  Lipid panel  Tomorrow morning,   STAT    Comments:  Fasting    11/28/18 2044   11/28/18 2115  HIV Antibody (routine testing w rflx)  Once,   R     11/28/18 2115   11/28/18 2042  Hemoglobin A1c  Add-on,   AD     11/28/18 2044          Vitals/Pain Today's Vitals   11/28/18 1930 11/28/18 2019 11/28/18 2031  BP: (!) 170/125 (!) 151/79 (!) 168/77  Pulse: 75 73 78  Resp: 16 11 18   Temp: 98 F (36.7 C)    SpO2: 96% 97% 99%  Weight: 76.2 kg    Height: 5\' 8"  (1.727 m)    PainSc: 0-No pain      Isolation Precautions No active isolations  Medications Medications  sodium chloride flush (NS) 0.9 % injection 3 mL (3 mLs Intravenous Not Given 11/28/18 2026)   stroke: mapping our early stages of recovery book (has no administration in time range)  acetaminophen (TYLENOL) tablet 650 mg (has no administration in time range)    Or  acetaminophen (TYLENOL) solution 650 mg (has no administration in time range)    Or  acetaminophen (TYLENOL) suppository 650 mg (has no administration in time range)  enoxaparin (LOVENOX) injection 40 mg (has no administration in time range)  aspirin suppository 300 mg (has no administration in time range)    Or  aspirin tablet 325 mg (has no administration in time range)  aspirin chewable tablet 324 mg (324 mg Oral Given 11/28/18 2025)  iohexol (OMNIPAQUE) 350 MG/ML injection 75 mL (75 mLs Intravenous Contrast Given 11/28/18 1958)    Mobility walks Low fall risk   Focused Assessments Neuro Assessment Handoff:  Swallow screen pass? Yes    NIH Stroke Scale ( + Modified Stroke Scale Criteria)  Interval: Neuro change Level of Consciousness (1a.)   : Alert, keenly responsive LOC Questions (  1b. )   +: Answers both questions correctly LOC Commands (1c. )   + : Performs both tasks correctly Best Gaze (2. )  +: Normal Visual (3. )  +: No visual loss Facial Palsy (4. )    : Normal  symmetrical movements Motor Arm, Left (5a. )   +: No drift Motor Arm, Right (5b. )   +: No drift Motor Leg, Left (6a. )   +: No drift Motor Leg, Right (6b. )   +: No drift Limb Ataxia (7. ): Absent Sensory (8. )   +: Normal, no sensory loss Best Language (9. )   +: No aphasia Dysarthria (10. ): Normal Extinction/Inattention (11.)   +: No Abnormality Modified SS Total  +: 0 Complete NIHSS TOTAL: 0 Last date known well: 11/28/18 Last time known well: 1400 Neuro Assessment:   Neuro Checks:   Initial (11/28/18 1944)  Last Documented NIHSS Modified Score: 0 (11/28/18 2012) Has TPA been given? No If patient is a Neuro Trauma and patient is going to OR before floor call report to [place] nurse: [phone] or [phone]     R Recommendations: See Admitting Provider Note  Report given to:   Additional Notes:

## 2018-11-28 NOTE — Progress Notes (Signed)
Advance care planning  Purpose of Encounter TIA  Parties in Attendance patient  Patients Decisional capacity Patient is alert and oriented.  Able to make medical decisions.  His documented healthcare power of attorney is his brother Aodhan Scheidt  Discussed with patient regarding TIA, prognosis, treatment plan.  All questions answered.  CODE STATUS discussed.  Patient tells me he has a living will in place.  He wishes to be DNR/DNI.  Orders entered and CODE STATUS changed  DNR/DNI  Time spent - 17 minutes

## 2018-11-28 NOTE — ED Notes (Signed)
Jennifer W, aware of bed assigned  

## 2018-11-28 NOTE — ED Notes (Signed)
Spoke with dr. Mariea Clonts regarding pt's symptoms, order for code stroke. Anderson Malta pt's primary RN advised, per lisa ed tech, primary RN taking pt to ct. Ct tech lindsey notified of code stroke.

## 2018-11-28 NOTE — ED Notes (Signed)
Report to 1C RN, pt to be transported from MRI to floor.

## 2018-11-28 NOTE — ED Notes (Signed)
Patient transported to MRI 

## 2018-11-28 NOTE — ED Notes (Signed)
Attempted to call report, floor unable to take pt at this time.

## 2018-11-28 NOTE — Consult Note (Signed)
[facility] Tele[facility]rology Consult Services   Date of Service:   11/28/2018 19:59:41  Impression:     .  Transient Ischemic Attack  Comments/Sign-Out: here p/w last normal 2 pm of L arm weakness, and gait imbalance, h/o HTN, HLD, tobacco; worsened at 630 pm and resolved upon arrival to ED; CT head wnl; NOT tPA candidate b/c resolution of sx's  Mechanism of Stroke: Not Clear  Metrics: Last Known Well: 11/28/2018 14:00:00 [facility] Notification Time: 11/28/2018 19:59:41 Arrival Time: 11/28/2018 19:46:00 Stamp Time: 11/28/2018 19:59:41 Time First Login Attempt: 11/28/2018 20:08:00 Video Start Time: 11/28/2018 20:08:00  Symptoms: L arm leg weakness NIHSS Start Assessment Time: 11/28/2018 20:10:41 Patient is not a candidate for tPA. Patient was not deemed candidate for tPA thrombolytics because of Resolved symptoms (no residual disabling symptoms). Video End Time: 11/28/2018 20:13:15  CT head showed no acute hemorrhage or acute core infarct.  Clinical Presentation is not Suggestive of Large Vessel Occlusive Disease, Patient is not a Candidate for Thrombectomy  ED Physician notified of diagnostic impression and management plan on 11/28/2018 20:13:23  Our recommendations are outlined below.  Recommendations:     .  Activate Stroke Protocol Admission/Order Set     .  Stroke/Telemetry Floor     .  Neuro Checks     .  Bedside Swallow Eval     .  DVT Prophylaxis     .  IV Fluids, Normal Saline     .  Head of Bed Below 30 Degrees     .  Euglycemia and Avoid Hyperthermia (PRN Acetaminophen)     .  Antiplatelet Therapy Recommended     .  ASA 325 now then admit for stroke eval; IV fluid with NS; dvt prophy is ok; swallow eval  Routine Consultation with [facility] for Follow up Care  Sign Out:     .  Discussed with Emergency Department Provider    ------------------------------------------------------------------------------  History of  Present Illness: Patient is a 73 year old Male.  Patient was brought by private transportation with symptoms of L arm leg weakness  reports last normal 2 pm of L arm weakness, and gait imbalance, h/o HTN, HLD, tobacco  CT head showed no acute hemorrhage or acute core infarct.    Examination: 1A: Level of Consciousness - Alert; keenly responsive + 0 1B: Ask Month and Age - Both Questions Right + 0 1C: Blink Eyes & Squeeze Hands - Performs Both Tasks + 0 2: Test Horizontal Extraocular Movements - Normal + 0 3: Test Visual Fields - No Visual Loss + 0 4: Test Facial Palsy (Use Grimace if Obtunded) - Normal symmetry + 0 5A: Test Left Arm Motor Drift - No Drift for 10 Seconds + 0 5B: Test Right Arm Motor Drift - No Drift for 10 Seconds + 0 6A: Test Left Leg Motor Drift - No Drift for 5 Seconds + 0 6B: Test Right Leg Motor Drift - No Drift for 5 Seconds + 0 7: Test Limb Ataxia (FNF/Heel-Shin) - No Ataxia + 0 8: Test Sensation - Normal; No sensory loss + 0 9: Test Language/Aphasia - Normal; No aphasia + 0 10: Test Dysarthria - Normal + 0 11: Test Extinction/Inattention - No abnormality + 0  NIHSS Score: 0  Patient was informed the Neurology Consult would happen via TeleHealth consult by way of interactive audio and video telecommunications and consented to receiving care in this manner.  Due to the immediate potential for life-threatening deterioration due to underlying acute neurologic  illness, I spent 35 minutes providing critical care. This time includes time for face to face visit via telemedicine, review of medical records, imaging studies and discussion of findings with providers, the patient and/or family.   Dr Agustin Cree   [facility] [phone]  Case [id number]

## 2018-11-29 ENCOUNTER — Inpatient Hospital Stay (HOSPITAL_COMMUNITY)
Admit: 2018-11-29 | Discharge: 2018-11-29 | Disposition: A | Discharge status: Home / Self Care | Payer: Medicare Other | Attending: Internal Medicine | Admitting: Internal Medicine

## 2018-11-29 DIAGNOSIS — Z7982 Long term (current) use of aspirin: Secondary | ICD-10-CM | POA: Diagnosis not present

## 2018-11-29 DIAGNOSIS — G8194 Hemiplegia, unspecified affecting left nondominant side: Secondary | ICD-10-CM | POA: Diagnosis present

## 2018-11-29 DIAGNOSIS — G459 Transient cerebral ischemic attack, unspecified: Secondary | ICD-10-CM

## 2018-11-29 DIAGNOSIS — I340 Nonrheumatic mitral (valve) insufficiency: Secondary | ICD-10-CM

## 2018-11-29 DIAGNOSIS — R531 Weakness: Secondary | ICD-10-CM | POA: Diagnosis present

## 2018-11-29 DIAGNOSIS — I6381 Other cerebral infarction due to occlusion or stenosis of small artery: Secondary | ICD-10-CM | POA: Diagnosis present

## 2018-11-29 DIAGNOSIS — I351 Nonrheumatic aortic (valve) insufficiency: Secondary | ICD-10-CM | POA: Diagnosis not present

## 2018-11-29 DIAGNOSIS — I10 Essential (primary) hypertension: Secondary | ICD-10-CM | POA: Diagnosis present

## 2018-11-29 DIAGNOSIS — E785 Hyperlipidemia, unspecified: Secondary | ICD-10-CM | POA: Diagnosis present

## 2018-11-29 DIAGNOSIS — N400 Enlarged prostate without lower urinary tract symptoms: Secondary | ICD-10-CM | POA: Diagnosis present

## 2018-11-29 DIAGNOSIS — R29700 NIHSS score 0: Secondary | ICD-10-CM | POA: Diagnosis present

## 2018-11-29 DIAGNOSIS — K219 Gastro-esophageal reflux disease without esophagitis: Secondary | ICD-10-CM | POA: Diagnosis present

## 2018-11-29 DIAGNOSIS — Z87891 Personal history of nicotine dependence: Secondary | ICD-10-CM | POA: Diagnosis not present

## 2018-11-29 DIAGNOSIS — Z8249 Family history of ischemic heart disease and other diseases of the circulatory system: Secondary | ICD-10-CM | POA: Diagnosis not present

## 2018-11-29 DIAGNOSIS — Z79899 Other long term (current) drug therapy: Secondary | ICD-10-CM | POA: Diagnosis not present

## 2018-11-29 DIAGNOSIS — I639 Cerebral infarction, unspecified: Secondary | ICD-10-CM | POA: Diagnosis present

## 2018-11-29 DIAGNOSIS — Z8673 Personal history of transient ischemic attack (TIA), and cerebral infarction without residual deficits: Secondary | ICD-10-CM | POA: Diagnosis present

## 2018-11-29 DIAGNOSIS — Z66 Do not resuscitate: Secondary | ICD-10-CM | POA: Diagnosis present

## 2018-11-29 LAB — LIPID PANEL
Cholesterol: 140 mg/dL (ref 0–200)
HDL: 69 mg/dL (ref >40)
LDL Cholesterol: 62 mg/dL (ref 0–99)
Total CHOL/HDL Ratio: 2 RATIO
Triglycerides: 46 mg/dL (ref <150)
VLDL: 9 mg/dL (ref 0–40)

## 2018-11-29 LAB — ECHOCARDIOGRAM COMPLETE
Height: 68 in
Weight: 2678.4 oz

## 2018-11-29 MED ORDER — CLOPIDOGREL BISULFATE 75 MG PO TABS: 75.0000 mg | ORAL_TABLET | Freq: Every day | ORAL | 0 refills | Status: AC

## 2018-11-29 NOTE — Discharge Summary (Signed)
[facility] NAME: Alfred Edwards    MR#:  [id number]  DATE OF BIRTH:  06/19/1946  DATE OF ADMISSION:  11/28/2018 ADMITTING PHYSICIAN: Hillary Bow, MD  DATE OF DISCHARGE: 11/29/2018   PRIMARY CARE PHYSICIAN: Juline Patch, MD    ADMISSION DIAGNOSIS:  Left-sided weakness [R53.1]  DISCHARGE DIAGNOSIS:  Active Problems:   TIA (transient ischemic attack)   CVA (cerebral vascular accident) ([place])   SECONDARY DIAGNOSIS:   Past Medical History:  Diagnosis Date  . BPH (benign prostatic hypertrophy)   . GERD (gastroesophageal reflux disease)   . Hyperlipidemia   . Hypertension     HOSPITAL COURSE:   Patient came with left upper extremity numbness and weakness.  Noted to have an acute stroke on MRI.  He had stroke work-up done including echocardiogram which was negative for any acute findings and CT scan angiogram of head and neck which did not show any significant blockages.  His LDL was less than 70 and his blood pressure was under control.  He was seen by neurologist and physical therapy.  Suggested by neurologist to take dual antiplatelet therapy so added Plavix for up to 21 days and after that monotherapy can be continued.  He was advised to follow with primary care physician in the next week.  He had significant improvement in his symptoms within few hours after admission and was feeling much better.  DISCHARGE CONDITIONS:   Stable.  CONSULTS OBTAINED:  Treatment Team:  Alexis Goodell, MD  DRUG ALLERGIES:  No Known Allergies  DISCHARGE MEDICATIONS:   Allergies as of 11/29/2018   No Known Allergies     Medication List    TAKE these medications   aspirin 81 MG tablet Take 1 tablet by mouth daily.   CENTRUM SILVER ADULT 50+ PO Take 1 tablet by mouth daily.   clopidogrel 75 MG tablet Commonly known as:  Plavix Take 1 tablet (75 mg total) by mouth daily for 21 days.   CoQ-10 100 MG Caps Take 1  capsule by mouth daily.   dutasteride 0.5 MG capsule Commonly known as:  AVODART Take 1 capsule (0.5 mg total) by mouth daily. What changed:   when to take this additional instructions   Fish Oil 1000 MG Caps Take 1 capsule (1,000 mg total) by mouth 2 (two) times daily.   lisinopril 10 MG tablet Commonly known as:  PRINIVIL,ZESTRIL Take 1 tablet (10 mg total) by mouth daily.   NIFEdipine 30 MG 24 hr tablet Commonly known as:  PROCARDIA-XL/NIFEDICAL-XL TAKE 1 TABLET (30 MG TOTAL) BY MOUTH DAILY.   PriLOSEC OTC 20 MG tablet Generic drug:  omeprazole Take 20 mg by mouth daily.   sildenafil 20 MG tablet Commonly known as:  REVATIO 1-5 tabs as needed 1 hour prior to intercourse   simvastatin 20 MG tablet Commonly known as:  ZOCOR Take 1 tablet (20 mg total) by mouth daily.   Vitamin C 500 MG Caps Take 1 capsule by mouth 2 (two) times daily.   Vitamin D3 50 MCG (2000 UT) capsule Take 1 capsule by mouth daily.   Zinc 25 MG Tabs Take 25 mg by mouth daily.        DISCHARGE INSTRUCTIONS:    Follow with primary care physician in 1 week.  If you experience worsening of your admission symptoms, develop shortness of breath, life threatening emergency, suicidal or homicidal thoughts you must seek medical attention immediately by calling 911 or calling  your MD immediately  if symptoms less severe.  You Must read complete instructions/literature along with all the possible adverse reactions/side effects for all the Medicines you take and that have been prescribed to you. Take any new Medicines after you have completely understood and accept all the possible adverse reactions/side effects.   Please note  You were cared for by a hospitalist during your hospital stay. If you have any questions about your discharge medications or the care you received while you were in the hospital after you are discharged, you can call the unit and asked to speak with the hospitalist on call if  the hospitalist that took care of you is not available. Once you are discharged, your primary care physician will handle any further medical issues. Please note that NO REFILLS for any discharge medications will be authorized once you are discharged, as it is imperative that you return to your primary care physician (or establish a relationship with a primary care physician if you do not have one) for your aftercare needs so that they can reassess your need for medications and monitor your lab values.    Today   CHIEF COMPLAINT:   Chief Complaint  Patient presents with  . left side numbness    HISTORY OF PRESENT ILLNESS:  Alfred Edwards  is a 73 y.o. male with a known history of hypertension, BPH on baby aspirin and statin at home presents to the emergency room complaining of left hand numbness and weakness starting between 2:58 PM when he went grocery shopping.  Patient was able to walk normally.  Returned home and then noticed some left leg weakness.  No trouble swallowing or vision changes.  Never had similar symptoms in the past.  No history of stroke.  Arrived to the emergency room and now he feels back to normal with no numbness or weakness.  He had CT scan of the head which showed no stroke or bleed. CT of the head and neck were done which shows mild stenosis but nothing significant. Patient has been seen by tele neurology.   VITAL SIGNS:  Blood pressure 133/69, pulse 63, temperature 97.9 F (36.6 C), temperature source Oral, resp. rate 18, height 5\' 8"  (1.727 m), weight 75.9 kg, SpO2 99 %.  I/O:    Intake/Output Summary (Last 24 hours) at 11/29/2018 1609 Last data filed at 11/29/2018 1607 Gross per 24 hour  Intake 360 ml  Output -  Net 360 ml    PHYSICAL EXAMINATION:  GENERAL:  74 y.o.-year-old patient lying in the bed with no acute distress.  EYES: Pupils equal, round, reactive to light and accommodation. No scleral icterus. Extraocular muscles intact.  HEENT: Head  atraumatic, normocephalic. Oropharynx and nasopharynx clear.  NECK:  Supple, no jugular venous distention. No thyroid enlargement, no tenderness.  LUNGS: Normal breath sounds bilaterally, no wheezing, rales,rhonchi or crepitation. No use of accessory muscles of respiration.  CARDIOVASCULAR: S1, S2 normal. No murmurs, rubs, or gallops.  ABDOMEN: Soft, non-tender, non-distended. Bowel sounds present. No organomegaly or mass.  EXTREMITIES: No pedal edema, cyanosis, or clubbing.  NEUROLOGIC: Cranial nerves II through XII are intact. Muscle strength 5/5 in all extremities. Sensation intact. Gait not checked.  PSYCHIATRIC: The patient is alert and oriented x 3.  SKIN: No obvious rash, lesion, or ulcer.   DATA REVIEW:   CBC Recent Labs  Lab 11/28/18 1949  WBC 7.3  HGB 12.8*  HCT 39.7  PLT 264    Chemistries  Recent Labs  Lab  11/28/18 1949  NA 140  K 3.9  CL 108  CO2 21*  GLUCOSE 116*  BUN 20  CREATININE 1.09  CALCIUM 8.4*  AST 23  ALT 20  ALKPHOS 48  BILITOT 0.5    Cardiac Enzymes No results for input(s): TROPONINI in the last 168 hours.  Microbiology Results  No results found for this or any previous visit.  RADIOLOGY:  Ct Angio Head W Or Wo Contrast  Result Date: 11/28/2018 CLINICAL DATA:  Left-sided weakness beginning 1400 hours. EXAM: CT ANGIOGRAPHY HEAD AND NECK TECHNIQUE: Multidetector CT imaging of the head and neck was performed using the standard protocol during bolus administration of intravenous contrast. Multiplanar CT image reconstructions and MIPs were obtained to evaluate the vascular anatomy. Carotid stenosis measurements (when applicable) are obtained utilizing NASCET criteria, using the distal internal carotid diameter as the denominator. CONTRAST:  31mL OMNIPAQUE IOHEXOL 350 MG/ML SOLN COMPARISON:  CT earlier same day. FINDINGS: CTA NECK FINDINGS Aortic arch: Normal except minimal atherosclerotic calcification. Right carotid system: Common carotid artery  widely patent to the bifurcation. Carotid bifurcation shows soft and calcified plaque. Minimal diameter at the distal bulb is 2.5 mm. Compared to a more distal cervical ICA diameter of 4 mm, this indicates a 40% stenosis. Left carotid system: Common carotid artery widely patent to the bifurcation. Carotid bifurcation shows atherosclerotic change but no stenosis. Vertebral arteries: Both vertebral arteries are widely patent. Skeleton: Ordinary cervical spondylosis. Other neck: No mass or lymphadenopathy. Upper chest: Normal Review of the MIP images confirms the above findings CTA HEAD FINDINGS Anterior circulation: Both internal carotid arteries are patent through the skull base and siphon regions. There is siphon atherosclerotic calcification but no stenosis. The anterior and middle cerebral vessels are patent without proximal stenosis, aneurysm or vascular malformation. Left anterior cerebral is dominant. Posterior circulation: Both vertebral arteries are patent to the basilar. 30% stenosis of the right V4 segment with moderate irregularity. Focal 50% stenosis of the left V4 segment. No basilar stenosis. Posterior circulation branch vessels appear. Venous sinuses: Patent and. Anatomic variants: None significant. Delayed phase: No abnormal enhancement. Review of the MIP images confirms the above findings IMPRESSION: No large or medium vessel occlusion or correctable proximal stenosis. Atherosclerotic change at both carotid bifurcation and ICA bulb regions. 40% stenosis of the distal ICA bulb on right. No measurable stenosis on the left. Atherosclerotic disease of the right V4 segment of the vertebral artery. Maximal stenosis estimated at 30%. Focal 50% stenosis of the left V4 segment. Electronically Signed   By: Nelson Chimes M.D.   On: 11/28/2018 20:24   Ct Angio Neck W And/or Wo Contrast  Result Date: 11/28/2018 CLINICAL DATA:  Left-sided weakness beginning 1400 hours. EXAM: CT ANGIOGRAPHY HEAD AND NECK  TECHNIQUE: Multidetector CT imaging of the head and neck was performed using the standard protocol during bolus administration of intravenous contrast. Multiplanar CT image reconstructions and MIPs were obtained to evaluate the vascular anatomy. Carotid stenosis measurements (when applicable) are obtained utilizing NASCET criteria, using the distal internal carotid diameter as the denominator. CONTRAST:  73mL OMNIPAQUE IOHEXOL 350 MG/ML SOLN COMPARISON:  CT earlier same day. FINDINGS: CTA NECK FINDINGS Aortic arch: Normal except minimal atherosclerotic calcification. Right carotid system: Common carotid artery widely patent to the bifurcation. Carotid bifurcation shows soft and calcified plaque. Minimal diameter at the distal bulb is 2.5 mm. Compared to a more distal cervical ICA diameter of 4 mm, this indicates a 40% stenosis. Left carotid system: Common carotid artery widely patent  to the bifurcation. Carotid bifurcation shows atherosclerotic change but no stenosis. Vertebral arteries: Both vertebral arteries are widely patent. Skeleton: Ordinary cervical spondylosis. Other neck: No mass or lymphadenopathy. Upper chest: Normal Review of the MIP images confirms the above findings CTA HEAD FINDINGS Anterior circulation: Both internal carotid arteries are patent through the skull base and siphon regions. There is siphon atherosclerotic calcification but no stenosis. The anterior and middle cerebral vessels are patent without proximal stenosis, aneurysm or vascular malformation. Left anterior cerebral is dominant. Posterior circulation: Both vertebral arteries are patent to the basilar. 30% stenosis of the right V4 segment with moderate irregularity. Focal 50% stenosis of the left V4 segment. No basilar stenosis. Posterior circulation branch vessels appear. Venous sinuses: Patent and. Anatomic variants: None significant. Delayed phase: No abnormal enhancement. Review of the MIP images confirms the above findings  IMPRESSION: No large or medium vessel occlusion or correctable proximal stenosis. Atherosclerotic change at both carotid bifurcation and ICA bulb regions. 40% stenosis of the distal ICA bulb on right. No measurable stenosis on the left. Atherosclerotic disease of the right V4 segment of the vertebral artery. Maximal stenosis estimated at 30%. Focal 50% stenosis of the left V4 segment. Electronically Signed   By: Nelson Chimes M.D.   On: 11/28/2018 20:24   Mr Brain Wo Contrast  Result Date: 11/28/2018 CLINICAL DATA:  Initial evaluation for acute left-sided numbness. EXAM: MRI HEAD WITHOUT CONTRAST TECHNIQUE: Multiplanar, multiecho pulse sequences of the brain and surrounding structures were obtained without intravenous contrast. COMPARISON:  Prior CT and CTA from earlier the same day. FINDINGS: Brain: Generalized age appropriate cerebral atrophy. Patchy T2/FLAIR hyperintensity within the periventricular deep white matter both cerebral hemispheres most consistent with chronic small vessel ischemic disease, mild for age. 17 mm curvilinear focus of restricted diffusion extending from the subinsular white matter towards the posterior right basal ganglia compatible with acute ischemic lacunar type infarct (series 5, image 36). No associated hemorrhage or mass effect. No other evidence for acute or subacute ischemia. Gray-white matter differentiation otherwise maintained. No encephalomalacia to suggest chronic cortical infarction. No foci of susceptibility artifact to suggest acute or chronic intracranial hemorrhage. No mass lesion, midline shift or mass effect. No hydrocephalus. No extra-axial fluid collection. Pituitary gland suprasellar region normal. Midline structures intact and normal. Vascular: Major intracranial vascular flow voids maintained. Skull and upper cervical spine: Craniocervical junction within normal limits. Upper cervical spine normal. Bone marrow signal intensity within normal limits. No scalp soft  tissue abnormality. Sinuses/Orbits: Globes and orbital soft tissues within normal limits. Mild scattered mucosal thickening within the ethmoidal air cells. Paranasal sinuses are otherwise clear. No mastoid effusion. Inner ear structures normal. Other: None. IMPRESSION: 1. 17 mm acute ischemic lacunar type infarct involving the right subinsular white matter, extending into the right basal ganglia. No associated hemorrhage. 2. Underlying age-appropriate cerebral atrophy with mild chronic small vessel ischemic disease. Electronically Signed   By: Jeannine Boga M.D.   On: 11/28/2018 22:43   Ct Head Code Stroke Wo Contrast  Result Date: 11/28/2018 CLINICAL DATA:  Code stroke. Left-sided weakness beginning 1400 hours today. EXAM: CT HEAD WITHOUT CONTRAST TECHNIQUE: Contiguous axial images were obtained from the base of the skull through the vertex without intravenous contrast. COMPARISON:  None. FINDINGS: Brain: Mild age related volume loss. No evidence of old or acute infarction, mass lesion, hemorrhage, hydrocephalus or extra-axial collection. Vascular: There is atherosclerotic calcification of the major vessels at the base of the brain. Skull: Negative Sinuses/Orbits: Clear/normal Other: None  ASPECTS [facility] Stroke Program Early CT Score) - Ganglionic level infarction (caudate, lentiform nuclei, internal capsule, insula, M1-M3 cortex): 7 - Supraganglionic infarction (M4-M6 cortex): 3 Total score (0-10 with 10 being normal): 10 IMPRESSION: 1. Normal head CT for age. 2. ASPECTS is 10. 3. These results were called by telephone at the time of interpretation on 11/28/2018 at 7:53 pm to Dr. Eula Listen , who verbally acknowledged these results. Electronically Signed   By: Nelson Chimes M.D.   On: 11/28/2018 19:54    EKG:   Orders placed or performed during the hospital encounter of 11/28/18  . ED EKG  . ED EKG  . EKG 12-Lead  . EKG 12-Lead      Management plans discussed with the patient,  family and they are in agreement.  CODE STATUS:     Code Status Orders  (From admission, onward)         Start     Ordered   11/28/18 2042  Do not attempt resuscitation (DNR)  Continuous    Question Answer Comment  In the event of cardiac or respiratory ARREST Do not call a "code blue"   In the event of cardiac or respiratory ARREST Do not perform Intubation, CPR, defibrillation or ACLS   In the event of cardiac or respiratory ARREST Use medication by any route, position, wound care, and other measures to relive pain and suffering. May use oxygen, suction and manual treatment of airway obstruction as needed for comfort.      11/28/18 2044        Code Status History    This patient has a current code status but no historical code status.      TOTAL TIME TAKING CARE OF THIS PATIENT: 35 minutes.    Vaughan Basta M.D on 11/29/2018 at 4:09 PM  Between 7am to 6pm - Pager - [phone]  After 6pm go to [email or web address] - password [email or web address] [facility]  Office  [phone]  CC: Primary care physician; Juline Patch, MD   Note: This dictation was prepared with Dragon dictation along with smaller phrase technology. Any transcriptional errors that result from this process are unintentional.

## 2018-11-29 NOTE — Consult Note (Signed)
Referring Physician: Vaughan Basta, MD    Chief Complaint: Left-sided weakness  HPI: Alfred Edwards is an 73 y.o. male with pertinent past medical history of hyperlipidemia, and hypertension presenting to the ED on 11/28/2018 with a left sided weakness. He reports onset of symptoms since 11/28/2018  at 2 pm and has progressively worsened over several hours. Denied associated altered sensorium, speech abnormality, cranial nerve deficit, seizures, focal sensory deficits, diplopia, nausea or vomiting, syncope or LOC, paresthesia (numbness, tingling, pins-and-needles sensation), dizziness or headache. He however endorsed feeling of unsteadiness with clumsiness as if he was swaying to the left. On arrival to the ED, he was afebrile with blood pressure 170/134mm Hg and pulse rate 75 beats/min. There were no focal neurological deficits; he was alert and oriented x4, and he did not demonstrate any memory deficits.  His symptoms had nearly resolved therefore code stroke was initiated and patient was evaluated by a tele-neurologist.  Initial NIH stroke scale was 0. A non-contrast head CT was obtained and did not show acute hemorrhage or acute cord infarct.  It was determined that the clinical presentation was not suggestive of large vessel occlusive disease therefore patient was not a candidate for thrombectomy.  He was also not deemed candidate for tPA thrombolytics because of resolved symptoms with no residual disabling symptoms.  CTA head and neck did not show large vessel occlusion, hemodynamically significant stenosis or aneurysm.  Patient was admitted for further stroke work-up and management.  Follow-up MRI of the brain showed 17 mm acute ischemic lacunar type infarct involving the right subinsular white matter, extending into the right basal ganglia. During hospitalization, the blood work, including  lipid panel, and HbA1c were normal. The patient has remained free from recurrent events and maintains a  secondary prevention medication regimen including 81mg  of aspirin and 20mg  of simvastatin.  Date last known well: Date: 11/28/2018 Time last known well: Time: 14:00 tPA Given: [id number] symptoms with no residual disabling symptoms  Past Medical History:  Diagnosis Date  . BPH (benign prostatic hypertrophy)   . GERD (gastroesophageal reflux disease)   . Hyperlipidemia   . Hypertension     Past Surgical History:  Procedure Laterality Date  . COLONOSCOPY  2005, 01/2015   Dr Vira Agar- cleared for 3 yrs  . TONSILLECTOMY    . UPPER GASTROINTESTINAL ENDOSCOPY  01/2015   Dr Vira Agar- erosions on esophagus    Family History  Problem Relation Age of Onset  . Heart disease Father   . Heart disease Brother    Social History:  reports that he quit smoking about 47 years ago. His smoking use included cigarettes. He has never used smokeless tobacco. He reports current alcohol use. He reports that he does not use drugs.  Allergies: No Known Allergies  Medications:  I have reviewed the patient's current medications. Prior to Admission:  Medications Prior to Admission  Medication Sig Dispense Refill Last Dose  . Ascorbic Acid (VITAMIN C) 500 MG CAPS Take 1 capsule by mouth 2 (two) times daily.    11/28/2018 at 0800  . aspirin 81 MG tablet Take 1 tablet by mouth daily.   11/28/2018 at 0800  . Cholecalciferol (VITAMIN D3) 2000 UNITS capsule Take 1 capsule by mouth daily.   11/28/2018 at 0800  . Coenzyme Q10 (COQ-10) 100 MG CAPS Take 1 capsule by mouth daily.   11/28/2018 at 0800  . dutasteride (AVODART) 0.5 MG capsule Take 1 capsule (0.5 mg total) by mouth daily. (Patient taking differently: Take 0.5  mg by mouth 3 (three) times a week. Monday, Wednesday and Friday) 90 capsule 1 11/28/2018 at 0800  . lisinopril (PRINIVIL,ZESTRIL) 10 MG tablet Take 1 tablet (10 mg total) by mouth daily. 90 tablet 1 11/28/2018 at 0800  . Multiple Vitamins-Minerals (CENTRUM SILVER ADULT 50+ PO) Take 1 tablet by mouth  daily.   11/28/2018 at 0800  . NIFEdipine (PROCARDIA-XL/NIFEDICAL-XL) 30 MG 24 hr tablet TAKE 1 TABLET (30 MG TOTAL) BY MOUTH DAILY. 90 tablet 1 11/28/2018 at 0800  . Omega-3 Fatty Acids (FISH OIL) 1000 MG CAPS Take 1 capsule (1,000 mg total) by mouth 2 (two) times daily. 30 capsule 11 11/28/2018 at 0800  . omeprazole (PRILOSEC OTC) 20 MG tablet Take 20 mg by mouth daily.    11/28/2018 at 0800  . simvastatin (ZOCOR) 20 MG tablet Take 1 tablet (20 mg total) by mouth daily. 90 tablet 1 11/27/2018 at 2000  . Zinc 25 MG TABS Take 25 mg by mouth daily.    11/27/2018 at 2000  . sildenafil (REVATIO) 20 MG tablet 1-5 tabs as needed 1 hour prior to intercourse 90 tablet 1 prn at prn   Scheduled: . aspirin  300 mg Rectal Daily   Or  . aspirin EC  325 mg Oral Daily  . [START ON 11/30/2018] dutasteride  0.5 mg Oral Once per day on Mon Wed Fri  . enoxaparin (LOVENOX) injection  40 mg Subcutaneous Q24H  . lisinopril  10 mg Oral Daily  . NIFEdipine  30 mg Oral Daily  . omega-3 acid ethyl esters  1 g Oral BID  . pantoprazole  20 mg Oral Daily  . simvastatin  20 mg Oral Daily  . sodium chloride flush  3 mL Intravenous Once    ROS: History obtained from the patient   General ROS: negative for - chills, fatigue, fever, night sweats, weight gain or weight loss Psychological ROS: negative for - behavioral disorder, hallucinations, memory difficulties, mood swings or suicidal ideation Ophthalmic ROS: negative for - blurry vision, double vision, eye pain or loss of vision ENT ROS: negative for - epistaxis, nasal discharge, oral lesions, sore throat, tinnitus or vertigo Allergy and Immunology ROS: negative for - hives or itchy/watery eyes Hematological and Lymphatic ROS: negative for - bleeding problems, bruising or swollen lymph nodes Endocrine ROS: negative for - galactorrhea, hair pattern changes, polydipsia/polyuria or temperature intolerance Respiratory ROS: negative for - cough, hemoptysis, shortness of  breath or wheezing Cardiovascular ROS: negative for - chest pain, dyspnea on exertion, edema or irregular heartbeat Gastrointestinal ROS: negative for - abdominal pain, diarrhea, hematemesis, nausea/vomiting or stool incontinence Genito-Urinary ROS: negative for - dysuria, hematuria, incontinence or urinary frequency/urgency Musculoskeletal ROS: negative for - joint swelling or muscular weakness Neurological ROS: as noted in HPI Dermatological ROS: negative for rash and skin lesion changes  Physical Examination: Blood pressure 138/77, pulse 60, temperature (!) 97.2 F (36.2 C), temperature source Oral, resp. rate 18, height 5\' 8"  (1.727 m), weight 75.9 kg, SpO2 98 %.   HEENT-  Normocephalic, no lesions, without obvious abnormality.  Normal external eye and conjunctiva.  Normal TM's bilaterally.  Normal auditory canals and external ears. Normal external nose, mucus membranes and septum.  Normal pharynx. Cardiovascular- S1, S2 normal, pulses palpable throughout   Lungs- chest clear, no wheezing, rales, normal symmetric air entry Abdomen- soft, non-tender; bowel sounds normal; no masses,  no organomegaly Extremities- no edema Lymph-no adenopathy palpable Musculoskeletal-no joint tenderness, deformity or swelling Skin-warm and dry, no hyperpigmentation, vitiligo, or suspicious  lesions  Neurological Exam   Mental Status: Alert, oriented, thought content appropriate.  Speech fluent without evidence of aphasia.  Able to follow 3 step commands without difficulty. Attention span and concentration seemed appropriate  Cranial Nerves: II: Discs flat bilaterally; Visual fields grossly normal, pupils equal, round, reactive to light and accommodation III,IV, VI: ptosis not present, extra-ocular motions intact bilaterally V,VII: smile symmetric, facial light touch sensation intact VIII: hearing normal bilaterally IX,X: gag reflex present XI: bilateral shoulder shrug XII: midline tongue  extension Motor: Right :  Upper extremity   5/5 Without pronator drift      Left: Upper extremity   5/5 without pronator drift Right:   Lower extremity   5/5                                          Left: Lower extremity   5/5 Tone and bulk:normal tone throughout; no atrophy noted Sensory: Pinprick and light touch intact bilaterally Deep Tendon Reflexes: 2+ and symmetric throughout Plantars: Right:  Downgoing                             Left: Downgoing Cerebellar: Finger-to-nose testing intact bilaterally. Heel to shin testing normal bilaterally Gait: not tested due to safety concerns  Data Reviewed  Laboratory Studies:  Basic Metabolic Panel: Recent Labs  Lab 11/28/18 1949  NA 140  K 3.9  CL 108  CO2 21*  GLUCOSE 116*  BUN 20  CREATININE 1.09  CALCIUM 8.4*    Liver Function Tests: Recent Labs  Lab 11/28/18 1949  AST 23  ALT 20  ALKPHOS 48  BILITOT 0.5  PROT 7.2  ALBUMIN 3.8   No results for input(s): LIPASE, AMYLASE in the last 168 hours. No results for input(s): AMMONIA in the last 168 hours.  CBC: Recent Labs  Lab 11/28/18 1949  WBC 7.3  NEUTROABS 4.8  HGB 12.8*  HCT 39.7  MCV 84.8  PLT 264    Cardiac Enzymes: No results for input(s): CKTOTAL, CKMB, CKMBINDEX, TROPONINI in the last 168 hours.  BNP: Invalid input(s): POCBNP  CBG: Recent Labs  Lab 11/28/18 1946  GLUCAP 113*    Microbiology: No results found for this or any previous visit.  Coagulation Studies: Recent Labs    11/28/18 1949  LABPROT 13.6  INR 1.1    Urinalysis:  Recent Labs  Lab 11/28/18 2021  COLORURINE YELLOW*  LABSPEC 1.011  PHURINE 5.0  GLUCOSEU NEGATIVE  HGBUR NEGATIVE  BILIRUBINUR NEGATIVE  KETONESUR NEGATIVE  PROTEINUR NEGATIVE  NITRITE NEGATIVE  LEUKOCYTESUR NEGATIVE    Lipid Panel:    Component Value Date/Time   CHOL 140 11/29/2018 0410   CHOL 165 08/22/2018 1115   TRIG 46 11/29/2018 0410   HDL 69 11/29/2018 0410   HDL 87 08/22/2018 1115    CHOLHDL 2.0 11/29/2018 0410   VLDL 9 11/29/2018 0410   LDLCALC 62 11/29/2018 0410   LDLCALC 71 08/22/2018 1115    HgbA1C:  Lab Results  Component Value Date   HGBA1C 5.5 11/28/2018    Urine Drug Screen:  No results found for: LABOPIA, COCAINSCRNUR, LABBENZ, AMPHETMU, THCU, LABBARB  Alcohol Level: No results for input(s): ETH in the last 168 hours.  Other results: EKG: normal EKG, normal sinus rhythm, unchanged from previous tracings. Vent. rate 70 BPM PR interval * ms QRS  duration 89 ms QT/QTc 375/405 ms P-R-T axes 28 34 13  Imaging: Ct Angio Head W Or Wo Contrast  Result Date: 11/28/2018 CLINICAL DATA:  Left-sided weakness beginning 1400 hours. EXAM: CT ANGIOGRAPHY HEAD AND NECK TECHNIQUE: Multidetector CT imaging of the head and neck was performed using the standard protocol during bolus administration of intravenous contrast. Multiplanar CT image reconstructions and MIPs were obtained to evaluate the vascular anatomy. Carotid stenosis measurements (when applicable) are obtained utilizing NASCET criteria, using the distal internal carotid diameter as the denominator. CONTRAST:  66mL OMNIPAQUE IOHEXOL 350 MG/ML SOLN COMPARISON:  CT earlier same day. FINDINGS: CTA NECK FINDINGS Aortic arch: Normal except minimal atherosclerotic calcification. Right carotid system: Common carotid artery widely patent to the bifurcation. Carotid bifurcation shows soft and calcified plaque. Minimal diameter at the distal bulb is 2.5 mm. Compared to a more distal cervical ICA diameter of 4 mm, this indicates a 40% stenosis. Left carotid system: Common carotid artery widely patent to the bifurcation. Carotid bifurcation shows atherosclerotic change but no stenosis. Vertebral arteries: Both vertebral arteries are widely patent. Skeleton: Ordinary cervical spondylosis. Other neck: No mass or lymphadenopathy. Upper chest: Normal Review of the MIP images confirms the above findings CTA HEAD FINDINGS Anterior  circulation: Both internal carotid arteries are patent through the skull base and siphon regions. There is siphon atherosclerotic calcification but no stenosis. The anterior and middle cerebral vessels are patent without proximal stenosis, aneurysm or vascular malformation. Left anterior cerebral is dominant. Posterior circulation: Both vertebral arteries are patent to the basilar. 30% stenosis of the right V4 segment with moderate irregularity. Focal 50% stenosis of the left V4 segment. No basilar stenosis. Posterior circulation branch vessels appear. Venous sinuses: Patent and. Anatomic variants: None significant. Delayed phase: No abnormal enhancement. Review of the MIP images confirms the above findings IMPRESSION: No large or medium vessel occlusion or correctable proximal stenosis. Atherosclerotic change at both carotid bifurcation and ICA bulb regions. 40% stenosis of the distal ICA bulb on right. No measurable stenosis on the left. Atherosclerotic disease of the right V4 segment of the vertebral artery. Maximal stenosis estimated at 30%. Focal 50% stenosis of the left V4 segment. Electronically Signed   By: Nelson Chimes M.D.   On: 11/28/2018 20:24   Ct Angio Neck W And/or Wo Contrast  Result Date: 11/28/2018 CLINICAL DATA:  Left-sided weakness beginning 1400 hours. EXAM: CT ANGIOGRAPHY HEAD AND NECK TECHNIQUE: Multidetector CT imaging of the head and neck was performed using the standard protocol during bolus administration of intravenous contrast. Multiplanar CT image reconstructions and MIPs were obtained to evaluate the vascular anatomy. Carotid stenosis measurements (when applicable) are obtained utilizing NASCET criteria, using the distal internal carotid diameter as the denominator. CONTRAST:  77mL OMNIPAQUE IOHEXOL 350 MG/ML SOLN COMPARISON:  CT earlier same day. FINDINGS: CTA NECK FINDINGS Aortic arch: Normal except minimal atherosclerotic calcification. Right carotid system: Common carotid  artery widely patent to the bifurcation. Carotid bifurcation shows soft and calcified plaque. Minimal diameter at the distal bulb is 2.5 mm. Compared to a more distal cervical ICA diameter of 4 mm, this indicates a 40% stenosis. Left carotid system: Common carotid artery widely patent to the bifurcation. Carotid bifurcation shows atherosclerotic change but no stenosis. Vertebral arteries: Both vertebral arteries are widely patent. Skeleton: Ordinary cervical spondylosis. Other neck: No mass or lymphadenopathy. Upper chest: Normal Review of the MIP images confirms the above findings CTA HEAD FINDINGS Anterior circulation: Both internal carotid arteries are patent through the skull base  and siphon regions. There is siphon atherosclerotic calcification but no stenosis. The anterior and middle cerebral vessels are patent without proximal stenosis, aneurysm or vascular malformation. Left anterior cerebral is dominant. Posterior circulation: Both vertebral arteries are patent to the basilar. 30% stenosis of the right V4 segment with moderate irregularity. Focal 50% stenosis of the left V4 segment. No basilar stenosis. Posterior circulation branch vessels appear. Venous sinuses: Patent and. Anatomic variants: None significant. Delayed phase: No abnormal enhancement. Review of the MIP images confirms the above findings IMPRESSION: No large or medium vessel occlusion or correctable proximal stenosis. Atherosclerotic change at both carotid bifurcation and ICA bulb regions. 40% stenosis of the distal ICA bulb on right. No measurable stenosis on the left. Atherosclerotic disease of the right V4 segment of the vertebral artery. Maximal stenosis estimated at 30%. Focal 50% stenosis of the left V4 segment. Electronically Signed   By: Nelson Chimes M.D.   On: 11/28/2018 20:24   Mr Brain Wo Contrast  Result Date: 11/28/2018 CLINICAL DATA:  Initial evaluation for acute left-sided numbness. EXAM: MRI HEAD WITHOUT CONTRAST  TECHNIQUE: Multiplanar, multiecho pulse sequences of the brain and surrounding structures were obtained without intravenous contrast. COMPARISON:  Prior CT and CTA from earlier the same day. FINDINGS: Brain: Generalized age appropriate cerebral atrophy. Patchy T2/FLAIR hyperintensity within the periventricular deep white matter both cerebral hemispheres most consistent with chronic small vessel ischemic disease, mild for age. 17 mm curvilinear focus of restricted diffusion extending from the subinsular white matter towards the posterior right basal ganglia compatible with acute ischemic lacunar type infarct (series 5, image 36). No associated hemorrhage or mass effect. No other evidence for acute or subacute ischemia. Gray-white matter differentiation otherwise maintained. No encephalomalacia to suggest chronic cortical infarction. No foci of susceptibility artifact to suggest acute or chronic intracranial hemorrhage. No mass lesion, midline shift or mass effect. No hydrocephalus. No extra-axial fluid collection. Pituitary gland suprasellar region normal. Midline structures intact and normal. Vascular: Major intracranial vascular flow voids maintained. Skull and upper cervical spine: Craniocervical junction within normal limits. Upper cervical spine normal. Bone marrow signal intensity within normal limits. No scalp soft tissue abnormality. Sinuses/Orbits: Globes and orbital soft tissues within normal limits. Mild scattered mucosal thickening within the ethmoidal air cells. Paranasal sinuses are otherwise clear. No mastoid effusion. Inner ear structures normal. Other: None. IMPRESSION: 1. 17 mm acute ischemic lacunar type infarct involving the right subinsular white matter, extending into the right basal ganglia. No associated hemorrhage. 2. Underlying age-appropriate cerebral atrophy with mild chronic small vessel ischemic disease. Electronically Signed   By: Jeannine Boga M.D.   On: 11/28/2018 22:43   Ct  Head Code Stroke Wo Contrast  Result Date: 11/28/2018 CLINICAL DATA:  Code stroke. Left-sided weakness beginning 1400 hours today. EXAM: CT HEAD WITHOUT CONTRAST TECHNIQUE: Contiguous axial images were obtained from the base of the skull through the vertex without intravenous contrast. COMPARISON:  None. FINDINGS: Brain: Mild age related volume loss. No evidence of old or acute infarction, mass lesion, hemorrhage, hydrocephalus or extra-axial collection. Vascular: There is atherosclerotic calcification of the major vessels at the base of the brain. Skull: Negative Sinuses/Orbits: Clear/normal Other: None ASPECTS ([place] Stroke Program Early CT Score) - Ganglionic level infarction (caudate, lentiform nuclei, internal capsule, insula, M1-M3 cortex): 7 - Supraganglionic infarction (M4-M6 cortex): 3 Total score (0-10 with 10 being normal): 10 IMPRESSION: 1. Normal head CT for age. 2. ASPECTS is 10. 3. These results were called by telephone at the time of  interpretation on 11/28/2018 at 7:53 pm to Dr. Eula Listen , who verbally acknowledged these results. Electronically Signed   By: Nelson Chimes M.D.   On: 11/28/2018 19:54    Assessment: 73 y.o. male with pertinent past medical history of hyperlipidemia, and hypertension presenting to the ED on 11/28/2018 with a left sided weakness.  MRI of the brain reviewed and shows 17 mm acute ischemic lacunar type infarct involving the right subinsular white matter, extending into the right basal ganglia.  Etiology likely small vessel disease in the setting of elevated blood pressure on presentation.  CTA head and neck shows no large vessel occlusion, hemodynamically significant stenosis or aneurysm.  Hemoglobin A1c 5.5, LDL 62.  Patient report he was on aspirin 81 mg daily prior to this event.  Stroke Risk Factors - family history, hyperlipidemia and hypertension  Plan: 1. Start dual therapy Aspirin 81 mg/day and Plavix 75 mg /day for 21 days then can continue  with monotherapy.   2. Management of vascular risk factor to keep systolic BP (SBP) <735 mm Hg (130 mm Hg if diabetic)  3. Continue statin with goal low density lipoprotein (LDL) <70 mg/dl, and lifestyle modification.  4. PT consult, OT consult, Speech consult 5. Echocardiogram pending 6. Telemetry monitoring 7. Frequent neuro checks  This patient was staffed with Dr. Irish Elders, Alease Frame who personally evaluated patient, reviewed documentation and agreed with assessment and plan of care as above.  Rufina Falco, DNP, FNP-BC Board certified Nurse Practitioner Neurology Department  11/29/2018, 9:49 AM

## 2018-11-29 NOTE — Progress Notes (Signed)
   11/28/18 1932  Clinical Encounter Type  Visited With Patient;Health care provider  Visit Type Code  Referral From Nurse   Chaplain received a page for a code stroke. Upon arrival, the medical team was assessing the patient and waiting for the tele-neurology consultation. The patient was awake, alert and able to talk with staff. Chaplain maintained pastoral presence and offered silent prayer. Once the assessment was complete, the chaplain introduced herself to the patient. He expressed gratitude for checking in on him, but had no expressed needs at that time.

## 2018-11-29 NOTE — Evaluation (Signed)
Physical Therapy Evaluation Patient Details Name: Alfred Edwards MRN: [id number] DOB: 01/22/1946 Today's Date: 11/29/2018   History of Present Illness  73 y/o male here with L sided weakness/coordination issues, via imaging found to have had CVA.  Clinical Impression  Pt was able to be up and functional w/o assist for basic in-home type activities.  He showed good strength, balance and confidence with standing/ambulation tasks, though he did have a few very mild bouts of L knee buckling that he easily self arrested.  His biggest issue appears to be L UE coordination with mild strength deficits as compared to L, some education provided and he is awaiting OT work up.  He should be able to return home and manage independently w/o PT follow up but we will maintain on PT list while in hospital  for further education and training should be not d/c in the next 24 hours.    Follow Up Recommendations No PT follow up    Equipment Recommendations  None recommended by PT    Recommendations for Other Services       Precautions / Restrictions Precautions Precautions: None Restrictions Weight Bearing Restrictions: No      Mobility  Bed Mobility Overal bed mobility: Independent             General bed mobility comments: easily gets to sitting EOB w/o assist, returns to supine w/o issue  Transfers Overall transfer level: Independent Equipment used: None             General transfer comment: Pt able to rise and maintain balance w/o issue  Ambulation/Gait Ambulation/Gait assistance: Independent Gait Distance (Feet): 250 Feet Assistive device: None       General Gait Details: Pt was able to maintain consistent speed and cadence though he did have a few episodes with mild L knee buckling w/o LOBs or safety issues.  He had only mild c/o fatigue with the effort.  Stairs Stairs: Yes Stairs assistance: Modified independent (Device/Increase time) Stair Management: One rail  Right Number of Stairs: 6 General stair comments: Pt did catch L toes on one step that he was easily able to self correct but was likely related to acute L LE coordination issues  Wheelchair Mobility    Modified Rankin (Stroke Patients Only)       Balance Overall balance assessment: Modified Independent                                           Pertinent Vitals/Pain Pain Assessment: No/denies pain    Home Living Family/patient expects to be discharged to:: Private residence Living Arrangements: Alone     Home Access: Level entry     Home Layout: One level        Prior Function Level of Independence: Independent         Comments: Pt golfs 3x/week, independent with all ADLs, errands, etc     Hand Dominance   Dominant Hand: Right    Extremity/Trunk Assessment   Upper Extremity Assessment Upper Extremity Assessment: Overall WFL for tasks assessed;Defer to OT evaluation(L grossly 4-/5 with decreased coordination)    Lower Extremity Assessment Lower Extremity Assessment: Overall WFL for tasks assessed(L strength nearly = to R, mild coordination deficit)       Communication   Communication: No difficulties  Cognition Arousal/Alertness: Awake/alert Behavior During Therapy: WFL for tasks assessed/performed Overall Cognitive Status: Within  Functional Limits for tasks assessed                                        General Comments      Exercises     Assessment/Plan    PT Assessment Patent does not need any further PT services  PT Problem List         PT Treatment Interventions      PT Goals (Current goals can be found in the Care Plan section)  Acute Rehab PT Goals Patient Stated Goal: go home ASAP PT Goal Formulation: With patient Time For Goal Achievement: 12/13/18 Potential to Achieve Goals: Good    Frequency     Barriers to discharge        Co-evaluation               AM-PAC PT "6 Clicks"  Mobility  Outcome Measure Help needed turning from your back to your side while in a flat bed without using bedrails?: None Help needed moving from lying on your back to sitting on the side of a flat bed without using bedrails?: None Help needed moving to and from a bed to a chair (including a wheelchair)?: None Help needed standing up from a chair using your arms (e.g., wheelchair or bedside chair)?: None Help needed to walk in hospital room?: None Help needed climbing 3-5 steps with a railing? : None 6 Click Score: 24    End of Session Equipment Utilized During Treatment: Gait belt Activity Tolerance: Patient tolerated treatment well Patient left: in bed;with call bell/phone within reach   PT Visit Diagnosis: Other symptoms and signs involving the nervous system (R29.898);Hemiplegia and hemiparesis Hemiplegia - Right/Left: Left Hemiplegia - dominant/non-dominant: Non-dominant Hemiplegia - caused by: Cerebral infarction    Time: 7673-4193 PT Time Calculation (min) (ACUTE ONLY): 17 min   Charges:   PT Evaluation $PT Eval Low Complexity: 1 Low          Kreg Shropshire, DPT 11/29/2018, 9:45 AM

## 2018-11-29 NOTE — Evaluation (Signed)
Occupational Therapy Evaluation Patient Details Name: Alfred Edwards MRN: [id number] DOB: May 27, 1946 Today's Date: 11/29/2018    History of Present Illness 73 y/o male here with L sided weakness/coordination issues, via imaging found to have had CVA.   Clinical Impression   Pt seen for OT evaluation this date. Prior to hospital admission, pt was independent in all aspects of ADL/IADL, plays guitar, and golfs 3x/wk.  Pt lives alone in a level entry home. Currently pt near baseline, but does demonstrates mild impairments in L sided strength and coordination impairing his ability to perform higher level [facility] tasks at baseline independence.  Pt ambulating independently in his room upon OT's arrival. Pt denies visual, cognitive, and sensory deficits. Pt instructed in [facility] handout for LUE to address the strength/FMC impairments. Pt verbalizes understanding. No additional skilled OT needs at this time. Will sign off. No OT follow up recommended.    Follow Up Recommendations  No OT follow up    Equipment Recommendations  None recommended by OT    Recommendations for Other Services       Precautions / Restrictions Precautions Precautions: None Restrictions Weight Bearing Restrictions: No      Mobility Bed Mobility Overal bed mobility: Independent             General bed mobility comments: easily gets to sitting EOB w/o assist, returns to supine w/o issue  Transfers Overall transfer level: Independent Equipment used: None             General transfer comment: pt ambulating around room without difficulty upon OT's arrival    Balance Overall balance assessment: Modified Independent                                         ADL either performed or assessed with clinical judgement   ADL Overall ADL's : Independent                                       General ADL Comments:  no difficulty with ADL tasks, pt able to utilize LUE generally  without issues, pt just reports needing to "work a little harder" for L hand to work     Vision Baseline Vision/History: Wears glasses Wears Glasses: Reading only Patient Visual Report: No change from baseline       Perception     Praxis      Pertinent Vitals/Pain Pain Assessment: No/denies pain     Hand Dominance Right   Extremity/Trunk Assessment Upper Extremity Assessment Upper Extremity Assessment: LUE deficits/detail LUE Deficits / Details: L grossly 4/5, grip WFL bilat, and L hand with mildly decreased coordination with RAM testing, denies sensory deficits LUE Sensation: WNL LUE Coordination: decreased fine motor   Lower Extremity Assessment Lower Extremity Assessment: Overall WFL for tasks assessed(L strength nearly = to R, mild coordination deficit)   Cervical / Trunk Assessment Cervical / Trunk Assessment: Normal   Communication Communication Communication: No difficulties   Cognition Arousal/Alertness: Awake/alert Behavior During Therapy: WFL for tasks assessed/performed Overall Cognitive Status: Within Functional Limits for tasks assessed                                     General Comments  Exercises Other Exercises Other Exercises: pt instructed in [facility] handout to address mild [place] impairments in L hand- pt verbalized understanding   Shoulder Instructions      Home Living Family/patient expects to be discharged to:: Private residence Living Arrangements: Alone     Home Access: Level entry     Home Layout: One level         Bathroom Toilet: Standard                Prior Functioning/Environment Level of Independence: Independent        Comments: Pt golfs 3x/week, plays guitard, independent with all ADLs, errands, etc        OT Problem List: Decreased strength;Decreased coordination;Impaired UE functional use      OT Treatment/Interventions:      OT Goals(Current goals can be found in the care plan  section) Acute Rehab OT Goals Patient Stated Goal: go home ASAP OT Goal Formulation: All assessment and education complete, DC therapy  OT Frequency:     Barriers to D/C:            Co-evaluation              AM-PAC OT "6 Clicks" Daily Activity     Outcome Measure Help from another person eating meals?: None Help from another person taking care of personal grooming?: None Help from another person toileting, which includes using toliet, bedpan, or urinal?: None Help from another person bathing (including washing, rinsing, drying)?: None Help from another person to put on and taking off regular upper body clothing?: None Help from another person to put on and taking off regular lower body clothing?: None 6 Click Score: 24   End of Session    Activity Tolerance: Patient tolerated treatment well Patient left: in chair;with call bell/phone within reach  OT Visit Diagnosis: Hemiplegia and hemiparesis Hemiplegia - Right/Left: Left Hemiplegia - dominant/non-dominant: Non-Dominant Hemiplegia - caused by: Cerebral infarction                Time: 3244-0102 OT Time Calculation (min): 8 min Charges:  OT General Charges $OT Visit: 1 Visit OT Evaluation $OT Eval Low Complexity: 1 Low  Jeni Salles, MPH, MS, OTR/L ascom [phone] 11/29/18, 10:12 AM

## 2018-11-29 NOTE — Progress Notes (Signed)
*  PRELIMINARY RESULTS* Echocardiogram 2D Echocardiogram has been performed.  Alfred Edwards 11/29/2018, 12:00 PM

## 2018-11-29 NOTE — Progress Notes (Signed)
SLP Cancellation Note  Patient Details Name: Alfred Edwards MRN: [id number] DOB: [birth year]   Cancelled treatment:       Reason Eval/Treat Not Completed: SLP screened, no needs identified, will sign off, Chart reviewed. Nsg consulted. No apparent evidence of receptive/expressive aphasia, dysarthria, or cognitive linguistic deficits throughout ST screen at bedside. Pt denies any s/s aspiration or dysphagia with current Regular diet with thin liquids. No current needs identified. SLP to sign off at this time, please re-consult with any future change in status requiring re-assessment. Thank you for this consult.   Auriana Scalia, MA CCC-SLP 11/29/2018, 9:45 AM

## 2018-11-30 ENCOUNTER — Telehealth: Payer: Self-pay

## 2018-11-30 LAB — HIV ANTIBODY (ROUTINE TESTING W REFLEX): HIV Screen 4th Generation wRfx: NONREACTIVE

## 2018-11-30 NOTE — Telephone Encounter (Signed)
Transition Care Management Follow-up Telephone Call  Date of discharge and from where: [facility] on 11/29/18.  How have you been since you were released from the hospital? Doing ok, still has weakness in his left hand. Weakness in is legs is better. Declines SOB, pain, fever, tingling in extremities or n/v/d.   Any questions or concerns? No   Items Reviewed:  Did the pt receive and understand the discharge instructions provided? Yes   Medications obtained and verified? Pt did not want to verify all medications. Pt did confirm new rx (Plavix).  Any new allergies since your discharge? No   Dietary orders reviewed? Yes  Do you have support at home? Yes   Other (ie: DME, Home Health, etc) N/A  Functional Questionnaire: (I = Independent and D = Dependent)  Bathing/Dressing- I   Meal Prep- I  Eating- I  Maintaining continence- I  Transferring/Ambulation- I  Managing Meds- I   Follow up appointments reviewed:    PCP Hospital f/u appt confirmed? Yes  Scheduled to see Dr. Ronnald Ramp on 12/07/18 @ 3:00 PM.  [facility] f/u appt confirmed? N/A   Are transportation arrangements needed? No   If their condition worsens, is the pt aware to call  their PCP or go to the ED? Yes  Was the patient provided with contact information for the PCP's office or ED? Yes  Was the pt encouraged to call back with questions or concerns? Yes

## 2018-11-30 NOTE — Telephone Encounter (Signed)
HFU scheduled for 12/07/18.

## 2018-12-07 ENCOUNTER — Inpatient Hospital Stay: Payer: Self-pay | Admitting: Family Medicine

## 2018-12-07 ENCOUNTER — Encounter: Payer: Self-pay | Admitting: Family Medicine

## 2018-12-07 ENCOUNTER — Ambulatory Visit (INDEPENDENT_AMBULATORY_CARE_PROVIDER_SITE_OTHER): Payer: Medicare Other | Admitting: Family Medicine

## 2018-12-07 ENCOUNTER — Other Ambulatory Visit: Payer: Self-pay

## 2018-12-07 VITALS — BP 134/70 | HR 64 | Ht 68.0 in | Wt 171.0 lb

## 2018-12-07 DIAGNOSIS — Z09 Encounter for follow-up examination after completed treatment for conditions other than malignant neoplasm: Secondary | ICD-10-CM | POA: Diagnosis not present

## 2018-12-07 DIAGNOSIS — I679 Cerebrovascular disease, unspecified: Secondary | ICD-10-CM | POA: Diagnosis not present

## 2018-12-07 DIAGNOSIS — K219 Gastro-esophageal reflux disease without esophagitis: Secondary | ICD-10-CM | POA: Diagnosis not present

## 2018-12-07 DIAGNOSIS — E7849 Other hyperlipidemia: Secondary | ICD-10-CM | POA: Diagnosis not present

## 2018-12-07 DIAGNOSIS — I10 Essential (primary) hypertension: Secondary | ICD-10-CM

## 2018-12-07 NOTE — Progress Notes (Signed)
Date:  12/07/2018   Name:  Alfred Edwards   DOB:  1946/09/09   MRN:  [id number]   Chief Complaint: Follow-up (d/c from hospital on 11/29/2018 Dx with stroke. Still has some L) sided weakness especially in the hand. Does not have a follow up with specialist. Sent home with exercises that he has been doing everyday. ) and Gastroesophageal Reflux (told not to take Omeprazole with plavix/ can't take ranitidine- needs something for acid reflux)  Pt was recently admitted to [facility] for CVA/presented with left sided weakness on 11/28/18 and was discharged on 11/29/18. Transition of care call placed on 11/30/18.  Patient's recent admission was reviewed in detail.  Medications on discharge were reviewed with patient and will continue 21 days of Plavix afterwards will continue with monotherapy aspirin therapy.  Reviewed lab work with cholesterol well within normal range.  And also was shown results of him MRI and prognosis was discussed.   Review of Systems  Constitutional: Negative for chills and fever.  HENT: Negative for drooling, ear discharge, ear pain and sore throat.   Respiratory: Negative for cough, shortness of breath and wheezing.   Cardiovascular: Negative for chest pain, palpitations and leg swelling.  Gastrointestinal: Negative for abdominal pain, blood in stool, constipation, diarrhea and nausea.  Endocrine: Negative for polydipsia.  Genitourinary: Negative for dysuria, frequency, hematuria and urgency.  Musculoskeletal: Negative for back pain, myalgias and neck pain.  Skin: Negative for rash.  Allergic/Immunologic: Negative for environmental allergies.  Neurological: Negative for dizziness and headaches.  Hematological: Does not bruise/bleed easily.  Psychiatric/Behavioral: Negative for suicidal ideas. The patient is not nervous/anxious.     Patient Active Problem List   Diagnosis Date Noted   CVA (cerebral vascular accident) ([place]) 11/29/2018   TIA (transient ischemic  attack) 11/28/2018   Elevated PSA 08/28/2017   Erectile dysfunction 08/28/2017   Benign prostatic hyperplasia with lower urinary tract symptoms 08/28/2017   Venous insufficiency 02/02/2017   Gastroesophageal reflux disease without esophagitis 02/02/2017   Essential hypertension 12/28/2015   Hyperlipidemia 12/28/2015   Nocturia associated with benign prostatic hypertrophy 12/28/2015   Familial multiple lipoprotein-type hyperlipidemia 01/23/2015   Anxiety disorder due to known physiological condition 01/23/2015   Routine general medical examination at a health care facility 01/23/2015   Adjustment reaction 01/23/2015   Essential (primary) hypertension 01/23/2015   Proteinuria 06/01/2012    No Known Allergies  Past Surgical History:  Procedure Laterality Date   COLONOSCOPY  2005, 01/2015   Dr Vira Agar- cleared for 3 yrs   TONSILLECTOMY     UPPER GASTROINTESTINAL ENDOSCOPY  01/2015   Dr Vira Agar- erosions on esophagus    Social History   Tobacco Use   Smoking status: Former Smoker    Types: Cigarettes    Last attempt to quit: 09/13/1971    Years since quitting: 47.2   Smokeless tobacco: Never Used  Substance Use Topics   Alcohol use: Yes    Alcohol/week: 0.0 standard drinks    Comment: socially   Drug use: No     Medication list has been reviewed and updated.  Current Meds  Medication Sig   Ascorbic Acid (VITAMIN C) 500 MG CAPS Take 1 capsule by mouth 2 (two) times daily.    aspirin 81 MG tablet Take 1 tablet by mouth daily.   Cholecalciferol (VITAMIN D3) 2000 UNITS capsule Take 1 capsule by mouth daily.   clopidogrel (PLAVIX) 75 MG tablet Take 1 tablet (75 mg total) by mouth daily for 21  days.   Coenzyme Q10 (COQ-10) 100 MG CAPS Take 1 capsule by mouth daily.   dutasteride (AVODART) 0.5 MG capsule Take 1 capsule (0.5 mg total) by mouth daily. (Patient taking differently: Take 0.5 mg by mouth 3 (three) times a week. Monday, Wednesday and Friday)    lisinopril (PRINIVIL,ZESTRIL) 10 MG tablet Take 1 tablet (10 mg total) by mouth daily.   Multiple Vitamins-Minerals (CENTRUM SILVER ADULT 50+ PO) Take 1 tablet by mouth daily.   NIFEdipine (PROCARDIA-XL/NIFEDICAL-XL) 30 MG 24 hr tablet TAKE 1 TABLET (30 MG TOTAL) BY MOUTH DAILY.   Omega-3 Fatty Acids (FISH OIL) 1000 MG CAPS Take 1 capsule (1,000 mg total) by mouth 2 (two) times daily.   sildenafil (REVATIO) 20 MG tablet 1-5 tabs as needed 1 hour prior to intercourse   simvastatin (ZOCOR) 20 MG tablet Take 1 tablet (20 mg total) by mouth daily.   Zinc 25 MG TABS Take 25 mg by mouth daily.     PHQ 2/9 Scores 12/07/2018 10/10/2018 08/22/2018 02/21/2018  PHQ - 2 Score 0 0 0 0  PHQ- 9 Score 0 - 0 0    BP Readings from Last 3 Encounters:  12/07/18 134/70  11/29/18 (!) 144/73  10/10/18 132/76    Physical Exam Vitals signs and nursing note reviewed.  HENT:     Head: Normocephalic.     Jaw: There is normal jaw occlusion.     Right Ear: Hearing, tympanic membrane, ear canal and external ear normal.     Left Ear: Hearing, tympanic membrane, ear canal and external ear normal.     Nose: Nose normal. No congestion or rhinorrhea.     Mouth/Throat:     Lips: Pink.     Mouth: Mucous membranes are pale, dry and cyanotic.     Tongue: No lesions. Tongue does not deviate from midline.  Eyes:     General: No visual field deficit or scleral icterus.       Right eye: No discharge.        Left eye: No discharge.     Conjunctiva/sclera: Conjunctivae normal.     Pupils: Pupils are equal, round, and reactive to light.  Neck:     Musculoskeletal: Normal range of motion and neck supple.     Thyroid: No thyromegaly.     Vascular: No JVD.     Trachea: No tracheal deviation.  Cardiovascular:     Rate and Rhythm: Normal rate and regular rhythm.     Heart sounds: Normal heart sounds. No murmur. No friction rub. No gallop.   Pulmonary:     Effort: No respiratory distress.     Breath sounds: Normal  breath sounds. No wheezing or rales.  Abdominal:     General: Bowel sounds are normal.     Palpations: Abdomen is soft. There is no mass.     Tenderness: There is no abdominal tenderness. There is no guarding or rebound.  Musculoskeletal: Normal range of motion.        General: No tenderness.  Lymphadenopathy:     Cervical: No cervical adenopathy.  Skin:    General: Skin is warm.     Findings: No rash.  Neurological:     General: No focal deficit present.     Mental Status: He is alert and oriented to person, place, and time.     Cranial Nerves: No cranial nerve deficit, dysarthria or facial asymmetry.     Sensory: Sensation is intact. No sensory deficit.  Motor: Motor function is intact. No weakness.     Coordination: Coordination is intact.     Deep Tendon Reflexes: Reflexes are normal and symmetric.     Wt Readings from Last 3 Encounters:  12/07/18 171 lb (77.6 kg)  11/28/18 167 lb 6.4 oz (75.9 kg)  10/10/18 177 lb 3.2 oz (80.4 kg)    BP 134/70    Pulse 64    Ht 5\' 8"  (1.727 m)    Wt 171 lb (77.6 kg)    BMI 26.00 kg/m   Assessment and Plan: 1. Hospital discharge follow-up But will discharge was reviewed.  No subjective objective concerns noted with the history and physical today.  2. Essential (primary) hypertension Patient is to continue current therapy of lisinopril and nifedipine.  3. Cerebrovascular disease We will continue with the 21-day course of Plavix as well as continuing aspirin 81 mg daily.  After 21 days monotherapy will be continued  4. Gastroesophageal reflux disease without esophagitis Patient reviewed and will continue ranitidine she did not realize was different from the omeprazole.  5. Familial multiple lipoprotein-type hyperlipidemia Review of patient's lipids conducted with patient's LDL HDL and total cholesterol with cardiac risk was discussed.

## 2019-02-06 DIAGNOSIS — L570 Actinic keratosis: Secondary | ICD-10-CM | POA: Diagnosis not present

## 2019-02-06 DIAGNOSIS — Z872 Personal history of diseases of the skin and subcutaneous tissue: Secondary | ICD-10-CM | POA: Diagnosis not present

## 2019-02-06 DIAGNOSIS — Z86018 Personal history of other benign neoplasm: Secondary | ICD-10-CM | POA: Diagnosis not present

## 2019-02-06 DIAGNOSIS — L578 Other skin changes due to chronic exposure to nonionizing radiation: Secondary | ICD-10-CM | POA: Diagnosis not present

## 2019-02-20 ENCOUNTER — Ambulatory Visit (INDEPENDENT_AMBULATORY_CARE_PROVIDER_SITE_OTHER): Payer: Medicare Other | Admitting: Family Medicine

## 2019-02-20 ENCOUNTER — Encounter: Payer: Self-pay | Admitting: Family Medicine

## 2019-02-20 ENCOUNTER — Other Ambulatory Visit: Payer: Self-pay

## 2019-02-20 VITALS — BP 110/62 | HR 72 | Ht 68.0 in | Wt 175.0 lb

## 2019-02-20 DIAGNOSIS — I10 Essential (primary) hypertension: Secondary | ICD-10-CM

## 2019-02-20 DIAGNOSIS — E7849 Other hyperlipidemia: Secondary | ICD-10-CM | POA: Diagnosis not present

## 2019-02-20 DIAGNOSIS — I679 Cerebrovascular disease, unspecified: Secondary | ICD-10-CM | POA: Diagnosis not present

## 2019-02-20 MED ORDER — NIFEDIPINE ER OSMOTIC RELEASE 30 MG PO TB24: ORAL_TABLET | ORAL | 1 refills | Status: DC

## 2019-02-20 MED ORDER — ASPIRIN 81 MG PO TBEC: 81.0000 mg | DELAYED_RELEASE_TABLET | Freq: Every day | ORAL | 12 refills | Status: AC

## 2019-02-20 MED ORDER — FISH OIL 1000 MG PO CAPS: 1.0000 | ORAL_CAPSULE | Freq: Two times a day (BID) | ORAL | 11 refills | Status: DC

## 2019-02-20 MED ORDER — SIMVASTATIN 20 MG PO TABS: 20.0000 mg | ORAL_TABLET | Freq: Every day | ORAL | 1 refills | Status: DC

## 2019-02-20 MED ORDER — LISINOPRIL 10 MG PO TABS: 10.0000 mg | ORAL_TABLET | Freq: Every day | ORAL | 1 refills | Status: DC

## 2019-02-20 NOTE — Progress Notes (Signed)
Date:  02/20/2019   Name:  Alfred Edwards   DOB:  1946-04-02   MRN:  [id number]   Chief Complaint: Hypertension and Hyperlipidemia  Hypertension  This is a chronic problem. The current episode started more than 1 year ago. The problem has been gradually improving since onset. The problem is controlled. Pertinent negatives include no anxiety, blurred vision, chest pain, headaches, malaise/fatigue, neck pain, orthopnea, palpitations, peripheral edema, PND, shortness of breath or sweats. There are no associated agents to hypertension. There are no known risk factors for coronary artery disease. Past treatments include ACE inhibitors. The current treatment provides moderate improvement. There are no compliance problems.  There is no history of angina, kidney disease, CAD/MI, CVA, heart failure, left ventricular hypertrophy, PVD or retinopathy. There is no history of chronic renal disease, a hypertension causing med or renovascular disease.  Hyperlipidemia  This is a chronic problem. The current episode started more than 1 year ago. The problem is controlled. Recent lipid tests were reviewed and are normal. He has no history of chronic renal disease, diabetes, hypothyroidism, liver disease, obesity or nephrotic syndrome. There are no known factors aggravating his hyperlipidemia. Associated symptoms include a focal sensory loss and a focal weakness. Pertinent negatives include no chest pain, leg pain, myalgias or shortness of breath. Current antihyperlipidemic treatment includes statins (omega). The current treatment provides mild improvement of lipids. There are no compliance problems.  Risk factors for coronary artery disease include hypertension, male sex and post-menopausal.  Neurologic Problem  The patient's primary symptoms include focal sensory loss and focal weakness. The patient's pertinent negatives include no altered mental status or clumsiness. Primary symptoms comment: left sided. The  current episode started more than 1 month ago. The neurological problem developed suddenly (now stable). Progression since onset: some residual sx's. Pertinent negatives include no abdominal pain, back pain, chest pain, dizziness, fever, headaches, nausea, neck pain, palpitations or shortness of breath. Past treatments include aspirin (stent). The treatment provided moderate relief. There is no history of liver disease.    Review of Systems  Constitutional: Negative for chills, fever and malaise/fatigue.  HENT: Negative for drooling, ear discharge, ear pain and sore throat.   Eyes: Negative for blurred vision.  Respiratory: Negative for cough, shortness of breath and wheezing.   Cardiovascular: Negative for chest pain, palpitations, orthopnea, leg swelling and PND.  Gastrointestinal: Negative for abdominal pain, blood in stool, constipation, diarrhea and nausea.  Endocrine: Negative for polydipsia.  Genitourinary: Negative for dysuria, frequency, hematuria and urgency.  Musculoskeletal: Negative for back pain, myalgias and neck pain.  Skin: Negative for rash.  Allergic/Immunologic: Negative for environmental allergies.  Neurological: Positive for focal weakness. Negative for dizziness and headaches.  Hematological: Does not bruise/bleed easily.  Psychiatric/Behavioral: Negative for suicidal ideas. The patient is not nervous/anxious.     Patient Active Problem List   Diagnosis Date Noted  . CVA (cerebral vascular accident) ([place]) 11/29/2018  . TIA (transient ischemic attack) 11/28/2018  . Elevated PSA 08/28/2017  . Erectile dysfunction 08/28/2017  . Benign prostatic hyperplasia with lower urinary tract symptoms 08/28/2017  . Venous insufficiency 02/02/2017  . Gastroesophageal reflux disease without esophagitis 02/02/2017  . Essential hypertension 12/28/2015  . Hyperlipidemia 12/28/2015  . Nocturia associated with benign prostatic hypertrophy 12/28/2015  . Familial multiple  lipoprotein-type hyperlipidemia 01/23/2015  . Anxiety disorder due to known physiological condition 01/23/2015  . Routine general medical examination at a health care facility 01/23/2015  . Adjustment reaction 01/23/2015  . Essential (primary)  hypertension 01/23/2015  . Proteinuria 06/01/2012    No Known Allergies  Past Surgical History:  Procedure Laterality Date  . COLONOSCOPY  2005, 01/2015   Dr Vira Agar- cleared for 3 yrs  . TONSILLECTOMY    . UPPER GASTROINTESTINAL ENDOSCOPY  01/2015   Dr Vira Agar- erosions on esophagus    Social History   Tobacco Use  . Smoking status: Former Smoker    Types: Cigarettes    Last attempt to quit: 09/13/1971    Years since quitting: 47.4  . Smokeless tobacco: Never Used  Substance Use Topics  . Alcohol use: Yes    Alcohol/week: 0.0 standard drinks    Comment: socially  . Drug use: No     Medication list has been reviewed and updated.  Current Meds  Medication Sig  . Ascorbic Acid (VITAMIN C) 500 MG CAPS Take 1 capsule by mouth 2 (two) times daily.   Marland Kitchen aspirin 81 MG tablet Take 1 tablet by mouth daily.  . Cholecalciferol (VITAMIN D3) 2000 UNITS capsule Take 1 capsule by mouth daily.  . Coenzyme Q10 (COQ-10) 100 MG CAPS Take 1 capsule by mouth daily.  Marland Kitchen dutasteride (AVODART) 0.5 MG capsule Take 1 capsule (0.5 mg total) by mouth daily. (Patient taking differently: Take 0.5 mg by mouth 3 (three) times a week. Monday, Wednesday and Friday)  . lisinopril (PRINIVIL,ZESTRIL) 10 MG tablet Take 1 tablet (10 mg total) by mouth daily.  . Multiple Vitamins-Minerals (CENTRUM SILVER ADULT 50+ PO) Take 1 tablet by mouth daily.  Marland Kitchen NIFEdipine (PROCARDIA-XL/NIFEDICAL-XL) 30 MG 24 hr tablet TAKE 1 TABLET (30 MG TOTAL) BY MOUTH DAILY.  Marland Kitchen Omega-3 Fatty Acids (FISH OIL) 1000 MG CAPS Take 1 capsule (1,000 mg total) by mouth 2 (two) times daily.  . sildenafil (REVATIO) 20 MG tablet 1-5 tabs as needed 1 hour prior to intercourse  . simvastatin (ZOCOR) 20 MG  tablet Take 1 tablet (20 mg total) by mouth daily.  . Zinc 25 MG TABS Take 25 mg by mouth daily.     PHQ 2/9 Scores 02/20/2019 12/07/2018 10/10/2018 08/22/2018  PHQ - 2 Score 0 0 0 0  PHQ- 9 Score 0 0 - 0    BP Readings from Last 3 Encounters:  02/20/19 110/62  12/07/18 134/70  11/29/18 (!) 144/73    Physical Exam Vitals signs and nursing note reviewed.  HENT:     Head: Normocephalic.     Right Ear: Tympanic membrane and external ear normal.     Left Ear: Tympanic membrane and external ear normal.     Nose: Nose normal.  Eyes:     General: No scleral icterus.       Right eye: No discharge.        Left eye: No discharge.     Conjunctiva/sclera: Conjunctivae normal.     Pupils: Pupils are equal, round, and reactive to light.  Neck:     Musculoskeletal: Normal range of motion and neck supple.     Thyroid: No thyromegaly.     Vascular: No JVD.     Trachea: No tracheal deviation.  Cardiovascular:     Rate and Rhythm: Normal rate and regular rhythm.     Chest Wall: PMI is not displaced. No thrill.     Heart sounds: Normal heart sounds and S1 normal. No murmur. No systolic murmur. No diastolic murmur. No friction rub. No gallop. No S3 or S4 sounds.   Pulmonary:     Effort: No respiratory distress.  Breath sounds: Normal breath sounds. No decreased breath sounds, wheezing, rhonchi or rales.  Abdominal:     General: Bowel sounds are normal.     Palpations: Abdomen is soft. There is no mass.     Tenderness: There is no abdominal tenderness. There is no guarding or rebound.  Musculoskeletal: Normal range of motion.        General: No tenderness.  Lymphadenopathy:     Cervical: No cervical adenopathy.  Skin:    General: Skin is warm.     Findings: No rash.  Neurological:     Mental Status: He is alert and oriented to person, place, and time.     Cranial Nerves: No cranial nerve deficit.     Deep Tendon Reflexes: Reflexes are normal and symmetric.     Wt Readings from  Last 3 Encounters:  02/20/19 175 lb (79.4 kg)  12/07/18 171 lb (77.6 kg)  11/28/18 167 lb 6.4 oz (75.9 kg)    BP 110/62   Pulse 72   Ht 5\' 8"  (1.727 m)   Wt 175 lb (79.4 kg)   BMI 26.61 kg/m   Assessment and Plan: 1. Essential (primary) hypertension .  Controlled.  Continue lisinopril 10 mg once a day and nifedipine 30 mg once a day.  Will check renal function panel.  Will recheck in 6 months - Renal Function Panel - lisinopril (ZESTRIL) 10 MG tablet; Take 1 tablet (10 mg total) by mouth daily.  Dispense: 90 tablet; Refill: 1 - NIFEdipine (PROCARDIA-XL/NIFEDICAL-XL) 30 MG 24 hr tablet; TAKE 1 TABLET (30 MG TOTAL) BY MOUTH DAILY.  Dispense: 90 tablet; Refill: 1  2. Familial multiple lipoprotein-type hyperlipidemia Chronic.  Controlled.  Continue simvastatin 20 mg once a day and omega-3 fatty acids twice a day.  Lipid panel for evaluation of lipid status.  Patient has by history good HDL range - Lipid panel - simvastatin (ZOCOR) 20 MG tablet; Take 1 tablet (20 mg total) by mouth daily.  Dispense: 90 tablet; Refill: 1 - Omega-3 Fatty Acids (FISH OIL) 1000 MG CAPS; Take 1 capsule (1,000 mg total) by mouth 2 (two) times daily.  Dispense: 30 capsule; Refill: 11  3. Cerebral vascular disease New onset patient presented to hospital with TIA MRI and CT scan showed no acute bleed patient had stroke protocol but there is some suggestion that he had a stroke rather than a TIA because there is some mild residual weakness.  After 21-day Plavix patient continued only the 81 mg aspirin and patient was encouraged to do so. - aspirin (ASPIRIN LOW DOSE) 81 MG EC tablet; Take 1 tablet (81 mg total) by mouth daily. Swallow whole.  Dispense: 30 tablet; Refill: 12

## 2019-02-21 LAB — LIPID PANEL
Chol/HDL Ratio: 2 ratio (ref 0.0–5.0)
Cholesterol, Total: 143 mg/dL (ref 100–199)
HDL: 73 mg/dL (ref >39)
LDL Calculated: 62 mg/dL (ref 0–99)
Triglycerides: 39 mg/dL (ref 0–149)
VLDL Cholesterol Cal: 8 mg/dL (ref 5–40)

## 2019-02-21 LAB — RENAL FUNCTION PANEL
Albumin: 4.1 g/dL (ref 3.8–4.8)
BUN/Creatinine Ratio: 17 (ref 10–24)
BUN: 16 mg/dL (ref 8–27)
CO2: 20 mmol/L (ref 20–29)
Calcium: 8.9 mg/dL (ref 8.6–10.2)
Chloride: 109 mmol/L — ABNORMAL HIGH (ref 96–106)
Creatinine, Ser: 0.94 mg/dL (ref 0.76–1.27)
GFR calc Af Amer: 95 mL/min/{1.73_m2} (ref >59)
GFR calc non Af Amer: 82 mL/min/{1.73_m2} (ref >59)
Glucose: 96 mg/dL (ref 65–99)
Phosphorus: 3.2 mg/dL (ref 2.8–4.1)
Potassium: 5.3 mmol/L — ABNORMAL HIGH (ref 3.5–5.2)
Sodium: 145 mmol/L — ABNORMAL HIGH (ref 134–144)

## 2019-08-21 ENCOUNTER — Ambulatory Visit (INDEPENDENT_AMBULATORY_CARE_PROVIDER_SITE_OTHER): Payer: Medicare Other | Admitting: Family Medicine

## 2019-08-21 ENCOUNTER — Encounter: Payer: Self-pay | Admitting: Family Medicine

## 2019-08-21 ENCOUNTER — Other Ambulatory Visit: Payer: Self-pay

## 2019-08-21 VITALS — BP 138/70 | HR 80 | Ht 68.0 in | Wt 173.0 lb

## 2019-08-21 DIAGNOSIS — I679 Cerebrovascular disease, unspecified: Secondary | ICD-10-CM | POA: Diagnosis not present

## 2019-08-21 DIAGNOSIS — E7849 Other hyperlipidemia: Secondary | ICD-10-CM | POA: Diagnosis not present

## 2019-08-21 DIAGNOSIS — I10 Essential (primary) hypertension: Secondary | ICD-10-CM

## 2019-08-21 MED ORDER — FISH OIL 1000 MG PO CAPS: 1.0000 | ORAL_CAPSULE | Freq: Two times a day (BID) | ORAL | 11 refills | Status: AC

## 2019-08-21 MED ORDER — SIMVASTATIN 20 MG PO TABS: 20.0000 mg | ORAL_TABLET | Freq: Every day | ORAL | 1 refills | Status: DC

## 2019-08-21 MED ORDER — NIFEDIPINE ER OSMOTIC RELEASE 30 MG PO TB24: ORAL_TABLET | ORAL | 1 refills | Status: DC

## 2019-08-21 MED ORDER — LISINOPRIL 10 MG PO TABS: 10.0000 mg | ORAL_TABLET | Freq: Every day | ORAL | 1 refills | Status: DC

## 2019-08-21 NOTE — Progress Notes (Signed)
Date:  08/21/2019   Name:  Alfred Edwards   DOB:  25-Sep-1945   MRN:  [id number]   Chief Complaint: Hypertension and Hyperlipidemia  Hypertension This is a chronic problem. The current episode started more than 1 year ago. The problem has been gradually improving since onset. The problem is controlled. Pertinent negatives include no anxiety, blurred vision, chest pain, headaches, malaise/fatigue, neck pain, orthopnea, palpitations, peripheral edema, PND, shortness of breath or sweats. There are no associated agents to hypertension. Risk factors for coronary artery disease include dyslipidemia. Past treatments include calcium channel blockers and ACE inhibitors. The current treatment provides moderate improvement. There are no compliance problems.  There is no history of angina, kidney disease, CAD/MI, CVA, heart failure, left ventricular hypertrophy, PVD or retinopathy. There is no history of chronic renal disease, a hypertension causing med or renovascular disease.  Hyperlipidemia This is a chronic problem. The current episode started more than 1 year ago. The problem is controlled. Recent lipid tests were reviewed and are normal. He has no history of chronic renal disease, diabetes, hypothyroidism, liver disease, obesity or nephrotic syndrome. Pertinent negatives include no chest pain, focal sensory loss, focal weakness, leg pain, myalgias or shortness of breath. Current antihyperlipidemic treatment includes statins (omega 3). The current treatment provides moderate improvement of lipids. There are no compliance problems.  Risk factors for coronary artery disease include hypertension, male sex and dyslipidemia.  Neurologic Problem The patient's pertinent negatives include no altered mental status, clumsiness, focal sensory loss, focal weakness, loss of balance, memory loss, near-syncope, slurred speech, syncope, visual change or weakness. Primary symptoms comment: for cerebrovascular disease. This  is a chronic problem. The current episode started more than 1 year ago. The neurological problem developed gradually. The problem has been gradually improving since onset. There was left-sided (paresthesia/weakness) focality noted. Pertinent negatives include no abdominal pain, back pain, chest pain, dizziness, fever, headaches, nausea, neck pain, palpitations or shortness of breath. Past treatments include aspirin. The treatment provided moderate relief. His past medical history is significant for a CVA. There is no history of liver disease.    Lab Results  Component Value Date   CREATININE 0.94 02/20/2019   BUN 16 02/20/2019   NA 145 (H) 02/20/2019   K 5.3 (H) 02/20/2019   CL 109 (H) 02/20/2019   CO2 20 02/20/2019   Lab Results  Component Value Date   CHOL 143 02/20/2019   HDL 73 02/20/2019   LDLCALC 62 02/20/2019   TRIG 39 02/20/2019   CHOLHDL 2.0 02/20/2019   No results found for: TSH Lab Results  Component Value Date   HGBA1C 5.5 11/28/2018     Review of Systems  Constitutional: Negative for chills, fever and malaise/fatigue.  HENT: Negative for drooling, ear discharge, ear pain and sore throat.   Eyes: Negative for blurred vision.  Respiratory: Negative for cough, shortness of breath and wheezing.   Cardiovascular: Negative for chest pain, palpitations, orthopnea, leg swelling, PND and near-syncope.  Gastrointestinal: Negative for abdominal pain, blood in stool, constipation, diarrhea and nausea.  Endocrine: Negative for polydipsia.  Genitourinary: Negative for dysuria, frequency, hematuria and urgency.  Musculoskeletal: Negative for back pain, myalgias and neck pain.  Skin: Negative for rash.  Allergic/Immunologic: Negative for environmental allergies.  Neurological: Negative for dizziness, focal weakness, syncope, weakness, headaches and loss of balance.  Hematological: Does not bruise/bleed easily.  Psychiatric/Behavioral: Negative for memory loss and suicidal ideas.  The patient is not nervous/anxious.     Patient  Active Problem List   Diagnosis Date Noted  . CVA (cerebral vascular accident) ([place]) 11/29/2018  . TIA (transient ischemic attack) 11/28/2018  . Elevated PSA 08/28/2017  . Erectile dysfunction 08/28/2017  . Benign prostatic hyperplasia with lower urinary tract symptoms 08/28/2017  . Venous insufficiency 02/02/2017  . Gastroesophageal reflux disease without esophagitis 02/02/2017  . Essential hypertension 12/28/2015  . Hyperlipidemia 12/28/2015  . Nocturia associated with benign prostatic hypertrophy 12/28/2015  . Familial multiple lipoprotein-type hyperlipidemia 01/23/2015  . Anxiety disorder due to known physiological condition 01/23/2015  . Routine general medical examination at a health care facility 01/23/2015  . Adjustment reaction 01/23/2015  . Essential (primary) hypertension 01/23/2015  . Proteinuria 06/01/2012    No Known Allergies  Past Surgical History:  Procedure Laterality Date  . COLONOSCOPY  2005, 01/2015   Dr Vira Agar- cleared for 3 yrs  . TONSILLECTOMY    . UPPER GASTROINTESTINAL ENDOSCOPY  01/2015   Dr Vira Agar- erosions on esophagus    Social History   Tobacco Use  . Smoking status: Former Smoker    Types: Cigarettes    Quit date: 09/13/1971    Years since quitting: 47.9  . Smokeless tobacco: Never Used  Substance Use Topics  . Alcohol use: Yes    Alcohol/week: 0.0 standard drinks    Comment: socially  . Drug use: No     Medication list has been reviewed and updated.  Current Meds  Medication Sig  . Ascorbic Acid (VITAMIN C) 500 MG CAPS Take 1 capsule by mouth 2 (two) times daily.   Marland Kitchen aspirin (ASPIRIN LOW DOSE) 81 MG EC tablet Take 1 tablet (81 mg total) by mouth daily. Swallow whole.  . Cholecalciferol (VITAMIN D3) 2000 UNITS capsule Take 1 capsule by mouth daily.  . Coenzyme Q10 (COQ-10) 100 MG CAPS Take 1 capsule by mouth daily.  Marland Kitchen dutasteride (AVODART) 0.5 MG capsule Take 1 capsule (0.5 mg  total) by mouth daily. (Patient taking differently: Take 0.5 mg by mouth 3 (three) times a week. Monday, Wednesday and Friday)  . lisinopril (ZESTRIL) 10 MG tablet Take 1 tablet (10 mg total) by mouth daily.  . Multiple Vitamins-Minerals (CENTRUM SILVER ADULT 50+ PO) Take 1 tablet by mouth daily.  Marland Kitchen NIFEdipine (PROCARDIA-XL/NIFEDICAL-XL) 30 MG 24 hr tablet TAKE 1 TABLET (30 MG TOTAL) BY MOUTH DAILY.  Marland Kitchen Omega-3 Fatty Acids (FISH OIL) 1000 MG CAPS Take 1 capsule (1,000 mg total) by mouth 2 (two) times daily.  . sildenafil (REVATIO) 20 MG tablet 1-5 tabs as needed 1 hour prior to intercourse  . simvastatin (ZOCOR) 20 MG tablet Take 1 tablet (20 mg total) by mouth daily.  . Zinc 25 MG TABS Take 25 mg by mouth daily.     PHQ 2/9 Scores 08/21/2019 02/20/2019 12/07/2018 10/10/2018  PHQ - 2 Score 0 0 0 0  PHQ- 9 Score 0 0 0 -    BP Readings from Last 3 Encounters:  08/21/19 138/70  02/20/19 110/62  12/07/18 134/70    Physical Exam Vitals signs and nursing note reviewed.  HENT:     Head: Normocephalic.     Right Ear: Tympanic membrane, ear canal and external ear normal.     Left Ear: Tympanic membrane, ear canal and external ear normal.     Nose: Nose normal.     Mouth/Throat:     Mouth: Mucous membranes are moist.  Eyes:     General: No scleral icterus.       Right eye: No discharge.  Left eye: No discharge.     Conjunctiva/sclera: Conjunctivae normal.     Pupils: Pupils are equal, round, and reactive to light.  Neck:     Musculoskeletal: Normal range of motion and neck supple.     Thyroid: No thyromegaly.     Vascular: No JVD.     Trachea: No tracheal deviation.  Cardiovascular:     Rate and Rhythm: Normal rate and regular rhythm.     Heart sounds: Normal heart sounds. No murmur. No friction rub. No gallop.   Pulmonary:     Effort: No respiratory distress.     Breath sounds: Normal breath sounds. No wheezing, rhonchi or rales.  Abdominal:     General: Bowel sounds are  normal.     Palpations: Abdomen is soft. There is no mass.     Tenderness: There is no abdominal tenderness. There is no guarding or rebound.  Musculoskeletal: Normal range of motion.        General: No tenderness.  Lymphadenopathy:     Cervical: No cervical adenopathy.  Skin:    General: Skin is warm.     Findings: No rash.  Neurological:     Mental Status: He is alert and oriented to person, place, and time.     Cranial Nerves: Cranial nerves are intact. No cranial nerve deficit.     Sensory: Sensation is intact.     Motor: Motor function is intact.     Deep Tendon Reflexes: Reflexes are normal and symmetric.     Wt Readings from Last 3 Encounters:  08/21/19 173 lb (78.5 kg)  02/20/19 175 lb (79.4 kg)  12/07/18 171 lb (77.6 kg)    BP 138/70   Pulse 80   Ht 5\' 8"  (1.727 m)   Wt 173 lb (78.5 kg)   BMI 26.30 kg/m   Assessment and Plan:   1. Essential (primary) hypertension Chronic.  Controlled.  Stable.  Blood pressure reading is at 0000000 systolic over 70.  Will continue lisinopril 10 mg once a day and nifedipine XL 30 mg once a day.  Patient will continue to have a low sodium intake however we may have to make a change on his low salt since his potassium was elevated last visit.  We will check a renal function panel to see what his potassium is and adjust his dietary intake accordingly. - lisinopril (ZESTRIL) 10 MG tablet; Take 1 tablet (10 mg total) by mouth daily.  Dispense: 90 tablet; Refill: 1 - NIFEdipine (PROCARDIA-XL/NIFEDICAL-XL) 30 MG 24 hr tablet; TAKE 1 TABLET (30 MG TOTAL) BY MOUTH DAILY.  Dispense: 90 tablet; Refill: 1 - Renal Function Panel  2. Familial multiple lipoprotein-type hyperlipidemia Chronic.  Controlled.  Stable.  Will continue omega-3 over-the-counter and Zocor 20 mg once a day.  Will check lipid panel today. - Omega-3 Fatty Acids (FISH OIL) 1000 MG CAPS; Take 1 capsule (1,000 mg total) by mouth 2 (two) times daily.  Dispense: 30 capsule; Refill: 11  - simvastatin (ZOCOR) 20 MG tablet; Take 1 tablet (20 mg total) by mouth daily.  Dispense: 90 tablet; Refill: 1 - Lipid panel  3. Cerebrovascular disease Patient within the last 6 months had a CVA which required thrombolytic therapy.  This presented as a left-sided weakness and sensory deficit.  This is fully resolved and patient is doing well on current regimen of controlling lipids, blood pressure, and aspirin low-dose 81 mg daily.  Addendum patient has a history of GERD and gastritis which has resolved.  On follow-up with his GI his PPI was discontinued and was suggested to go on Zantac patient has not needed this as well and would prefer not to take anything in I would concur with this decision.

## 2019-08-22 LAB — LIPID PANEL
Chol/HDL Ratio: 2.1 ratio (ref 0.0–5.0)
Cholesterol, Total: 155 mg/dL (ref 100–199)
HDL: 74 mg/dL (ref >39)
LDL Chol Calc (NIH): 65 mg/dL (ref 0–99)
Triglycerides: 83 mg/dL (ref 0–149)
VLDL Cholesterol Cal: 16 mg/dL (ref 5–40)

## 2019-08-22 LAB — RENAL FUNCTION PANEL
Albumin: 4.2 g/dL (ref 3.7–4.7)
BUN/Creatinine Ratio: 17 (ref 10–24)
BUN: 17 mg/dL (ref 8–27)
CO2: 21 mmol/L (ref 20–29)
Calcium: 9.3 mg/dL (ref 8.6–10.2)
Chloride: 104 mmol/L (ref 96–106)
Creatinine, Ser: 1.02 mg/dL (ref 0.76–1.27)
GFR calc Af Amer: 85 mL/min/{1.73_m2} (ref >59)
GFR calc non Af Amer: 74 mL/min/{1.73_m2} (ref >59)
Glucose: 80 mg/dL (ref 65–99)
Phosphorus: 3.1 mg/dL (ref 2.8–4.1)
Potassium: 5.1 mmol/L (ref 3.5–5.2)
Sodium: 141 mmol/L (ref 134–144)

## 2019-08-27 ENCOUNTER — Other Ambulatory Visit: Payer: Self-pay | Admitting: Family Medicine

## 2019-08-27 DIAGNOSIS — R972 Elevated prostate specific antigen [PSA]: Secondary | ICD-10-CM

## 2019-08-28 ENCOUNTER — Other Ambulatory Visit: Payer: Self-pay

## 2019-08-28 ENCOUNTER — Other Ambulatory Visit: Payer: Medicare Other

## 2019-08-28 DIAGNOSIS — R972 Elevated prostate specific antigen [PSA]: Secondary | ICD-10-CM

## 2019-08-29 LAB — PSA: Prostate Specific Ag, Serum: 2.7 ng/mL (ref 0.0–4.0)

## 2019-08-30 ENCOUNTER — Other Ambulatory Visit: Payer: Self-pay

## 2019-08-30 ENCOUNTER — Ambulatory Visit: Payer: Medicare Other | Admitting: Urology

## 2019-08-30 ENCOUNTER — Encounter: Payer: Self-pay | Admitting: Urology

## 2019-08-30 VITALS — BP 148/77 | HR 68 | Ht 68.0 in | Wt 172.0 lb

## 2019-08-30 DIAGNOSIS — N5201 Erectile dysfunction due to arterial insufficiency: Secondary | ICD-10-CM | POA: Diagnosis not present

## 2019-08-30 DIAGNOSIS — N401 Enlarged prostate with lower urinary tract symptoms: Secondary | ICD-10-CM | POA: Diagnosis not present

## 2019-08-30 DIAGNOSIS — R972 Elevated prostate specific antigen [PSA]: Secondary | ICD-10-CM

## 2019-08-30 MED ORDER — SILDENAFIL CITRATE 20 MG PO TABS: ORAL_TABLET | ORAL | 1 refills | Status: DC

## 2019-08-30 MED ORDER — DUTASTERIDE 0.5 MG PO CAPS: 0.5000 mg | ORAL_CAPSULE | Freq: Every day | ORAL | 1 refills | Status: DC

## 2019-08-30 NOTE — Progress Notes (Signed)
08/30/2019 1:30 PM   Alfred Edwards [birth year] [id number]  Referring provider: Juline Patch, MD [address] [place],  [place] [address]  Chief Complaint  Patient presents with  . Elevated PSA    Urologic history: 1.  Elevated PSA             -Prostate biopsy 2008; PSA 6.2; 52 g gland; benign pathology  2.  BPH with lower urinary tract symptoms             -Dutasteride 3 times weekly  3.  Erectile dysfunction             -Generic sildenafil  HPI: 73 y.o. male presents for annual follow-up.  He has stable lower urinary tract symptoms and remains on dutasteride.  IPSS completed today was 4/35 with a quality of life rated 2/6.  Denies dysuria or gross hematuria.  PSA drawn earlier this week is stable at 2.7.  He remains on sildenafil at 40 mg which has been effective.   PMH: Past Medical History:  Diagnosis Date  . BPH (benign prostatic hypertrophy)   . GERD (gastroesophageal reflux disease)   . Hyperlipidemia   . Hypertension     Surgical History: Past Surgical History:  Procedure Laterality Date  . COLONOSCOPY  2005, 01/2015   Dr Vira Agar- cleared for 3 yrs  . TONSILLECTOMY    . UPPER GASTROINTESTINAL ENDOSCOPY  01/2015   Dr Vira Agar- erosions on esophagus    Home Medications:  Allergies as of 08/30/2019   No Known Allergies     Medication List       Accurate as of August 30, 2019  1:30 PM. If you have any questions, ask your nurse or doctor.        aspirin 81 MG EC tablet Commonly known as: ASPIRIN LOW DOSE Take 1 tablet (81 mg total) by mouth daily. Swallow whole.   CENTRUM SILVER ADULT 50+ PO Take 1 tablet by mouth daily.   CoQ-10 100 MG Caps Take 1 capsule by mouth daily.   dutasteride 0.5 MG capsule Commonly known as: AVODART Take 1 capsule (0.5 mg total) by mouth daily. What changed:   when to take this  additional instructions   Fish Oil 1000 MG Caps Take 1 capsule (1,000 mg total) by mouth 2 (two) times  daily.   lisinopril 10 MG tablet Commonly known as: ZESTRIL Take 1 tablet (10 mg total) by mouth daily.   NIFEdipine 30 MG 24 hr tablet Commonly known as: PROCARDIA-XL/NIFEDICAL-XL TAKE 1 TABLET (30 MG TOTAL) BY MOUTH DAILY.   sildenafil 20 MG tablet Commonly known as: REVATIO 1-5 tabs as needed 1 hour prior to intercourse   simvastatin 20 MG tablet Commonly known as: ZOCOR Take 1 tablet (20 mg total) by mouth daily.   Vitamin C 500 MG Caps Take 1 capsule by mouth 2 (two) times daily.   Vitamin D3 50 MCG (2000 UT) capsule Take 1 capsule by mouth daily.   Zinc 25 MG Tabs Take 25 mg by mouth daily.       Allergies: No Known Allergies  Family History: Family History  Problem Relation Age of Onset  . Heart disease Father   . Heart disease Brother     Social History:  reports that he quit smoking about 47 years ago. His smoking use included cigarettes. He has never used smokeless tobacco. He reports current alcohol use. He reports that he does not use drugs.  ROS: UROLOGY Frequent Urination?: No  Hard to postpone urination?: No Burning/pain with urination?: No Get up at night to urinate?: Yes Leakage of urine?: No Urine stream starts and stops?: No Trouble starting stream?: No Do you have to strain to urinate?: No Blood in urine?: No Urinary tract infection?: No Sexually transmitted disease?: No Injury to kidneys or bladder?: No Painful intercourse?: No Weak stream?: No Erection problems?: Yes Penile pain?: No  Gastrointestinal Nausea?: No Vomiting?: No Indigestion/heartburn?: No Diarrhea?: No Constipation?: No  Constitutional Fever: No Night sweats?: No Weight loss?: No Fatigue?: No  Skin Skin rash/lesions?: No Itching?: No  Eyes Blurred vision?: No Double vision?: No  Ears/Nose/Throat Sore throat?: No Sinus problems?: No  Hematologic/Lymphatic Swollen glands?: No Easy bruising?: No  Cardiovascular Leg swelling?: No Chest pain?:  No  Respiratory Cough?: No Shortness of breath?: No  Endocrine Excessive thirst?: No  Musculoskeletal Back pain?: No Joint pain?: No  Neurological Headaches?: No Dizziness?: No  Psychologic Depression?: No Anxiety?: No  Physical Exam: BP (!) 148/77   Pulse 68   Ht 5\' 8"  (1.727 m)   Wt 172 lb (78 kg)   BMI 26.15 kg/m   Constitutional:  Alert and oriented, No acute distress. HEENT: [place] AT, moist mucus membranes.  Trachea midline, no masses. Cardiovascular: No clubbing, cyanosis, or edema. Respiratory: Normal respiratory effort, no increased work of breathing. GU: Prostate 50 g, smooth without nodules Skin: No rashes, bruises or suspicious lesions. Neurologic: Grossly intact, no focal deficits, moving all 4 extremities. Psychiatric: Normal mood and affect.  Assessment & Plan:    - Elevated PSA Uncorrected PSA stable at 2.7.  Benign DRE.  Continue annual follow-up.  - BPH with lower urinary tract symptoms Stable on dutasteride.  Refill sent  - Erectile dysfunction Sildenafil with good efficacy.  Refill sent   Abbie Sons, MD  [facility] [address] [place], [place] [address] [phone]

## 2019-10-14 ENCOUNTER — Ambulatory Visit (INDEPENDENT_AMBULATORY_CARE_PROVIDER_SITE_OTHER): Payer: Medicare Other

## 2019-10-14 VITALS — Ht 68.0 in | Wt 167.0 lb

## 2019-10-14 DIAGNOSIS — Z0000 Encounter for general adult medical examination without abnormal findings: Secondary | ICD-10-CM

## 2019-10-14 NOTE — Patient Instructions (Signed)
Alfred Edwards , Thank you for taking time to come for your Medicare Wellness Visit. I appreciate your ongoing commitment to your health goals. Please review the following plan we discussed and let me know if I can assist you in the future.   Screening recommendations/referrals: Colonoscopy: done 02/13/18. Repeat in 2024. Recommended yearly ophthalmology/optometry visit for glaucoma screening and checkup Recommended yearly dental visit for hygiene and checkup  Vaccinations: Influenza vaccine: 05/15/19 Pneumococcal vaccine: done 08/22/18 Tdap vaccine: done 12/06/12 Shingles vaccine: Shingrix discussed. Please contact your pharmacy for coverage information.   Conditions/risks identified: recommend healthy eating and physical activity for desired weight loss  Next appointment: Please follow up in one year for your Medicare Annual Wellness visit.    Preventive Care 48 Years and Older, Male Preventive care refers to lifestyle choices and visits with your health care provider that can promote health and wellness. What does preventive care include?  A yearly physical exam. This is also called an annual well check.  Dental exams once or twice a year.  Routine eye exams. Ask your health care provider how often you should have your eyes checked.  Personal lifestyle choices, including:  Daily care of your teeth and gums.  Regular physical activity.  Eating a healthy diet.  Avoiding tobacco and drug use.  Limiting alcohol use.  Practicing safe sex.  Taking low doses of aspirin every day.  Taking vitamin and mineral supplements as recommended by your health care provider. What happens during an annual well check? The services and screenings done by your health care provider during your annual well check will depend on your age, overall health, lifestyle risk factors, and family history of disease. Counseling  Your health care provider may ask you questions about your:  Alcohol  use.  Tobacco use.  Drug use.  Emotional well-being.  Home and relationship well-being.  Sexual activity.  Eating habits.  History of falls.  Memory and ability to understand (cognition).  Work and work Statistician. Screening  You may have the following tests or measurements:  Height, weight, and BMI.  Blood pressure.  Lipid and cholesterol levels. These may be checked every 5 years, or more frequently if you are over 52 years old.  Skin check.  Lung cancer screening. You may have this screening every year starting at age 80 if you have a 30-pack-year history of smoking and currently smoke or have quit within the past 15 years.  Fecal occult blood test (FOBT) of the stool. You may have this test every year starting at age 61.  Flexible sigmoidoscopy or colonoscopy. You may have a sigmoidoscopy every 5 years or a colonoscopy every 10 years starting at age 27.  Prostate cancer screening. Recommendations will vary depending on your family history and other risks.  Hepatitis C blood test.  Hepatitis B blood test.  Sexually transmitted disease (STD) testing.  Diabetes screening. This is done by checking your blood sugar (glucose) after you have not eaten for a while (fasting). You may have this done every 1-3 years.  Abdominal aortic aneurysm (AAA) screening. You may need this if you are a current or former smoker.  Osteoporosis. You may be screened starting at age 53 if you are at high risk. Talk with your health care provider about your test results, treatment options, and if necessary, the need for more tests. Vaccines  Your health care provider may recommend certain vaccines, such as:  Influenza vaccine. This is recommended every year.  Tetanus, diphtheria, and acellular  pertussis (Tdap, Td) vaccine. You may need a Td booster every 10 years.  Zoster vaccine. You may need this after age 31.  Pneumococcal 13-valent conjugate (PCV13) vaccine. One dose is  recommended after age 86.  Pneumococcal polysaccharide (PPSV23) vaccine. One dose is recommended after age 41. Talk to your health care provider about which screenings and vaccines you need and how often you need them. This information is not intended to replace advice given to you by your health care provider. Make sure you discuss any questions you have with your health care provider. Document Released: 09/25/2015 Document Revised: 05/18/2016 Document Reviewed: 06/30/2015 Elsevier Interactive Patient Education  2017 [place] Prevention in the Home Falls can cause injuries. They can happen to people of all ages. There are many things you can do to make your home safe and to help prevent falls. What can I do on the outside of my home?  Regularly fix the edges of walkways and driveways and fix any cracks.  Remove anything that might make you trip as you walk through a door, such as a raised step or threshold.  Trim any bushes or trees on the path to your home.  Use bright outdoor lighting.  Clear any walking paths of anything that might make someone trip, such as rocks or tools.  Regularly check to see if handrails are loose or broken. Make sure that both sides of any steps have handrails.  Any raised decks and porches should have guardrails on the edges.  Have any leaves, snow, or ice cleared regularly.  Use sand or salt on walking paths during winter.  Clean up any spills in your garage right away. This includes oil or grease spills. What can I do in the bathroom?  Use night lights.  Install grab bars by the toilet and in the tub and shower. Do not use towel bars as grab bars.  Use non-skid mats or decals in the tub or shower.  If you need to sit down in the shower, use a plastic, non-slip stool.  Keep the floor dry. Clean up any water that spills on the floor as soon as it happens.  Remove soap buildup in the tub or shower regularly.  Attach bath mats  securely with double-sided non-slip rug tape.  Do not have throw rugs and other things on the floor that can make you trip. What can I do in the bedroom?  Use night lights.  Make sure that you have a light by your bed that is easy to reach.  Do not use any sheets or blankets that are too big for your bed. They should not hang down onto the floor.  Have a firm chair that has side arms. You can use this for support while you get dressed.  Do not have throw rugs and other things on the floor that can make you trip. What can I do in the kitchen?  Clean up any spills right away.  Avoid walking on wet floors.  Keep items that you use a lot in easy-to-reach places.  If you need to reach something above you, use a strong step stool that has a grab bar.  Keep electrical cords out of the way.  Do not use floor polish or wax that makes floors slippery. If you must use wax, use non-skid floor wax.  Do not have throw rugs and other things on the floor that can make you trip. What can I do with my stairs?  Do not leave any items on the stairs.  Make sure that there are handrails on both sides of the stairs and use them. Fix handrails that are broken or loose. Make sure that handrails are as long as the stairways.  Check any carpeting to make sure that it is firmly attached to the stairs. Fix any carpet that is loose or worn.  Avoid having throw rugs at the top or bottom of the stairs. If you do have throw rugs, attach them to the floor with carpet tape.  Make sure that you have a light switch at the top of the stairs and the bottom of the stairs. If you do not have them, ask someone to add them for you. What else can I do to help prevent falls?  Wear shoes that:  Do not have high heels.  Have rubber bottoms.  Are comfortable and fit you well.  Are closed at the toe. Do not wear sandals.  If you use a stepladder:  Make sure that it is fully opened. Do not climb a closed  stepladder.  Make sure that both sides of the stepladder are locked into place.  Ask someone to hold it for you, if possible.  Clearly mark and make sure that you can see:  Any grab bars or handrails.  First and last steps.  Where the edge of each step is.  Use tools that help you move around (mobility aids) if they are needed. These include:  Canes.  Walkers.  Scooters.  Crutches.  Turn on the lights when you go into a dark area. Replace any light bulbs as soon as they burn out.  Set up your furniture so you have a clear path. Avoid moving your furniture around.  If any of your floors are uneven, fix them.  If there are any pets around you, be aware of where they are.  Review your medicines with your doctor. Some medicines can make you feel dizzy. This can increase your chance of falling. Ask your doctor what other things that you can do to help prevent falls. This information is not intended to replace advice given to you by your health care provider. Make sure you discuss any questions you have with your health care provider. Document Released: 06/25/2009 Document Revised: 02/04/2016 Document Reviewed: 10/03/2014 Elsevier Interactive Patient Education  2017 [employer].

## 2019-10-14 NOTE — Progress Notes (Signed)
Subjective:   Alfred Edwards is a 74 y.o. male who presents for Medicare Annual/Subsequent preventive examination.  Virtual Visit via Telephone Note  I connected with Alfred Edwards on 10/14/19 at  8:40 AM EST by telephone and verified that I am speaking with the correct person using two identifiers.  Medicare Annual Wellness visit completed telephonically due to Covid-19 pandemic.   Location: Patient: home Provider: office   I discussed the limitations, risks, security and privacy concerns of performing an evaluation and management service by telephone and the availability of in person appointments. The patient expressed understanding and agreed to proceed.  Some vital signs may be absent or patient reported.   Clemetine Marker, LPN    Review of Systems:   Cardiac Risk Factors include: advanced age (>39men, >44 women);dyslipidemia;hypertension;male gender     Objective:    Vitals: Ht 5\' 8"  (1.727 m)   Wt 167 lb (75.8 kg)   BMI 25.39 kg/m   Body mass index is 25.39 kg/m.  Advanced Directives 11/28/2018 10/10/2018 07/24/2017 06/29/2015  Does Patient Have a Medical Advance Directive? No Yes Yes Yes  Type of Advance Directive - Living will;Healthcare Power of [place];Living will [place];Living will  Copy of [place] in Chart? - Yes - validated most recent copy scanned in chart (See row information) No - copy requested No - copy requested  Would patient like information on creating a medical advance directive? No - Patient declined - - -    Tobacco Social History   Tobacco Use  Smoking Status Former Smoker  . Types: Cigarettes  . Quit date: 09/13/1971  . Years since quitting: 48.1  Smokeless Tobacco Never Used     Counseling given: Not Answered   Clinical Intake:  Pre-visit preparation completed: Yes  Pain : No/denies pain     BMI - recorded: 25.39 Nutritional Status: BMI 25 -29  Overweight Nutritional Risks: None Diabetes: No  How often do you need to have someone help you when you read instructions, pamphlets, or other written materials from your doctor or pharmacy?: 1 - Never  Interpreter Needed?: No  Information entered by :: Clemetine Marker LPN  Past Medical History:  Diagnosis Date  . BPH (benign prostatic hypertrophy)   . GERD (gastroesophageal reflux disease)   . Hyperlipidemia   . Hypertension   . TIA (transient ischemic attack) 11/2018   Past Surgical History:  Procedure Laterality Date  . COLONOSCOPY  2005, 01/2015   Dr Vira Agar- cleared for 3 yrs  . TONSILLECTOMY    . UPPER GASTROINTESTINAL ENDOSCOPY  01/2015   Dr Vira Agar- erosions on esophagus   Family History  Problem Relation Age of Onset  . Heart disease Father   . Heart disease Brother    Social History   Socioeconomic History  . Marital status: Widowed    Spouse name: Not on file  . Number of children: 0  . Years of education: Not on file  . Highest education level: Bachelor's degree (e.g., BA, AB, BS)  Occupational History  . Occupation: Retired  Tobacco Use  . Smoking status: Former Smoker    Types: Cigarettes    Quit date: 09/13/1971    Years since quitting: 48.1  . Smokeless tobacco: Never Used  Substance and Sexual Activity  . Alcohol use: Yes    Alcohol/week: 0.0 standard drinks    Comment: socially  . Drug use: No  . Sexual activity: Yes  Other Topics  Concern  . Not on file  Social History Narrative   Wife passed in 2012 from cancer   Social Determinants of Health   Financial Resource Strain: Low Risk   . Difficulty of Paying Living Expenses: Not hard at all  Food Insecurity: No Food Insecurity  . Worried About Charity fundraiser in the Last Year: Never true  . Ran Out of Food in the Last Year: Never true  Transportation Needs: No Transportation Needs  . Lack of Transportation (Medical): No  . Lack of Transportation (Non-Medical): No  Physical Activity:  Sufficiently Active  . Days of Exercise per Week: 3 days  . Minutes of Exercise per Session: 60 min  Stress: No Stress Concern Present  . Feeling of Stress : Not at all  Social Connections: Somewhat Isolated  . Frequency of Communication with Friends and Family: More than three times a week  . Frequency of Social Gatherings with Friends and Family: Three times a week  . Attends Religious Services: More than 4 times per year  . Active Member of Clubs or Organizations: No  . Attends Archivist Meetings: Never  . Marital Status: Widowed    Outpatient Encounter Medications as of 10/14/2019  Medication Sig  . Ascorbic Acid (VITAMIN C) 500 MG CAPS Take 1 capsule by mouth 2 (two) times daily.   Marland Kitchen aspirin (ASPIRIN LOW DOSE) 81 MG EC tablet Take 1 tablet (81 mg total) by mouth daily. Swallow whole.  . Cholecalciferol (VITAMIN D3) 2000 UNITS capsule Take 1 capsule by mouth daily.  . Coenzyme Q10 (COQ-10) 100 MG CAPS Take 1 capsule by mouth daily.  Marland Kitchen dutasteride (AVODART) 0.5 MG capsule Take 1 capsule (0.5 mg total) by mouth daily.  Marland Kitchen lisinopril (ZESTRIL) 10 MG tablet Take 1 tablet (10 mg total) by mouth daily.  . Multiple Vitamins-Minerals (CENTRUM SILVER ADULT 50+ PO) Take 1 tablet by mouth daily.  Marland Kitchen NIFEdipine (PROCARDIA-XL/NIFEDICAL-XL) 30 MG 24 hr tablet TAKE 1 TABLET (30 MG TOTAL) BY MOUTH DAILY.  Marland Kitchen Omega-3 Fatty Acids (FISH OIL) 1000 MG CAPS Take 1 capsule (1,000 mg total) by mouth 2 (two) times daily.  . sildenafil (REVATIO) 20 MG tablet 1-5 tabs as needed 1 hour prior to intercourse  . simvastatin (ZOCOR) 20 MG tablet Take 1 tablet (20 mg total) by mouth daily.  . Zinc 25 MG TABS Take 25 mg by mouth daily.    No facility-administered encounter medications on file as of 10/14/2019.    Activities of Daily Living In your present state of health, do you have any difficulty performing the following activities: 10/14/2019 11/28/2018  Hearing? Y N  Comment declines hearing aids -    Vision? N N  Difficulty concentrating or making decisions? N N  Walking or climbing stairs? N N  Dressing or bathing? N N  Doing errands, shopping? N N  Preparing Food and eating ? N -  Using the Toilet? N -  In the past six months, have you accidently leaked urine? N -  Do you have problems with loss of bowel control? N -  Managing your Medications? N -  Managing your Finances? N -  Housekeeping or managing your Housekeeping? N -  Some recent data might be hidden    Patient Care Team: Juline Patch, MD as PCP - General (Family Medicine) Abbie Sons, MD as Consulting Physician (Urology) Jannet Mantis, MD as Consulting Physician (Dermatology)   Assessment:   This is a routine wellness examination  for [employer].  Exercise Activities and Dietary recommendations Current Exercise Habits: Home exercise routine, Type of exercise: strength training/weights;Other - see comments(golf), Time (Minutes): 60, Frequency (Times/Week): 3, Weekly Exercise (Minutes/Week): 180, Intensity: Mild, Exercise limited by: None identified  Goals    . Reduce sugar intake to X grams per day     Continue to reduce sweets and carbs from diet    . Weight (lb) < 160 lb (72.6 kg)     Pt would like to lose weight to get to goal of 160       Fall Risk Fall Risk  10/14/2019 10/10/2018 02/21/2018 07/24/2017 02/02/2017  Falls in the past year? 0 0 No No No  Number falls in past yr: 0 0 - - -  Injury with Fall? 0 - - - -  Risk for fall due to : No Fall Risks - - - -  Follow up Falls prevention discussed Falls prevention discussed - - -   FALL RISK PREVENTION PERTAINING TO THE HOME:  Any stairs in or around the home? No  If so, do they handrails? No   Home free of loose throw rugs in walkways, pet beds, electrical cords, etc? Yes  Adequate lighting in your home to reduce risk of falls? Yes   ASSISTIVE DEVICES UTILIZED TO PREVENT FALLS:  Life alert? No  Use of a cane, walker or w/c? No  Grab  bars in the bathroom? No  Shower chair or bench in shower? Yes  Elevated toilet seat or a handicapped toilet? Yes   DME ORDERS:  DME order needed?  No   TIMED UP AND GO:  Was the test performed? No . Telephonic visit.   Education: Fall risk prevention has been discussed.  Intervention(s) required? No    Depression Screen PHQ 2/9 Scores 10/14/2019 08/21/2019 02/20/2019 12/07/2018  PHQ - 2 Score 0 0 0 0  PHQ- 9 Score - 0 0 0    Cognitive Function     6CIT Screen 10/14/2019 10/10/2018 07/24/2017  What Year? 0 points 0 points 0 points  What month? 0 points 0 points 0 points  What time? 0 points 0 points 0 points  Count back from 20 0 points 0 points 0 points  Months in reverse 0 points 0 points 0 points  Repeat phrase 0 points 0 points 0 points  Total Score 0 0 0    Immunization History  Administered Date(s) Administered  . Influenza, High Dose Seasonal PF 06/22/2017, 07/24/2018, 05/15/2019  . Influenza,inj,Quad PF,6+ Mos 07/01/2013, 06/27/2014, 06/29/2015, 08/02/2016  . Influenza-Unspecified 06/27/2014  . Pneumococcal Conjugate-13 07/24/2017  . Pneumococcal Polysaccharide-23 06/23/2008, 08/22/2018  . Tdap 12/06/2012    Qualifies for Shingles Vaccine? Yes . Due for Shingrix. Education has been provided regarding the importance of this vaccine. Pt has been advised to call insurance company to determine out of pocket expense. Advised may also receive vaccine at local pharmacy or Health Dept. Verbalized acceptance and understanding.  Tdap: Up to date   Flu Vaccine: Up to date  Pneumococcal Vaccine: Up to date   Screening Tests Health Maintenance  Topic Date Due  . TETANUS/TDAP  12/07/2022  . COLONOSCOPY  02/14/2023  . INFLUENZA VACCINE  Completed  . Hepatitis C Screening  Completed  . PNA vac Low Risk Adult  Completed   Cancer Screenings:  Colorectal Screening: Completed 02/13/18. Repeat every 5 years;   Lung Cancer Screening: (Low Dose CT Chest recommended if Age  32-80 years, 30 pack-year currently smoking OR have  quit w/in 15years.) does not qualify.   Additional Screening:  Hepatitis C Screening: does qualify; Completed 07/24/17  Vision Screening: Recommended annual ophthalmology exams for early detection of glaucoma and other disorders of the eye. Is the patient up to date with their annual eye exam?  No  Who is the provider or what is the name of the office in which the pt attends annual eye exams? Not established If pt is not established with a provider, would they like to be referred to a provider to establish care? No .   Dental Screening: Recommended annual dental exams for proper oral hygiene  Community Resource Referral:  CRR required this visit?  No       Plan:    I have personally reviewed and addressed the Medicare Annual Wellness questionnaire and have noted the following in the patient's chart:  A. Medical and social history B. Use of alcohol, tobacco or illicit drugs  C. Current medications and supplements D. Functional ability and status E.  Nutritional status F.  Physical activity G. Advance directives H. List of other physicians I.  Hospitalizations, surgeries, and ER visits in previous 12 months J.  [place] such as hearing and vision if needed, cognitive and depression L. Referrals and appointments   In addition, I have reviewed and discussed with patient certain preventive protocols, quality metrics, and best practice recommendations. A written personalized care plan for preventive services as well as general preventive health recommendations were provided to patient.   Signed,  Clemetine Marker, LPN Nurse Health Advisor   Nurse Notes: phone numbers provided for [facility] and [place] for Covid vaccine

## 2020-02-12 DIAGNOSIS — L570 Actinic keratosis: Secondary | ICD-10-CM | POA: Diagnosis not present

## 2020-02-12 DIAGNOSIS — Z872 Personal history of diseases of the skin and subcutaneous tissue: Secondary | ICD-10-CM | POA: Diagnosis not present

## 2020-02-12 DIAGNOSIS — Z86018 Personal history of other benign neoplasm: Secondary | ICD-10-CM | POA: Diagnosis not present

## 2020-02-12 DIAGNOSIS — L821 Other seborrheic keratosis: Secondary | ICD-10-CM | POA: Diagnosis not present

## 2020-02-12 DIAGNOSIS — L578 Other skin changes due to chronic exposure to nonionizing radiation: Secondary | ICD-10-CM | POA: Diagnosis not present

## 2020-02-21 ENCOUNTER — Encounter: Payer: Self-pay | Admitting: Family Medicine

## 2020-02-21 ENCOUNTER — Ambulatory Visit (INDEPENDENT_AMBULATORY_CARE_PROVIDER_SITE_OTHER): Payer: Medicare Other | Admitting: Family Medicine

## 2020-02-21 ENCOUNTER — Other Ambulatory Visit: Payer: Self-pay

## 2020-02-21 VITALS — BP 120/70 | HR 76 | Ht 68.0 in | Wt 175.0 lb

## 2020-02-21 DIAGNOSIS — I10 Essential (primary) hypertension: Secondary | ICD-10-CM

## 2020-02-21 DIAGNOSIS — E7849 Other hyperlipidemia: Secondary | ICD-10-CM

## 2020-02-21 DIAGNOSIS — I679 Cerebrovascular disease, unspecified: Secondary | ICD-10-CM | POA: Diagnosis not present

## 2020-02-21 MED ORDER — NIFEDIPINE ER OSMOTIC RELEASE 30 MG PO TB24: ORAL_TABLET | ORAL | 1 refills | Status: DC

## 2020-02-21 MED ORDER — LISINOPRIL 10 MG PO TABS: 10.0000 mg | ORAL_TABLET | Freq: Every day | ORAL | 1 refills | Status: DC

## 2020-02-21 MED ORDER — SIMVASTATIN 20 MG PO TABS: 20.0000 mg | ORAL_TABLET | Freq: Every day | ORAL | 1 refills | Status: DC

## 2020-02-21 NOTE — Progress Notes (Signed)
Date:  02/21/2020   Name:  Alfred Edwards   DOB:  03-Apr-1946   MRN:  [id number]   Chief Complaint: Hypertension and Hyperlipidemia  Hypertension This is a chronic problem. The current episode started more than 1 year ago. The problem has been gradually improving since onset. The problem is controlled. Pertinent negatives include no anxiety, blurred vision, chest pain, headaches, malaise/fatigue, neck pain, orthopnea, palpitations, peripheral edema, PND, shortness of breath or sweats. There are no associated agents to hypertension. Risk factors for coronary artery disease include obesity. Past treatments include ACE inhibitors and calcium channel blockers. The current treatment provides moderate improvement. There are no compliance problems.  There is no history of angina, kidney disease, CAD/MI, CVA, heart failure, left ventricular hypertrophy, PVD or retinopathy. There is no history of chronic renal disease, a hypertension causing med or renovascular disease.  Hyperlipidemia This is a chronic problem. The current episode started more than 1 year ago. The problem is controlled. Recent lipid tests were reviewed and are normal. He has no history of chronic renal disease. Pertinent negatives include no chest pain, focal sensory loss, focal weakness, leg pain, myalgias or shortness of breath. Current antihyperlipidemic treatment includes statins. The current treatment provides moderate improvement of lipids. There are no compliance problems.  Risk factors for coronary artery disease include dyslipidemia, hypertension and male sex.    Lab Results  Component Value Date   CREATININE 1.02 08/21/2019   BUN 17 08/21/2019   NA 141 08/21/2019   K 5.1 08/21/2019   CL 104 08/21/2019   CO2 21 08/21/2019   Lab Results  Component Value Date   CHOL 155 08/21/2019   HDL 74 08/21/2019   LDLCALC 65 08/21/2019   TRIG 83 08/21/2019   CHOLHDL 2.1 08/21/2019   No results found for: TSH Lab Results    Component Value Date   HGBA1C 5.5 11/28/2018   Lab Results  Component Value Date   WBC 7.3 11/28/2018   HGB 12.8 (L) 11/28/2018   HCT 39.7 11/28/2018   MCV 84.8 11/28/2018   PLT 264 11/28/2018   Lab Results  Component Value Date   ALT 20 11/28/2018   AST 23 11/28/2018   ALKPHOS 48 11/28/2018   BILITOT 0.5 11/28/2018     Review of Systems  Constitutional: Negative for chills, fever and malaise/fatigue.  HENT: Negative for drooling, ear discharge, ear pain and sore throat.   Eyes: Negative for blurred vision.  Respiratory: Negative for cough, shortness of breath and wheezing.   Cardiovascular: Negative for chest pain, palpitations, orthopnea, leg swelling and PND.  Gastrointestinal: Negative for abdominal pain, blood in stool, constipation, diarrhea and nausea.  Endocrine: Negative for polydipsia.  Genitourinary: Negative for dysuria, frequency, hematuria and urgency.  Musculoskeletal: Negative for back pain, myalgias and neck pain.  Skin: Negative for rash.  Allergic/Immunologic: Negative for environmental allergies.  Neurological: Negative for dizziness, focal weakness and headaches.  Hematological: Does not bruise/bleed easily.  Psychiatric/Behavioral: Negative for suicidal ideas. The patient is not nervous/anxious.     Patient Active Problem List   Diagnosis Date Noted  . CVA (cerebral vascular accident) ([place]) 11/29/2018  . TIA (transient ischemic attack) 11/28/2018  . Elevated PSA 08/28/2017  . Erectile dysfunction 08/28/2017  . Benign prostatic hyperplasia with lower urinary tract symptoms 08/28/2017  . Venous insufficiency 02/02/2017  . Essential hypertension 12/28/2015  . Hyperlipidemia 12/28/2015  . Nocturia associated with benign prostatic hypertrophy 12/28/2015  . Familial multiple lipoprotein-type hyperlipidemia 01/23/2015  .  Anxiety disorder due to known physiological condition 01/23/2015  . Routine general medical examination at a health care facility  01/23/2015  . Adjustment reaction 01/23/2015  . Essential (primary) hypertension 01/23/2015  . Proteinuria 06/01/2012    No Known Allergies  Past Surgical History:  Procedure Laterality Date  . COLONOSCOPY  2005, 01/2015   Dr Vira Agar- cleared for 3 yrs  . TONSILLECTOMY    . UPPER GASTROINTESTINAL ENDOSCOPY  01/2015   Dr Vira Agar- erosions on esophagus    Social History   Tobacco Use  . Smoking status: Former Smoker    Types: Cigarettes    Quit date: 09/13/1971    Years since quitting: 48.4  . Smokeless tobacco: Never Used  Vaping Use  . Vaping Use: Never used  Substance Use Topics  . Alcohol use: Yes    Alcohol/week: 0.0 standard drinks    Comment: socially  . Drug use: No     Medication list has been reviewed and updated.  Current Meds  Medication Sig  . Ascorbic Acid (VITAMIN C) 500 MG CAPS Take 1 capsule by mouth 2 (two) times daily.   Marland Kitchen aspirin (ASPIRIN LOW DOSE) 81 MG EC tablet Take 1 tablet (81 mg total) by mouth daily. Swallow whole.  . Cholecalciferol (VITAMIN D3) 2000 UNITS capsule Take 1 capsule by mouth daily.  . Coenzyme Q10 (COQ-10) 100 MG CAPS Take 1 capsule by mouth daily.  Marland Kitchen dutasteride (AVODART) 0.5 MG capsule Take 1 capsule (0.5 mg total) by mouth daily. (Patient taking differently: Take 0.5 mg by mouth 3 (three) times a week. urologist)  . lisinopril (ZESTRIL) 10 MG tablet Take 1 tablet (10 mg total) by mouth daily.  . Multiple Vitamins-Minerals (CENTRUM SILVER ADULT 50+ PO) Take 1 tablet by mouth daily.  Marland Kitchen NIFEdipine (PROCARDIA-XL/NIFEDICAL-XL) 30 MG 24 hr tablet TAKE 1 TABLET (30 MG TOTAL) BY MOUTH DAILY.  Marland Kitchen Omega-3 Fatty Acids (FISH OIL) 1000 MG CAPS Take 1 capsule (1,000 mg total) by mouth 2 (two) times daily.  . sildenafil (REVATIO) 20 MG tablet 1-5 tabs as needed 1 hour prior to intercourse  . simvastatin (ZOCOR) 20 MG tablet Take 1 tablet (20 mg total) by mouth daily.  . Zinc 25 MG TABS Take 25 mg by mouth daily.     PHQ 2/9 Scores 02/21/2020  02/21/2020 10/14/2019 08/21/2019  PHQ - 2 Score 0 0 0 0  PHQ- 9 Score 0 0 - 0    BP Readings from Last 3 Encounters:  02/21/20 120/70  08/30/19 (!) 148/77  08/21/19 138/70    Physical Exam Vitals and nursing note reviewed.  HENT:     Head: Normocephalic.     Right Ear: Tympanic membrane, ear canal and external ear normal.     Left Ear: Tympanic membrane, ear canal and external ear normal.     Nose: Nose normal.  Eyes:     General: No scleral icterus.       Right eye: No discharge.        Left eye: No discharge.     Conjunctiva/sclera: Conjunctivae normal.     Pupils: Pupils are equal, round, and reactive to light.  Neck:     Thyroid: No thyromegaly.     Vascular: No JVD.     Trachea: No tracheal deviation.  Cardiovascular:     Rate and Rhythm: Normal rate and regular rhythm.     Heart sounds: Normal heart sounds. No murmur heard.  No friction rub. No gallop.   Pulmonary:  Effort: No respiratory distress.     Breath sounds: Normal breath sounds. No wheezing, rhonchi or rales.  Abdominal:     General: Bowel sounds are normal.     Palpations: Abdomen is soft. There is no mass.     Tenderness: There is no abdominal tenderness. There is no guarding or rebound.  Musculoskeletal:        General: No tenderness. Normal range of motion.     Cervical back: Normal range of motion and neck supple.  Lymphadenopathy:     Cervical: No cervical adenopathy.  Skin:    General: Skin is warm.     Findings: No rash.  Neurological:     Mental Status: He is alert and oriented to person, place, and time.     Cranial Nerves: No cranial nerve deficit.     Deep Tendon Reflexes: Reflexes are normal and symmetric.     Wt Readings from Last 3 Encounters:  02/21/20 175 lb (79.4 kg)  10/14/19 167 lb (75.8 kg)  08/30/19 172 lb (78 kg)    BP 120/70   Pulse 76   Ht 5\' 8"  (1.727 m)   Wt 175 lb (79.4 kg)   BMI 26.61 kg/m   Assessment and Plan:  1. Essential (primary)  hypertension Chronic.  Controlled.  Stable.  Continue lisinopril 10 mg and nifedipine XL 30 mg once a day.  Review of December is renal function is normal and we will not do lab work at this time.  Patient will continue to take medication and maintain weight - lisinopril (ZESTRIL) 10 MG tablet; Take 1 tablet (10 mg total) by mouth daily.  Dispense: 90 tablet; Refill: 1 - NIFEdipine (PROCARDIA-XL/NIFEDICAL-XL) 30 MG 24 hr tablet; TAKE 1 TABLET (30 MG TOTAL) BY MOUTH DAILY.  Dispense: 90 tablet; Refill: 1  2. Familial multiple lipoprotein-type hyperlipidemia Chronic.  Controlled.  Stable.  Reviewed the patient's lipid is excellent and we will do lipid panel at this time we will do it in December.  We will refill.  Simvastatin 20 mg once a day. - simvastatin (ZOCOR) 20 MG tablet; Take 1 tablet (20 mg total) by mouth daily.  Dispense: 90 tablet; Refill: 1  3. Cerebrovascular disease Review of patient's MRI from 2018 noting there is some cerebrovascular disease and we will continue medication control of dyslipidemia and blood pressure as well as encouraging low-dose aspirin.

## 2020-07-30 ENCOUNTER — Ambulatory Visit: Payer: Medicare Other | Admitting: Family Medicine

## 2020-08-12 ENCOUNTER — Other Ambulatory Visit: Payer: Self-pay | Admitting: Family Medicine

## 2020-08-12 DIAGNOSIS — I10 Essential (primary) hypertension: Secondary | ICD-10-CM

## 2020-08-24 ENCOUNTER — Encounter: Payer: Self-pay | Admitting: Family Medicine

## 2020-08-24 ENCOUNTER — Other Ambulatory Visit: Payer: Self-pay

## 2020-08-24 ENCOUNTER — Ambulatory Visit (INDEPENDENT_AMBULATORY_CARE_PROVIDER_SITE_OTHER): Payer: Medicare Other | Admitting: Family Medicine

## 2020-08-24 VITALS — BP 136/80 | HR 64 | Ht 68.0 in | Wt 175.0 lb

## 2020-08-24 DIAGNOSIS — Z23 Encounter for immunization: Secondary | ICD-10-CM | POA: Diagnosis not present

## 2020-08-24 DIAGNOSIS — I679 Cerebrovascular disease, unspecified: Secondary | ICD-10-CM

## 2020-08-24 DIAGNOSIS — E7849 Other hyperlipidemia: Secondary | ICD-10-CM | POA: Diagnosis not present

## 2020-08-24 DIAGNOSIS — I10 Essential (primary) hypertension: Secondary | ICD-10-CM | POA: Diagnosis not present

## 2020-08-24 MED ORDER — SIMVASTATIN 20 MG PO TABS: 20.0000 mg | ORAL_TABLET | Freq: Every day | ORAL | 1 refills | Status: DC

## 2020-08-24 MED ORDER — NIFEDIPINE ER OSMOTIC RELEASE 30 MG PO TB24: ORAL_TABLET | ORAL | 1 refills | Status: DC

## 2020-08-24 MED ORDER — LISINOPRIL 10 MG PO TABS: 10.0000 mg | ORAL_TABLET | Freq: Every day | ORAL | 1 refills | Status: DC

## 2020-08-24 NOTE — Progress Notes (Signed)
Date:  08/24/2020   Name:  Alfred Edwards   DOB:  1946-05-19   MRN:  [id number]   Chief Complaint: Hyperlipidemia, Hypertension, and Flu Vaccine  Hyperlipidemia This is a chronic problem. The current episode started more than 1 year ago. The problem is controlled. Recent lipid tests were reviewed and are normal. He has no history of chronic renal disease, diabetes, hypothyroidism, liver disease, obesity or nephrotic syndrome. Pertinent negatives include no chest pain, focal sensory loss, focal weakness, leg pain, myalgias or shortness of breath. Current antihyperlipidemic treatment includes statins. The current treatment provides moderate improvement of lipids. There are no compliance problems.  Risk factors for coronary artery disease include dyslipidemia, hypertension and male sex.  Hypertension This is a chronic problem. The current episode started more than 1 year ago. The problem has been gradually improving since onset. The problem is controlled. Pertinent negatives include no anxiety, blurred vision, chest pain, headaches, malaise/fatigue, neck pain, orthopnea, palpitations, peripheral edema, PND, shortness of breath or sweats. Risk factors for coronary artery disease include dyslipidemia and male gender. Past treatments include calcium channel blockers and ACE inhibitors. The current treatment provides moderate improvement. There are no compliance problems.  There is no history of angina, kidney disease, CAD/MI, CVA, heart failure, left ventricular hypertrophy, PVD or retinopathy. There is no history of chronic renal disease, a hypertension causing med or renovascular disease.  Neurologic Problem The patient's pertinent negatives include no altered mental status, clumsiness, focal sensory loss, focal weakness, loss of balance, memory loss, near-syncope, slurred speech, syncope, visual change or weakness. This is a chronic problem. The current episode started more than 1 year ago. The  neurological problem developed gradually. The problem has been waxing and waning since onset. Pertinent negatives include no abdominal pain, auditory change, aura, back pain, bladder incontinence, bowel incontinence, chest pain, confusion, diaphoresis, dizziness, fatigue, fever, headaches, light-headedness, nausea, neck pain, palpitations, shortness of breath, vertigo or vomiting. Past treatments include aspirin. The treatment provided moderate relief. There is no history of a bleeding disorder, a clotting disorder, a CVA, dementia, head trauma, liver disease, mood changes or seizures.    Lab Results  Component Value Date   CREATININE 1.02 08/21/2019   BUN 17 08/21/2019   NA 141 08/21/2019   K 5.1 08/21/2019   CL 104 08/21/2019   CO2 21 08/21/2019   Lab Results  Component Value Date   CHOL 155 08/21/2019   HDL 74 08/21/2019   LDLCALC 65 08/21/2019   TRIG 83 08/21/2019   CHOLHDL 2.1 08/21/2019   No results found for: TSH Lab Results  Component Value Date   HGBA1C 5.5 11/28/2018   Lab Results  Component Value Date   WBC 7.3 11/28/2018   HGB 12.8 (L) 11/28/2018   HCT 39.7 11/28/2018   MCV 84.8 11/28/2018   PLT 264 11/28/2018   Lab Results  Component Value Date   ALT 20 11/28/2018   AST 23 11/28/2018   ALKPHOS 48 11/28/2018   BILITOT 0.5 11/28/2018     Review of Systems  Constitutional: Negative for chills, diaphoresis, fatigue, fever and malaise/fatigue.  HENT: Negative for drooling, ear discharge, ear pain, mouth sores, nosebleeds, postnasal drip, rhinorrhea and sore throat.   Eyes: Negative for blurred vision.  Respiratory: Negative for cough, shortness of breath and wheezing.   Cardiovascular: Negative for chest pain, palpitations, orthopnea, leg swelling, PND and near-syncope.  Gastrointestinal: Negative for abdominal pain, blood in stool, bowel incontinence, constipation, diarrhea, nausea and vomiting.  Endocrine: Negative for polydipsia.  Genitourinary: Negative  for bladder incontinence, dysuria, frequency, hematuria and urgency.  Musculoskeletal: Negative for back pain, myalgias and neck pain.  Skin: Negative for rash.  Allergic/Immunologic: Negative for environmental allergies.  Neurological: Negative for dizziness, vertigo, focal weakness, syncope, weakness, light-headedness, headaches and loss of balance.  Hematological: Does not bruise/bleed easily.  Psychiatric/Behavioral: Negative for confusion, memory loss and suicidal ideas. The patient is not nervous/anxious.     Patient Active Problem List   Diagnosis Date Noted  . CVA (cerebral vascular accident) ([place]) 11/29/2018  . TIA (transient ischemic attack) 11/28/2018  . Elevated PSA 08/28/2017  . Erectile dysfunction 08/28/2017  . Benign prostatic hyperplasia with lower urinary tract symptoms 08/28/2017  . Venous insufficiency 02/02/2017  . Essential hypertension 12/28/2015  . Hyperlipidemia 12/28/2015  . Nocturia associated with benign prostatic hypertrophy 12/28/2015  . Familial multiple lipoprotein-type hyperlipidemia 01/23/2015  . Anxiety disorder due to known physiological condition 01/23/2015  . Routine general medical examination at a health care facility 01/23/2015  . Adjustment reaction 01/23/2015  . Essential (primary) hypertension 01/23/2015  . Proteinuria 06/01/2012    No Known Allergies  Past Surgical History:  Procedure Laterality Date  . COLONOSCOPY  2005, 01/2015   Dr Vira Agar- cleared for 3 yrs  . TONSILLECTOMY    . UPPER GASTROINTESTINAL ENDOSCOPY  01/2015   Dr Vira Agar- erosions on esophagus    Social History   Tobacco Use  . Smoking status: Former Smoker    Types: Cigarettes    Quit date: 09/13/1971    Years since quitting: 48.9  . Smokeless tobacco: Never Used  Vaping Use  . Vaping Use: Never used  Substance Use Topics  . Alcohol use: Yes    Alcohol/week: 0.0 standard drinks    Comment: socially  . Drug use: No     Medication list has been reviewed  and updated.  Current Meds  Medication Sig  . Ascorbic Acid (VITAMIN C) 500 MG CAPS Take 1 capsule by mouth 2 (two) times daily.   Marland Kitchen aspirin (ASPIRIN LOW DOSE) 81 MG EC tablet Take 1 tablet (81 mg total) by mouth daily. Swallow whole.  . Cholecalciferol (VITAMIN D3) 2000 UNITS capsule Take 1 capsule by mouth daily.  . Coenzyme Q10 (COQ-10) 100 MG CAPS Take 1 capsule by mouth daily.  Marland Kitchen dutasteride (AVODART) 0.5 MG capsule Take 1 capsule (0.5 mg total) by mouth daily. (Patient taking differently: Take 0.5 mg by mouth 3 (three) times a week. urologist)  . lisinopril (ZESTRIL) 10 MG tablet Take 1 tablet (10 mg total) by mouth daily.  . Multiple Vitamins-Minerals (CENTRUM SILVER ADULT 50+ PO) Take 1 tablet by mouth daily.  Marland Kitchen NIFEdipine (PROCARDIA-XL/NIFEDICAL-XL) 30 MG 24 hr tablet TAKE 1 TABLET BY MOUTH EVERY DAY  . Omega-3 Fatty Acids (FISH OIL) 1000 MG CAPS Take 1 capsule (1,000 mg total) by mouth 2 (two) times daily.  . sildenafil (REVATIO) 20 MG tablet 1-5 tabs as needed 1 hour prior to intercourse  . simvastatin (ZOCOR) 20 MG tablet Take 1 tablet (20 mg total) by mouth daily.  . Zinc 25 MG TABS Take 25 mg by mouth daily.     PHQ 2/9 Scores 08/24/2020 02/21/2020 02/21/2020 10/14/2019  PHQ - 2 Score 0 0 0 0  PHQ- 9 Score 0 0 0 -    GAD 7 : Generalized Anxiety Score 08/24/2020 02/21/2020 02/21/2020  Nervous, Anxious, on Edge 0 0 0  Control/stop worrying 0 0 0  Worry too much - different  things 0 0 0  Trouble relaxing 0 0 0  Restless 0 0 0  Easily annoyed or irritable 0 0 0  Afraid - awful might happen 0 0 0  Total GAD 7 Score 0 0 0    BP Readings from Last 3 Encounters:  08/24/20 136/80  02/21/20 120/70  08/30/19 (!) 148/77    Physical Exam Vitals and nursing note reviewed.  HENT:     Head: Normocephalic.     Right Ear: Tympanic membrane, ear canal and external ear normal. There is no impacted cerumen.     Left Ear: Tympanic membrane, ear canal and external ear normal. There is  no impacted cerumen.     Nose: Nose normal. No congestion or rhinorrhea.     Mouth/Throat:     Mouth: Oropharynx is clear and moist. Mucous membranes are moist.     Pharynx: No oropharyngeal exudate.  Eyes:     General: No scleral icterus.       Right eye: No discharge.        Left eye: No discharge.     Extraocular Movements: EOM normal.     Conjunctiva/sclera: Conjunctivae normal.     Pupils: Pupils are equal, round, and reactive to light.  Neck:     Thyroid: No thyromegaly.     Vascular: No carotid bruit or JVD.     Trachea: No tracheal deviation.  Cardiovascular:     Rate and Rhythm: Normal rate and regular rhythm.     Pulses: Intact distal pulses.     Heart sounds: Normal heart sounds. No murmur heard. No friction rub. No gallop.   Pulmonary:     Effort: No respiratory distress.     Breath sounds: Normal breath sounds. No stridor. No wheezing, rhonchi or rales.  Chest:     Chest wall: No tenderness.  Abdominal:     General: Bowel sounds are normal. There is no distension.     Palpations: Abdomen is soft. There is no hepatosplenomegaly or mass.     Tenderness: There is no abdominal tenderness. There is no CVA tenderness, right CVA tenderness, left CVA tenderness, guarding or rebound.     Hernia: No hernia is present.  Musculoskeletal:        General: No tenderness or edema. Normal range of motion.     Cervical back: Normal range of motion and neck supple. No rigidity or tenderness.  Lymphadenopathy:     Cervical: No cervical adenopathy.  Skin:    General: Skin is warm.     Capillary Refill: Capillary refill takes less than 2 seconds.     Findings: No lesion or rash.  Neurological:     Mental Status: He is alert and oriented to person, place, and time.     Cranial Nerves: No cranial nerve deficit.     Deep Tendon Reflexes: Strength normal and reflexes are normal and symmetric.     Wt Readings from Last 3 Encounters:  08/24/20 175 lb (79.4 kg)  02/21/20 175 lb (79.4  kg)  10/14/19 167 lb (75.8 kg)    BP 136/80   Pulse 64   Ht 5\' 8"  (1.727 m)   Wt 175 lb (79.4 kg)   BMI 26.61 kg/m   Assessment and Plan: 1. Essential (primary) hypertension Chronic.  Controlled.  Stable.  Blood pressure 136/80.  Patient will continue lisinopril 10 mg once a day and nifedipine XL 30 mg once a day.  We will recheck renal function panel for electrolytes and  GFR status. - lisinopril (ZESTRIL) 10 MG tablet; Take 1 tablet (10 mg total) by mouth daily.  Dispense: 90 tablet; Refill: 1 - NIFEdipine (PROCARDIA-XL/NIFEDICAL-XL) 30 MG 24 hr tablet; TAKE 1 TABLET BY MOUTH EVERY DAY  Dispense: 90 tablet; Refill: 1 - Renal Function Panel  2. Familial multiple lipoprotein-type hyperlipidemia Chronic.  Controlled.  Stable.  Continue simvastatin 20 mg daily.  Will check lipid panel with ratio for current status of ratio controlled. - simvastatin (ZOCOR) 20 MG tablet; Take 1 tablet (20 mg total) by mouth daily.  Dispense: 90 tablet; Refill: 1 - Lipid Panel With LDL/HDL Ratio  3. Need for immunization against influenza Discussed and administered. - Flu Vaccine QUAD High Dose(Fluad)  4.  Cerebrovascular disease-patient has had a history of a TIA consistent with manifestation of cerebrovascular disease 1 to 2 years ago.  Patient has not had any symptoms thereafter.  Patient will continue aspirin 81 mg daily.  We discussed risk-benefit of aspirin therapy given the most recent guidelines and patient given his history of documented TIA.  Review of MRI which notes chronic small vessel ischemic disease mild for age.

## 2020-08-25 LAB — LIPID PANEL WITH LDL/HDL RATIO
Cholesterol, Total: 172 mg/dL (ref 100–199)
HDL: 82 mg/dL (ref >39)
LDL Chol Calc (NIH): 78 mg/dL (ref 0–99)
LDL/HDL Ratio: 1 ratio (ref 0.0–3.6)
Triglycerides: 64 mg/dL (ref 0–149)
VLDL Cholesterol Cal: 12 mg/dL (ref 5–40)

## 2020-08-25 LAB — RENAL FUNCTION PANEL
Albumin: 4.2 g/dL (ref 3.7–4.7)
BUN/Creatinine Ratio: 19 (ref 10–24)
BUN: 21 mg/dL (ref 8–27)
CO2: 22 mmol/L (ref 20–29)
Calcium: 8.8 mg/dL (ref 8.6–10.2)
Chloride: 104 mmol/L (ref 96–106)
Creatinine, Ser: 1.1 mg/dL (ref 0.76–1.27)
GFR calc Af Amer: 77 mL/min/{1.73_m2} (ref >59)
GFR calc non Af Amer: 67 mL/min/{1.73_m2} (ref >59)
Glucose: 86 mg/dL (ref 65–99)
Phosphorus: 2.9 mg/dL (ref 2.8–4.1)
Potassium: 5.4 mmol/L — ABNORMAL HIGH (ref 3.5–5.2)
Sodium: 140 mmol/L (ref 134–144)

## 2020-08-31 ENCOUNTER — Other Ambulatory Visit: Payer: Self-pay | Admitting: Family Medicine

## 2020-08-31 DIAGNOSIS — R972 Elevated prostate specific antigen [PSA]: Secondary | ICD-10-CM

## 2020-09-05 ENCOUNTER — Other Ambulatory Visit: Payer: Self-pay | Admitting: Family Medicine

## 2020-09-05 DIAGNOSIS — I10 Essential (primary) hypertension: Secondary | ICD-10-CM

## 2020-09-06 NOTE — Telephone Encounter (Signed)
Short term refill Requested Prescriptions  Pending Prescriptions Disp Refills  . NIFEdipine (PROCARDIA-XL/NIFEDICAL-XL) 30 MG 24 hr tablet [Pharmacy Med Name: NIFEDIPINE ER 30 MG TABLET] 30 tablet 0    Sig: TAKE 1 TABLET BY MOUTH EVERY DAY     Cardiovascular:  Calcium Channel Blockers Passed - 09/05/2020 11:31 AM      Passed - Last BP in normal range    BP Readings from Last 1 Encounters:  08/24/20 136/80         Passed - Valid encounter within last 6 months    Recent Outpatient Visits          1 week ago Essential (primary) hypertension   [facility] Juline Patch, MD   6 months ago Cerebrovascular disease   [facility] Juline Patch, MD   1 year ago Essential (primary) hypertension   [place], Deanna C, MD   1 year ago Essential (primary) hypertension   [facility] Juline Patch, MD   1 year ago Hospital discharge follow-up   [place], Deanna C, MD      Future Appointments            In 1 week Stoioff, Ronda Fairly, MD [place]   In 5 months Juline Patch, MD [facility], [facility]

## 2020-09-07 ENCOUNTER — Other Ambulatory Visit: Payer: Medicare Other

## 2020-09-09 ENCOUNTER — Ambulatory Visit: Payer: Medicare Other | Admitting: Urology

## 2020-09-16 ENCOUNTER — Other Ambulatory Visit: Payer: Medicare Other

## 2020-09-16 ENCOUNTER — Other Ambulatory Visit: Payer: Self-pay

## 2020-09-16 DIAGNOSIS — R972 Elevated prostate specific antigen [PSA]: Secondary | ICD-10-CM

## 2020-09-17 LAB — PSA: Prostate Specific Ag, Serum: 2.5 ng/mL (ref 0.0–4.0)

## 2020-09-18 ENCOUNTER — Other Ambulatory Visit: Payer: Self-pay

## 2020-09-18 ENCOUNTER — Ambulatory Visit (INDEPENDENT_AMBULATORY_CARE_PROVIDER_SITE_OTHER): Payer: Medicare Other | Admitting: Urology

## 2020-09-18 ENCOUNTER — Encounter: Payer: Self-pay | Admitting: Urology

## 2020-09-18 VITALS — BP 161/76 | HR 69 | Ht 68.0 in | Wt 171.0 lb

## 2020-09-18 DIAGNOSIS — N401 Enlarged prostate with lower urinary tract symptoms: Secondary | ICD-10-CM | POA: Diagnosis not present

## 2020-09-18 DIAGNOSIS — N5201 Erectile dysfunction due to arterial insufficiency: Secondary | ICD-10-CM

## 2020-09-18 DIAGNOSIS — R972 Elevated prostate specific antigen [PSA]: Secondary | ICD-10-CM | POA: Diagnosis not present

## 2020-09-18 MED ORDER — TADALAFIL 20 MG PO TABS: ORAL_TABLET | ORAL | 0 refills | Status: DC

## 2020-09-18 NOTE — Progress Notes (Signed)
09/18/2020 12:22 PM   Alfred Edwards November 30, 1945 [id number]  Referring provider: Juline Patch, MD [address] [place],  [place] [address]  Chief Complaint  Patient presents with  . Elevated PSA    Urologic history: 1.Elevated PSA -Prostate biopsy 2008; PSA 6.2; 52 g gland; benign pathology  2.BPH with lower urinary tract symptoms -Dutasteride 3 times weekly  3.Erectile dysfunction -Generic sildenafil  HPI: 75 y.o. male presents for annual follow-up.   Stable lower urinary tract symptoms on dutasteride  Denies dysuria, gross hematuria  No flank, abdominal or pelvic pain  PSA 09/16/2020 stable at 2.5 (uncorrected)  Having bothersome headache with sildenafil and wants to switch to tadalafil   PMH: Past Medical History:  Diagnosis Date  . BPH (benign prostatic hypertrophy)   . GERD (gastroesophageal reflux disease)   . Hyperlipidemia   . Hypertension   . TIA (transient ischemic attack) 11/2018    Surgical History: Past Surgical History:  Procedure Laterality Date  . COLONOSCOPY  2005, 01/2015   Dr Vira Agar- cleared for 3 yrs  . TONSILLECTOMY    . UPPER GASTROINTESTINAL ENDOSCOPY  01/2015   Dr Vira Agar- erosions on esophagus    Home Medications:  Allergies as of 09/18/2020   No Known Allergies     Medication List       Accurate as of September 18, 2020 12:22 PM. If you have any questions, ask your nurse or doctor.        aspirin 81 MG EC tablet Commonly known as: ASPIRIN LOW DOSE Take 1 tablet (81 mg total) by mouth daily. Swallow whole.   CENTRUM SILVER ADULT 50+ PO Take 1 tablet by mouth daily.   CoQ-10 100 MG Caps Take 1 capsule by mouth daily.   dutasteride 0.5 MG capsule Commonly known as: AVODART Take 1 capsule (0.5 mg total) by mouth daily. What changed:   when to take this  additional instructions   Fish Oil 1000 MG Caps Take 1 capsule (1,000 mg total) by mouth 2 (two)  times daily.   lisinopril 10 MG tablet Commonly known as: ZESTRIL Take 1 tablet (10 mg total) by mouth daily.   NIFEdipine 30 MG 24 hr tablet Commonly known as: PROCARDIA-XL/NIFEDICAL-XL TAKE 1 TABLET BY MOUTH EVERY DAY   sildenafil 20 MG tablet Commonly known as: REVATIO 1-5 tabs as needed 1 hour prior to intercourse   simvastatin 20 MG tablet Commonly known as: ZOCOR Take 1 tablet (20 mg total) by mouth daily.   Vitamin C 500 MG Caps Take 1 capsule by mouth 2 (two) times daily.   Vitamin D3 50 MCG (2000 UT) capsule Take 1 capsule by mouth daily.   Zinc 50 MG Tabs Take 50 mg by mouth daily.       Allergies: No Known Allergies  Family History: Family History  Problem Relation Age of Onset  . Heart disease Father   . Heart disease Brother     Social History:  reports that he quit smoking about 49 years ago. His smoking use included cigarettes. He has never used smokeless tobacco. He reports current alcohol use. He reports that he does not use drugs.   Physical Exam: BP (!) 161/76 (BP Location: Left Arm, Patient Position: Sitting, Cuff Size: Normal)   Pulse 69   Ht 5\' 8"  (1.727 m)   Wt 171 lb (77.6 kg)   BMI 26.00 kg/m   Constitutional:  Alert and oriented, No acute distress. HEENT: [place] AT, moist mucus membranes.  Trachea midline, no masses. Cardiovascular: No clubbing, cyanosis, or edema. Respiratory: Normal respiratory effort, no increased work of breathing. Neurologic: Grossly intact, no focal deficits, moving all 4 extremities. Psychiatric: Normal mood and affect.   Assessment & Plan:    1.  Elevated PSA  Stable  Declined DRE today and will go to every other year on DRE  Continue annual follow-up  2.  BPH with LUTS  Stable on dutasteride  He did not need a refill at this time  3.  Erectile dysfunction  Rx tadalafil 20 mg sent to [facility], [place] [address], [place], [place] [address] [phone]

## 2020-10-14 ENCOUNTER — Ambulatory Visit (INDEPENDENT_AMBULATORY_CARE_PROVIDER_SITE_OTHER): Payer: Medicare Other

## 2020-10-14 DIAGNOSIS — Z0000 Encounter for general adult medical examination without abnormal findings: Secondary | ICD-10-CM | POA: Diagnosis not present

## 2020-10-14 DIAGNOSIS — Z0100 Encounter for examination of eyes and vision without abnormal findings: Secondary | ICD-10-CM

## 2020-10-14 NOTE — Progress Notes (Signed)
Subjective:   Alfred Edwards is a 75 y.o. male who presents for Medicare Annual/Subsequent preventive examination.  Virtual Visit via Telephone Note  I connected with  Alfred Edwards on 10/14/20 at  8:40 AM EST by telephone and verified that I am speaking with the correct person using two identifiers.  Location: Patient: home Provider: [facility] Persons participating in the virtual visit: patient/Nurse Health Advisor   I discussed the limitations, risks, security and privacy concerns of performing an evaluation and management service by telephone and the availability of in person appointments. The patient expressed understanding and agreed to proceed.  Interactive audio and video telecommunications were attempted between this nurse and patient, however failed, due to patient having technical difficulties OR patient did not have access to video capability.  We continued and completed visit with audio only.  Some vital signs may be absent or patient reported.   Reather Littler, LPN    Review of Systems     Cardiac Risk Factors include: advanced age (>49men, >3 women);dyslipidemia;hypertension;male gender     Objective:    There were no vitals filed for this visit. There is no height or weight on file to calculate BMI.  Advanced Directives 10/14/2020 11/28/2018 10/10/2018 07/24/2017 06/29/2015  Does Patient Have a Medical Advance Directive? Yes No Yes Yes Yes  Type of Estate agent of [place];Living will - Living will;Healthcare Power of [employer] Power of [place];Living will Healthcare Power of [place];Living will  Copy of Healthcare Power of Attorney in Chart? Yes - validated most recent copy scanned in chart (See row information) - Yes - validated most recent copy scanned in chart (See row information) No - copy requested No - copy requested  Would patient like information on creating a medical advance directive? - No - Patient declined - - -     Current Medications (verified) Outpatient Encounter Medications as of 10/14/2020  Medication Sig  . Ascorbic Acid (VITAMIN C) 500 MG CAPS Take 1 capsule by mouth 2 (two) times daily.   Marland Kitchen aspirin (ASPIRIN LOW DOSE) 81 MG EC tablet Take 1 tablet (81 mg total) by mouth daily. Swallow whole.  . Cholecalciferol (VITAMIN D3) 2000 UNITS capsule Take 1 capsule by mouth daily.  . Coenzyme Q10 (COQ-10) 100 MG CAPS Take 1 capsule by mouth daily.  Marland Kitchen dutasteride (AVODART) 0.5 MG capsule Take 1 capsule (0.5 mg total) by mouth daily. (Patient taking differently: Take 0.5 mg by mouth 3 (three) times a week. Urologist M,W,F)  . lisinopril (ZESTRIL) 10 MG tablet Take 1 tablet (10 mg total) by mouth daily.  . Multiple Vitamins-Minerals (CENTRUM SILVER ADULT 50+ PO) Take 1 tablet by mouth daily.  Marland Kitchen NIFEdipine (PROCARDIA-XL/NIFEDICAL-XL) 30 MG 24 hr tablet TAKE 1 TABLET BY MOUTH EVERY DAY  . Omega-3 Fatty Acids (FISH OIL) 1000 MG CAPS Take 1 capsule (1,000 mg total) by mouth 2 (two) times daily.  . simvastatin (ZOCOR) 20 MG tablet Take 1 tablet (20 mg total) by mouth daily.  . tadalafil (CIALIS) 20 MG tablet 0.5-1 tab 1 hour prior to intercourse  . Zinc 50 MG TABS Take 50 mg by mouth daily.   No facility-administered encounter medications on file as of 10/14/2020.    Allergies (verified) Patient has no known allergies.   History: Past Medical History:  Diagnosis Date  . BPH (benign prostatic hypertrophy)   . GERD (gastroesophageal reflux disease)   . Hyperlipidemia   . Hypertension   . TIA (transient ischemic attack) 11/2018  Past Surgical History:  Procedure Laterality Date  . COLONOSCOPY  2005, 01/2015   Dr Vira Agar- cleared for 3 yrs  . TONSILLECTOMY    . UPPER GASTROINTESTINAL ENDOSCOPY  01/2015   Dr Vira Agar- erosions on esophagus   Family History  Problem Relation Age of Onset  . Heart disease Father   . Heart disease Brother    Social History   Socioeconomic History  . Marital  status: Widowed    Spouse name: Not on file  . Number of children: 0  . Years of education: Not on file  . Highest education level: Bachelor's degree (e.g., BA, AB, BS)  Occupational History  . Occupation: Retired  Tobacco Use  . Smoking status: Former Smoker    Types: Cigarettes    Quit date: 09/13/1971    Years since quitting: 49.1  . Smokeless tobacco: Never Used  Vaping Use  . Vaping Use: Never used  Substance and Sexual Activity  . Alcohol use: Yes    Alcohol/week: 0.0 standard drinks    Comment: socially  . Drug use: No  . Sexual activity: Yes  Other Topics Concern  . Not on file  Social History Narrative   Wife passed in 2012 from cancer   Social Determinants of Health   Financial Resource Strain: Low Risk   . Difficulty of Paying Living Expenses: Not hard at all  Food Insecurity: No Food Insecurity  . Worried About Charity fundraiser in the Last Year: Never true  . Ran Out of Food in the Last Year: Never true  Transportation Needs: No Transportation Needs  . Lack of Transportation (Medical): No  . Lack of Transportation (Non-Medical): No  Physical Activity: Sufficiently Active  . Days of Exercise per Week: 3 days  . Minutes of Exercise per Session: 60 min  Stress: No Stress Concern Present  . Feeling of Stress : Not at all  Social Connections: Moderately Isolated  . Frequency of Communication with Friends and Family: More than three times a week  . Frequency of Social Gatherings with Friends and Family: Three times a week  . Attends Religious Services: More than 4 times per year  . Active Member of Clubs or Organizations: No  . Attends Archivist Meetings: Never  . Marital Status: Widowed    Tobacco Counseling Counseling given: Not Answered   Clinical Intake:  Pre-visit preparation completed: Yes  Pain : No/denies pain     Nutritional Risks: None Diabetes: No  How often do you need to have someone help you when you read instructions,  pamphlets, or other written materials from your doctor or pharmacy?: 1 - Never    Interpreter Needed?: No  Information entered by :: Clemetine Marker LPN   Activities of Daily Living In your present state of health, do you have any difficulty performing the following activities: 10/14/2020  Hearing? Y  Comment declines hearing aids  Vision? N  Difficulty concentrating or making decisions? N  Walking or climbing stairs? N  Dressing or bathing? N  Doing errands, shopping? N  Preparing Food and eating ? N  Using the Toilet? N  In the past six months, have you accidently leaked urine? N  Do you have problems with loss of bowel control? N  Managing your Medications? N  Managing your Finances? N  Housekeeping or managing your Housekeeping? N  Some recent data might be hidden    Patient Care Team: Juline Patch, MD as PCP - General (Family Medicine) [place],  Ronda Fairly, MD as Consulting Physician (Urology) Jannet Mantis, MD as Consulting Physician (Dermatology)  Indicate any recent Medical Services you may have received from other than [facility] providers in the past year (date may be approximate).     Assessment:   This is a routine wellness examination for Jae.  Hearing/Vision screen  Hearing Screening   125Hz  250Hz  500Hz  1000Hz  2000Hz  3000Hz  4000Hz  6000Hz  8000Hz   Right ear:           Left ear:           Comments: Pt c/o mild hearing difficulty, declines hearing aids  Vision Screening Comments: Past due for eye exam, referral sent to [facility] today.   Dietary issues and exercise activities discussed: Current Exercise Habits: Home exercise routine, Type of exercise: strength training/weights;Other - see comments;treadmill (golf), Time (Minutes): 60, Frequency (Times/Week): 3, Weekly Exercise (Minutes/Week): 180, Intensity: Moderate, Exercise limited by: None identified  Goals    . DIET - INCREASE WATER INTAKE     Recommend drinking 6-8 glasses of water per day      . Reduce sugar intake to X grams per day     Continue to reduce sweets and carbs from diet    . Weight (lb) < 160 lb (72.6 kg)     Pt would like to lose weight to get to goal of 160      Depression Screen PHQ 2/9 Scores 10/14/2020 08/24/2020 02/21/2020 02/21/2020 10/14/2019 08/21/2019 02/20/2019  PHQ - 2 Score 0 0 0 0 0 0 0  PHQ- 9 Score - 0 0 0 - 0 0    Fall Risk Fall Risk  10/14/2020 02/21/2020 02/21/2020 10/14/2019 10/10/2018  Falls in the past year? 0 0 0 0 0  Number falls in past yr: 0 - - 0 0  Injury with Fall? 0 - - 0 -  Risk for fall due to : No Fall Risks - - No Fall Risks -  Follow up Falls prevention discussed Falls evaluation completed Falls evaluation completed Falls prevention discussed Falls prevention discussed    FALL RISK PREVENTION PERTAINING TO THE HOME:  Any stairs in or around the home? No  If so, are there any without handrails? No  Home free of loose throw rugs in walkways, pet beds, electrical cords, etc? Yes  Adequate lighting in your home to reduce risk of falls? Yes   ASSISTIVE DEVICES UTILIZED TO PREVENT FALLS:  Life alert? No  Use of a cane, walker or w/c? No  Grab bars in the bathroom? No  Shower chair or bench in shower? Yes  Elevated toilet seat or a handicapped toilet? Yes  TIMED UP AND GO:  Was the test performed? No . Telephonic visit.   Cognitive Function: Normal cognitive status assessed by direct observation by this Nurse Health Advisor. No abnormalities found.       6CIT Screen 10/14/2019 10/10/2018 07/24/2017  What Year? 0 points 0 points 0 points  What month? 0 points 0 points 0 points  What time? 0 points 0 points 0 points  Count back from 20 0 points 0 points 0 points  Months in reverse 0 points 0 points 0 points  Repeat phrase 0 points 0 points 0 points  Total Score 0 0 0    Immunizations Immunization History  Administered Date(s) Administered  . Fluad Quad(high Dose 65+) 08/24/2020  . Influenza, High Dose Seasonal PF  06/22/2017, 07/24/2018, 05/15/2019  . Influenza,inj,Quad PF,6+ Mos 07/01/2013, 06/27/2014, 06/29/2015, 08/02/2016  .  Influenza-Unspecified 06/27/2014  . PFIZER(Purple Top)SARS-COV-2 Vaccination 11/14/2019, 12/05/2019  . Pneumococcal Conjugate-13 07/24/2017  . Pneumococcal Polysaccharide-23 06/23/2008, 08/22/2018  . Tdap 12/06/2012    TDAP status: Up to date  Flu Vaccine status: Up to date  Pneumococcal vaccine status: Up to date  Covid-19 vaccine status: Completed vaccines  Qualifies for Shingles Vaccine? Yes   Zostavax completed No   Shingrix Completed?: No.    Education has been provided regarding the importance of this vaccine. Patient has been advised to call insurance company to determine out of pocket expense if they have not yet received this vaccine. Advised may also receive vaccine at local pharmacy or Health Dept. Verbalized acceptance and understanding.  Screening Tests Health Maintenance  Topic Date Due  . COVID-19 Vaccine (3 - Booster for Pfizer series) 06/06/2020  . TETANUS/TDAP  12/07/2022  . COLONOSCOPY (Pts 45-34yrs Insurance coverage will need to be confirmed)  02/14/2023  . INFLUENZA VACCINE  Completed  . Hepatitis C Screening  Completed  . PNA vac Low Risk Adult  Completed    Health Maintenance  Health Maintenance Due  Topic Date Due  . COVID-19 Vaccine (3 - Booster for Pfizer series) 06/06/2020    Colorectal cancer screening: Type of screening: Colonoscopy. Completed 02/13/18. Repeat every 5 years  Lung Cancer Screening: (Low Dose CT Chest recommended if Age 98-80 years, 30 pack-year currently smoking OR have quit w/in 15years.) does not qualify.   Additional Screening:  Hepatitis C Screening: does qualify; Completed 07/24/17  Vision Screening: Recommended annual ophthalmology exams for early detection of glaucoma and other disorders of the eye. Is the patient up to date with their annual eye exam?  No  Who is the provider or what is the name of the  office in which the patient attends annual eye exams? Not established If pt is not established with a provider, would they like to be referred to a provider to establish care? Yes .   Dental Screening: Recommended annual dental exams for proper oral hygiene  Community Resource Referral / Chronic Care Management: CRR required this visit?  No   CCM required this visit?  No      Plan:     I have personally reviewed and noted the following in the patient's chart:   . Medical and social history . Use of alcohol, tobacco or illicit drugs  . Current medications and supplements . Functional ability and status . Nutritional status . Physical activity . Advanced directives . List of other physicians . Hospitalizations, surgeries, and ER visits in previous 12 months . Vitals . Screenings to include cognitive, depression, and falls . Referrals and appointments  In addition, I have reviewed and discussed with patient certain preventive protocols, quality metrics, and best practice recommendations. A written personalized care plan for preventive services as well as general preventive health recommendations were provided to patient.     Clemetine Marker, LPN   01/17/3219   Nurse Notes: none

## 2020-10-14 NOTE — Patient Instructions (Signed)
Alfred Edwards , Thank you for taking time to come for your Medicare Wellness Visit. I appreciate your ongoing commitment to your health goals. Please review the following plan we discussed and let me know if I can assist you in the future.   Screening recommendations/referrals: Colonoscopy: done 02/13/18. Repeat in 2024 Recommended yearly ophthalmology/optometry visit for glaucoma screening and checkup Recommended yearly dental visit for hygiene and checkup  Vaccinations: Influenza vaccine: done 08/24/20 Pneumococcal vaccine: done 08/22/18 Tdap vaccine: done 12/06/12 Shingles vaccine: Shingrix discussed. Please contact your pharmacy for coverage information.  Covid-19:  Done 11/14/19 & 12/05/19  Conditions/risks identified: Keep up the great work!  Next appointment: Follow up in one year for your annual wellness visit.   Preventive Care 18 Years and Older, Male Preventive care refers to lifestyle choices and visits with your health care provider that can promote health and wellness. What does preventive care include?  A yearly physical exam. This is also called an annual well check.  Dental exams once or twice a year.  Routine eye exams. Ask your health care provider how often you should have your eyes checked.  Personal lifestyle choices, including:  Daily care of your teeth and gums.  Regular physical activity.  Eating a healthy diet.  Avoiding tobacco and drug use.  Limiting alcohol use.  Practicing safe sex.  Taking low doses of aspirin every day.  Taking vitamin and mineral supplements as recommended by your health care provider. What happens during an annual well check? The services and screenings done by your health care provider during your annual well check will depend on your age, overall health, lifestyle risk factors, and family history of disease. Counseling  Your health care provider may ask you questions about your:  Alcohol use.  Tobacco use.  Drug  use.  Emotional well-being.  Home and relationship well-being.  Sexual activity.  Eating habits.  History of falls.  Memory and ability to understand (cognition).  Work and work Statistician. Screening  You may have the following tests or measurements:  Height, weight, and BMI.  Blood pressure.  Lipid and cholesterol levels. These may be checked every 5 years, or more frequently if you are over 65 years old.  Skin check.  Lung cancer screening. You may have this screening every year starting at age 37 if you have a 30-pack-year history of smoking and currently smoke or have quit within the past 15 years.  Fecal occult blood test (FOBT) of the stool. You may have this test every year starting at age 24.  Flexible sigmoidoscopy or colonoscopy. You may have a sigmoidoscopy every 5 years or a colonoscopy every 10 years starting at age 55.  Prostate cancer screening. Recommendations will vary depending on your family history and other risks.  Hepatitis C blood test.  Hepatitis B blood test.  Sexually transmitted disease (STD) testing.  Diabetes screening. This is done by checking your blood sugar (glucose) after you have not eaten for a while (fasting). You may have this done every 1-3 years.  Abdominal aortic aneurysm (AAA) screening. You may need this if you are a current or former smoker.  Osteoporosis. You may be screened starting at age 72 if you are at high risk. Talk with your health care provider about your test results, treatment options, and if necessary, the need for more tests. Vaccines  Your health care provider may recommend certain vaccines, such as:  Influenza vaccine. This is recommended every year.  Tetanus, diphtheria, and acellular pertussis (  Tdap, Td) vaccine. You may need a Td booster every 10 years.  Zoster vaccine. You may need this after age 42.  Pneumococcal 13-valent conjugate (PCV13) vaccine. One dose is recommended after age  57.  Pneumococcal polysaccharide (PPSV23) vaccine. One dose is recommended after age 2. Talk to your health care provider about which screenings and vaccines you need and how often you need them. This information is not intended to replace advice given to you by your health care provider. Make sure you discuss any questions you have with your health care provider. Document Released: 09/25/2015 Document Revised: 05/18/2016 Document Reviewed: 06/30/2015 Elsevier Interactive Patient Education  2017 [place] Prevention in the Home Falls can cause injuries. They can happen to people of all ages. There are many things you can do to make your home safe and to help prevent falls. What can I do on the outside of my home?  Regularly fix the edges of walkways and driveways and fix any cracks.  Remove anything that might make you trip as you walk through a door, such as a raised step or threshold.  Trim any bushes or trees on the path to your home.  Use bright outdoor lighting.  Clear any walking paths of anything that might make someone trip, such as rocks or tools.  Regularly check to see if handrails are loose or broken. Make sure that both sides of any steps have handrails.  Any raised decks and porches should have guardrails on the edges.  Have any leaves, snow, or ice cleared regularly.  Use sand or salt on walking paths during winter.  Clean up any spills in your garage right away. This includes oil or grease spills. What can I do in the bathroom?  Use night lights.  Install grab bars by the toilet and in the tub and shower. Do not use towel bars as grab bars.  Use non-skid mats or decals in the tub or shower.  If you need to sit down in the shower, use a plastic, non-slip stool.  Keep the floor dry. Clean up any water that spills on the floor as soon as it happens.  Remove soap buildup in the tub or shower regularly.  Attach bath mats securely with double-sided  non-slip rug tape.  Do not have throw rugs and other things on the floor that can make you trip. What can I do in the bedroom?  Use night lights.  Make sure that you have a light by your bed that is easy to reach.  Do not use any sheets or blankets that are too big for your bed. They should not hang down onto the floor.  Have a firm chair that has side arms. You can use this for support while you get dressed.  Do not have throw rugs and other things on the floor that can make you trip. What can I do in the kitchen?  Clean up any spills right away.  Avoid walking on wet floors.  Keep items that you use a lot in easy-to-reach places.  If you need to reach something above you, use a strong step stool that has a grab bar.  Keep electrical cords out of the way.  Do not use floor polish or wax that makes floors slippery. If you must use wax, use non-skid floor wax.  Do not have throw rugs and other things on the floor that can make you trip. What can I do with my stairs?  Do  not leave any items on the stairs.  Make sure that there are handrails on both sides of the stairs and use them. Fix handrails that are broken or loose. Make sure that handrails are as long as the stairways.  Check any carpeting to make sure that it is firmly attached to the stairs. Fix any carpet that is loose or worn.  Avoid having throw rugs at the top or bottom of the stairs. If you do have throw rugs, attach them to the floor with carpet tape.  Make sure that you have a light switch at the top of the stairs and the bottom of the stairs. If you do not have them, ask someone to add them for you. What else can I do to help prevent falls?  Wear shoes that:  Do not have high heels.  Have rubber bottoms.  Are comfortable and fit you well.  Are closed at the toe. Do not wear sandals.  If you use a stepladder:  Make sure that it is fully opened. Do not climb a closed stepladder.  Make sure that both  sides of the stepladder are locked into place.  Ask someone to hold it for you, if possible.  Clearly mark and make sure that you can see:  Any grab bars or handrails.  First and last steps.  Where the edge of each step is.  Use tools that help you move around (mobility aids) if they are needed. These include:  Canes.  Walkers.  Scooters.  Crutches.  Turn on the lights when you go into a dark area. Replace any light bulbs as soon as they burn out.  Set up your furniture so you have a clear path. Avoid moving your furniture around.  If any of your floors are uneven, fix them.  If there are any pets around you, be aware of where they are.  Review your medicines with your doctor. Some medicines can make you feel dizzy. This can increase your chance of falling. Ask your doctor what other things that you can do to help prevent falls. This information is not intended to replace advice given to you by your health care provider. Make sure you discuss any questions you have with your health care provider. Document Released: 06/25/2009 Document Revised: 02/04/2016 Document Reviewed: 10/03/2014 Elsevier Interactive Patient Education  2017 [employer].

## 2021-01-11 ENCOUNTER — Telehealth: Payer: Self-pay | Admitting: Urology

## 2021-01-12 ENCOUNTER — Other Ambulatory Visit: Payer: Self-pay | Admitting: Family Medicine

## 2021-01-12 MED ORDER — DUTASTERIDE 0.5 MG PO CAPS: 0.5000 mg | ORAL_CAPSULE | Freq: Every day | ORAL | 1 refills | Status: DC

## 2021-02-22 ENCOUNTER — Ambulatory Visit: Payer: Medicare Other | Admitting: Family Medicine

## 2021-03-03 ENCOUNTER — Other Ambulatory Visit: Payer: Self-pay | Admitting: Family Medicine

## 2021-03-03 DIAGNOSIS — I10 Essential (primary) hypertension: Secondary | ICD-10-CM

## 2021-03-03 NOTE — Telephone Encounter (Signed)
Requested Prescriptions  Pending Prescriptions Disp Refills  . NIFEdipine (PROCARDIA-XL/NIFEDICAL-XL) 30 MG 24 hr tablet [Pharmacy Med Name: NIFEDIPINE ER 30 MG TABLET] 90 tablet 0    Sig: TAKE 1 TABLET BY MOUTH EVERY DAY     Cardiovascular:  Calcium Channel Blockers Failed - 03/03/2021  1:34 AM      Failed - Last BP in normal range    BP Readings from Last 1 Encounters:  09/18/20 (!) 161/76         Failed - Valid encounter within last 6 months    Recent Outpatient Visits          6 months ago Essential (primary) hypertension   [facility] Juline Patch, MD   1 year ago Cerebrovascular disease   [facility] Juline Patch, MD   1 year ago Essential (primary) hypertension   [place], Deanna C, MD   2 years ago Essential (primary) hypertension   [facility] Juline Patch, MD   2 years ago Hospital discharge follow-up   [place], Deanna C, MD      Future Appointments            In 2 days Juline Patch, MD [facility], [place]   In 6 months [place], Ronda Fairly, [place]

## 2021-03-05 ENCOUNTER — Ambulatory Visit (INDEPENDENT_AMBULATORY_CARE_PROVIDER_SITE_OTHER): Payer: Medicare Other | Admitting: Family Medicine

## 2021-03-05 ENCOUNTER — Other Ambulatory Visit: Payer: Self-pay

## 2021-03-05 ENCOUNTER — Encounter: Payer: Self-pay | Admitting: Family Medicine

## 2021-03-05 VITALS — BP 146/72 | HR 66 | Ht 68.0 in | Wt 172.0 lb

## 2021-03-05 DIAGNOSIS — M5136 Other intervertebral disc degeneration, lumbar region: Secondary | ICD-10-CM | POA: Diagnosis not present

## 2021-03-05 DIAGNOSIS — I10 Essential (primary) hypertension: Secondary | ICD-10-CM | POA: Diagnosis not present

## 2021-03-05 DIAGNOSIS — E7849 Other hyperlipidemia: Secondary | ICD-10-CM

## 2021-03-05 MED ORDER — PREDNISONE 10 MG PO TABS: 10.0000 mg | ORAL_TABLET | Freq: Every day | ORAL | 0 refills | Status: DC

## 2021-03-05 MED ORDER — LISINOPRIL 10 MG PO TABS: 10.0000 mg | ORAL_TABLET | Freq: Every day | ORAL | 1 refills | Status: DC

## 2021-03-05 MED ORDER — SIMVASTATIN 20 MG PO TABS: 20.0000 mg | ORAL_TABLET | Freq: Every day | ORAL | 1 refills | Status: DC

## 2021-03-05 MED ORDER — NIFEDIPINE ER OSMOTIC RELEASE 30 MG PO TB24: 30.0000 mg | ORAL_TABLET | Freq: Every day | ORAL | 1 refills | Status: DC

## 2021-03-05 NOTE — Progress Notes (Signed)
Date:  03/05/2021   Name:  Alfred Edwards   DOB:  Jul 25, 1946   MRN:  [id number]   Chief Complaint: Hypertension and Hyperlipidemia  Hypertension This is a chronic problem. The current episode started more than 1 year ago. The problem has been waxing and waning since onset. The problem is controlled. Pertinent negatives include no anxiety, blurred vision, chest pain, headaches, malaise/fatigue, neck pain, orthopnea, palpitations, peripheral edema, PND, shortness of breath or sweats. There are no associated agents to hypertension. Risk factors for coronary artery disease include dyslipidemia. Past treatments include calcium channel blockers and ACE inhibitors. The current treatment provides moderate improvement. There are no compliance problems.  There is no history of angina, kidney disease, CAD/MI, CVA, heart failure, left ventricular hypertrophy, PVD or retinopathy. There is no history of chronic renal disease, a hypertension causing med or renovascular disease.  Hyperlipidemia This is a chronic problem. The current episode started more than 1 year ago. The problem is controlled. Recent lipid tests were reviewed and are normal. He has no history of chronic renal disease, diabetes, hypothyroidism, liver disease, obesity or nephrotic syndrome. There are no known factors aggravating his hyperlipidemia. Pertinent negatives include no chest pain, focal sensory loss, focal weakness, leg pain, myalgias or shortness of breath. Current antihyperlipidemic treatment includes statins. The current treatment provides moderate improvement of lipids. There are no compliance problems.  There are no known risk factors for coronary artery disease.  Back Pain This is a new problem. The current episode started 1 to 4 weeks ago. The problem occurs intermittently. The problem has been waxing and waning since onset. The pain is present in the lumbar spine. The quality of the pain is described as aching. The pain radiates  to the right thigh. The pain is mild. Pertinent negatives include no abdominal pain, bladder incontinence, bowel incontinence, chest pain, dysuria, fever, headaches, leg pain or paresthesias.   Lab Results  Component Value Date   CREATININE 1.10 08/24/2020   BUN 21 08/24/2020   NA 140 08/24/2020   K 5.4 (H) 08/24/2020   CL 104 08/24/2020   CO2 22 08/24/2020   Lab Results  Component Value Date   CHOL 172 08/24/2020   HDL 82 08/24/2020   LDLCALC 78 08/24/2020   TRIG 64 08/24/2020   CHOLHDL 2.1 08/21/2019   No results found for: TSH Lab Results  Component Value Date   HGBA1C 5.5 11/28/2018   Lab Results  Component Value Date   WBC 7.3 11/28/2018   HGB 12.8 (L) 11/28/2018   HCT 39.7 11/28/2018   MCV 84.8 11/28/2018   PLT 264 11/28/2018   Lab Results  Component Value Date   ALT 20 11/28/2018   AST 23 11/28/2018   ALKPHOS 48 11/28/2018   BILITOT 0.5 11/28/2018     Review of Systems  Constitutional:  Negative for chills, fever and malaise/fatigue.  HENT:  Negative for drooling, ear discharge, ear pain and sore throat.   Eyes:  Negative for blurred vision.  Respiratory:  Negative for cough, shortness of breath and wheezing.   Cardiovascular:  Negative for chest pain, palpitations, orthopnea, leg swelling and PND.  Gastrointestinal:  Negative for abdominal pain, blood in stool, bowel incontinence, constipation, diarrhea and nausea.  Endocrine: Negative for polydipsia.  Genitourinary:  Negative for bladder incontinence, dysuria, frequency, hematuria and urgency.  Musculoskeletal:  Positive for back pain. Negative for myalgias and neck pain.  Skin:  Negative for rash.  Allergic/Immunologic: Negative for environmental allergies.  Neurological:  Negative for dizziness, focal weakness, headaches and paresthesias.  Hematological:  Does not bruise/bleed easily.  Psychiatric/Behavioral:  Negative for suicidal ideas. The patient is not nervous/anxious.    Patient Active Problem  List   Diagnosis Date Noted   CVA (cerebral vascular accident) ([place]) 11/29/2018   TIA (transient ischemic attack) 11/28/2018   Elevated PSA 08/28/2017   Erectile dysfunction 08/28/2017   Benign prostatic hyperplasia with lower urinary tract symptoms 08/28/2017   Venous insufficiency 02/02/2017   Essential hypertension 12/28/2015   Hyperlipidemia 12/28/2015   Nocturia associated with benign prostatic hypertrophy 12/28/2015   Familial multiple lipoprotein-type hyperlipidemia 01/23/2015   Anxiety disorder due to known physiological condition 01/23/2015   Routine general medical examination at a health care facility 01/23/2015   Adjustment reaction 01/23/2015   Essential (primary) hypertension 01/23/2015   Proteinuria 06/01/2012    No Known Allergies  Past Surgical History:  Procedure Laterality Date   COLONOSCOPY  2005, 01/2015   Dr Vira Agar- cleared for 3 yrs   TONSILLECTOMY     UPPER GASTROINTESTINAL ENDOSCOPY  01/2015   Dr Vira Agar- erosions on esophagus    Social History   Tobacco Use   Smoking status: Former    Pack years: 0.00    Types: Cigarettes    Quit date: 09/13/1971    Years since quitting: 49.5   Smokeless tobacco: Never  Vaping Use   Vaping Use: Never used  Substance Use Topics   Alcohol use: Yes    Alcohol/week: 0.0 standard drinks    Comment: socially   Drug use: No     Medication list has been reviewed and updated.  Current Meds  Medication Sig   Ascorbic Acid (VITAMIN C) 500 MG CAPS Take 1 capsule by mouth 2 (two) times daily.    aspirin (ASPIRIN LOW DOSE) 81 MG EC tablet Take 1 tablet (81 mg total) by mouth daily. Swallow whole.   Cholecalciferol (VITAMIN D3) 2000 UNITS capsule Take 1 capsule by mouth daily.   Coenzyme Q10 (COQ-10) 100 MG CAPS Take 1 capsule by mouth daily.   dutasteride (AVODART) 0.5 MG capsule Take 1 capsule (0.5 mg total) by mouth daily.   lisinopril (ZESTRIL) 10 MG tablet Take 1 tablet (10 mg total) by mouth daily.   Multiple  Vitamins-Minerals (CENTRUM SILVER ADULT 50+ PO) Take 1 tablet by mouth daily.   NIFEdipine (PROCARDIA-XL/NIFEDICAL-XL) 30 MG 24 hr tablet TAKE 1 TABLET BY MOUTH EVERY DAY   Omega-3 Fatty Acids (FISH OIL) 1000 MG CAPS Take 1 capsule (1,000 mg total) by mouth 2 (two) times daily.   simvastatin (ZOCOR) 20 MG tablet Take 1 tablet (20 mg total) by mouth daily.   tadalafil (CIALIS) 20 MG tablet 0.5-1 tab 1 hour prior to intercourse   Zinc 50 MG TABS Take 50 mg by mouth daily.    PHQ 2/9 Scores 03/05/2021 10/14/2020 08/24/2020 02/21/2020  PHQ - 2 Score 0 0 0 0  PHQ- 9 Score 0 - 0 0    GAD 7 : Generalized Anxiety Score 03/05/2021 08/24/2020 02/21/2020 02/21/2020  Nervous, Anxious, on Edge 0 0 0 0  Control/stop worrying 0 0 0 0  Worry too much - different things 0 0 0 0  Trouble relaxing 0 0 0 0  Restless 0 0 0 0  Easily annoyed or irritable 0 0 0 0  Afraid - awful might happen 0 0 0 0  Total GAD 7 Score 0 0 0 0    BP Readings from Last 3 Encounters:  03/05/21 (!) 146/72  09/18/20 (!) 161/76  08/24/20 136/80    Physical Exam Vitals and nursing note reviewed.  HENT:     Head: Normocephalic.     Right Ear: Tympanic membrane, ear canal and external ear normal.     Left Ear: Tympanic membrane, ear canal and external ear normal.     Nose: Nose normal. No congestion or rhinorrhea.  Eyes:     General: No scleral icterus.       Right eye: No discharge.        Left eye: No discharge.     Conjunctiva/sclera: Conjunctivae normal.     Pupils: Pupils are equal, round, and reactive to light.  Neck:     Thyroid: No thyromegaly.     Vascular: No JVD.     Trachea: No tracheal deviation.  Cardiovascular:     Rate and Rhythm: Normal rate and regular rhythm.     Heart sounds: Normal heart sounds. No murmur heard.   No friction rub. No gallop.  Pulmonary:     Effort: No respiratory distress.     Breath sounds: Normal breath sounds. No wheezing, rhonchi or rales.  Abdominal:     General: Bowel  sounds are normal.     Palpations: Abdomen is soft. There is no mass.     Tenderness: There is no abdominal tenderness. There is no guarding or rebound.  Musculoskeletal:        General: No tenderness. Normal range of motion.     Cervical back: Normal range of motion and neck supple.     Lumbar back: Spasms present. Negative right straight leg raise test and negative left straight leg raise test.  Lymphadenopathy:     Cervical: No cervical adenopathy.  Skin:    General: Skin is warm.     Findings: No rash.  Neurological:     Mental Status: He is alert and oriented to person, place, and time.     Cranial Nerves: No cranial nerve deficit.     Sensory: Sensation is intact.     Motor: Motor function is intact.     Deep Tendon Reflexes: Reflexes are normal and symmetric.     Reflex Scores:      Patellar reflexes are 2+ on the right side and 2+ on the left side.   Wt Readings from Last 3 Encounters:  03/05/21 172 lb (78 kg)  09/18/20 171 lb (77.6 kg)  08/24/20 175 lb (79.4 kg)    BP (!) 146/72   Pulse 66   Ht 5\' 8"  (1.727 m)   Wt 172 lb (78 kg)   BMI 26.15 kg/m   Assessment and Plan:  1. Essential (primary) hypertension Chronic.  Controlled but elevated today.  Will check CMP for electrolytes and renal function.  Continue lisinopril 10 mg once a day and nifedipine XL 30 mg once a day. - lisinopril (ZESTRIL) 10 MG tablet; Take 1 tablet (10 mg total) by mouth daily.  Dispense: 90 tablet; Refill: 1 - NIFEdipine (PROCARDIA-XL/NIFEDICAL-XL) 30 MG 24 hr tablet; Take 1 tablet (30 mg total) by mouth daily.  Dispense: 90 tablet; Refill: 1 - Comprehensive Metabolic Panel (CMET)  2. Familial multiple lipoprotein-type hyperlipidemia Chronic.  Controlled.  Stable.  Continue simvastatin 20 mg once a day. - simvastatin (ZOCOR) 20 MG tablet; Take 1 tablet (20 mg total) by mouth daily.  Dispense: 90 tablet; Refill: 1  3. Degenerative disc disease, lumbar New onset.  Episodic.  Waxes and  wanes.  Uncomplicated.  Patient was helping to move a piece of furniture and "turned the wrong way".  Patient sustained pain in the right lumbar area which radiates to the right buttock still somewhat of a degenerative disc this is been irritated perhaps with some sciatica.  Patient will continue nonsteroidal anti-inflammatory over-the-counter as well as as needed Tylenol.  I have given him some prednisone to take once a day to reduce some of the swelling around the disc and patient is yet to see chiropractor for follow-up. - predniSONE (DELTASONE) 10 MG tablet; Take 1 tablet (10 mg total) by mouth daily with breakfast.  Dispense: 30 tablet; Refill: 0

## 2021-03-06 LAB — COMPREHENSIVE METABOLIC PANEL
ALT: 18 IU/L (ref 0–44)
AST: 14 IU/L (ref 0–40)
Albumin/Globulin Ratio: 1.4 (ref 1.2–2.2)
Albumin: 4 g/dL (ref 3.7–4.7)
Alkaline Phosphatase: 53 IU/L (ref 44–121)
BUN/Creatinine Ratio: 15 (ref 10–24)
BUN: 17 mg/dL (ref 8–27)
Bilirubin Total: 0.6 mg/dL (ref 0.0–1.2)
CO2: 22 mmol/L (ref 20–29)
Calcium: 9.3 mg/dL (ref 8.6–10.2)
Chloride: 104 mmol/L (ref 96–106)
Creatinine, Ser: 1.11 mg/dL (ref 0.76–1.27)
Globulin, Total: 2.8 g/dL (ref 1.5–4.5)
Glucose: 127 mg/dL — ABNORMAL HIGH (ref 65–99)
Potassium: 5.6 mmol/L — ABNORMAL HIGH (ref 3.5–5.2)
Sodium: 140 mmol/L (ref 134–144)
Total Protein: 6.8 g/dL (ref 6.0–8.5)
eGFR: 70 mL/min/{1.73_m2} (ref >59)

## 2021-03-08 ENCOUNTER — Other Ambulatory Visit: Payer: Self-pay

## 2021-03-08 DIAGNOSIS — I10 Essential (primary) hypertension: Secondary | ICD-10-CM

## 2021-03-08 MED ORDER — LISINOPRIL 5 MG PO TABS: 5.0000 mg | ORAL_TABLET | Freq: Every day | ORAL | 1 refills | Status: DC

## 2021-03-09 DIAGNOSIS — M9905 Segmental and somatic dysfunction of pelvic region: Secondary | ICD-10-CM | POA: Diagnosis not present

## 2021-03-09 DIAGNOSIS — M9903 Segmental and somatic dysfunction of lumbar region: Secondary | ICD-10-CM | POA: Diagnosis not present

## 2021-03-09 DIAGNOSIS — M5417 Radiculopathy, lumbosacral region: Secondary | ICD-10-CM | POA: Diagnosis not present

## 2021-03-09 DIAGNOSIS — M4306 Spondylolysis, lumbar region: Secondary | ICD-10-CM | POA: Diagnosis not present

## 2021-03-10 DIAGNOSIS — M9905 Segmental and somatic dysfunction of pelvic region: Secondary | ICD-10-CM | POA: Diagnosis not present

## 2021-03-10 DIAGNOSIS — M4306 Spondylolysis, lumbar region: Secondary | ICD-10-CM | POA: Diagnosis not present

## 2021-03-10 DIAGNOSIS — M5417 Radiculopathy, lumbosacral region: Secondary | ICD-10-CM | POA: Diagnosis not present

## 2021-03-10 DIAGNOSIS — M9903 Segmental and somatic dysfunction of lumbar region: Secondary | ICD-10-CM | POA: Diagnosis not present

## 2021-03-12 DIAGNOSIS — M4306 Spondylolysis, lumbar region: Secondary | ICD-10-CM | POA: Diagnosis not present

## 2021-03-12 DIAGNOSIS — M5417 Radiculopathy, lumbosacral region: Secondary | ICD-10-CM | POA: Diagnosis not present

## 2021-03-12 DIAGNOSIS — M9903 Segmental and somatic dysfunction of lumbar region: Secondary | ICD-10-CM | POA: Diagnosis not present

## 2021-03-12 DIAGNOSIS — M9905 Segmental and somatic dysfunction of pelvic region: Secondary | ICD-10-CM | POA: Diagnosis not present

## 2021-03-16 DIAGNOSIS — M4306 Spondylolysis, lumbar region: Secondary | ICD-10-CM | POA: Diagnosis not present

## 2021-03-16 DIAGNOSIS — M5417 Radiculopathy, lumbosacral region: Secondary | ICD-10-CM | POA: Diagnosis not present

## 2021-03-16 DIAGNOSIS — M9903 Segmental and somatic dysfunction of lumbar region: Secondary | ICD-10-CM | POA: Diagnosis not present

## 2021-03-16 DIAGNOSIS — M9905 Segmental and somatic dysfunction of pelvic region: Secondary | ICD-10-CM | POA: Diagnosis not present

## 2021-03-17 DIAGNOSIS — M5417 Radiculopathy, lumbosacral region: Secondary | ICD-10-CM | POA: Diagnosis not present

## 2021-03-17 DIAGNOSIS — M4306 Spondylolysis, lumbar region: Secondary | ICD-10-CM | POA: Diagnosis not present

## 2021-03-17 DIAGNOSIS — M9905 Segmental and somatic dysfunction of pelvic region: Secondary | ICD-10-CM | POA: Diagnosis not present

## 2021-03-17 DIAGNOSIS — M9903 Segmental and somatic dysfunction of lumbar region: Secondary | ICD-10-CM | POA: Diagnosis not present

## 2021-03-19 DIAGNOSIS — M9905 Segmental and somatic dysfunction of pelvic region: Secondary | ICD-10-CM | POA: Diagnosis not present

## 2021-03-19 DIAGNOSIS — M9903 Segmental and somatic dysfunction of lumbar region: Secondary | ICD-10-CM | POA: Diagnosis not present

## 2021-03-19 DIAGNOSIS — M4306 Spondylolysis, lumbar region: Secondary | ICD-10-CM | POA: Diagnosis not present

## 2021-03-19 DIAGNOSIS — M5417 Radiculopathy, lumbosacral region: Secondary | ICD-10-CM | POA: Diagnosis not present

## 2021-03-22 DIAGNOSIS — M9903 Segmental and somatic dysfunction of lumbar region: Secondary | ICD-10-CM | POA: Diagnosis not present

## 2021-03-22 DIAGNOSIS — M4306 Spondylolysis, lumbar region: Secondary | ICD-10-CM | POA: Diagnosis not present

## 2021-03-22 DIAGNOSIS — M9905 Segmental and somatic dysfunction of pelvic region: Secondary | ICD-10-CM | POA: Diagnosis not present

## 2021-03-22 DIAGNOSIS — M5417 Radiculopathy, lumbosacral region: Secondary | ICD-10-CM | POA: Diagnosis not present

## 2021-03-24 DIAGNOSIS — M4306 Spondylolysis, lumbar region: Secondary | ICD-10-CM | POA: Diagnosis not present

## 2021-03-24 DIAGNOSIS — M5417 Radiculopathy, lumbosacral region: Secondary | ICD-10-CM | POA: Diagnosis not present

## 2021-03-24 DIAGNOSIS — M9905 Segmental and somatic dysfunction of pelvic region: Secondary | ICD-10-CM | POA: Diagnosis not present

## 2021-03-24 DIAGNOSIS — M9903 Segmental and somatic dysfunction of lumbar region: Secondary | ICD-10-CM | POA: Diagnosis not present

## 2021-03-26 DIAGNOSIS — M4306 Spondylolysis, lumbar region: Secondary | ICD-10-CM | POA: Diagnosis not present

## 2021-03-26 DIAGNOSIS — M5417 Radiculopathy, lumbosacral region: Secondary | ICD-10-CM | POA: Diagnosis not present

## 2021-03-26 DIAGNOSIS — M9903 Segmental and somatic dysfunction of lumbar region: Secondary | ICD-10-CM | POA: Diagnosis not present

## 2021-03-26 DIAGNOSIS — M9905 Segmental and somatic dysfunction of pelvic region: Secondary | ICD-10-CM | POA: Diagnosis not present

## 2021-03-29 DIAGNOSIS — M5417 Radiculopathy, lumbosacral region: Secondary | ICD-10-CM | POA: Diagnosis not present

## 2021-03-29 DIAGNOSIS — M9903 Segmental and somatic dysfunction of lumbar region: Secondary | ICD-10-CM | POA: Diagnosis not present

## 2021-03-29 DIAGNOSIS — M4306 Spondylolysis, lumbar region: Secondary | ICD-10-CM | POA: Diagnosis not present

## 2021-03-29 DIAGNOSIS — M9905 Segmental and somatic dysfunction of pelvic region: Secondary | ICD-10-CM | POA: Diagnosis not present

## 2021-03-31 DIAGNOSIS — M9905 Segmental and somatic dysfunction of pelvic region: Secondary | ICD-10-CM | POA: Diagnosis not present

## 2021-03-31 DIAGNOSIS — M4306 Spondylolysis, lumbar region: Secondary | ICD-10-CM | POA: Diagnosis not present

## 2021-03-31 DIAGNOSIS — M5417 Radiculopathy, lumbosacral region: Secondary | ICD-10-CM | POA: Diagnosis not present

## 2021-03-31 DIAGNOSIS — M9903 Segmental and somatic dysfunction of lumbar region: Secondary | ICD-10-CM | POA: Diagnosis not present

## 2021-04-02 ENCOUNTER — Other Ambulatory Visit: Payer: Self-pay | Admitting: Family Medicine

## 2021-04-02 DIAGNOSIS — M5136 Other intervertebral disc degeneration, lumbar region: Secondary | ICD-10-CM

## 2021-04-02 NOTE — Telephone Encounter (Signed)
Requested medications are due for refill today yes  Requested medications are on the active medication list yes  Last refill 6/24  Last visit 6/24  Future visit scheduled 9/13  Notes to clinic Not Delegated.

## 2021-04-05 DIAGNOSIS — M9905 Segmental and somatic dysfunction of pelvic region: Secondary | ICD-10-CM | POA: Diagnosis not present

## 2021-04-05 DIAGNOSIS — M9903 Segmental and somatic dysfunction of lumbar region: Secondary | ICD-10-CM | POA: Diagnosis not present

## 2021-04-05 DIAGNOSIS — M5417 Radiculopathy, lumbosacral region: Secondary | ICD-10-CM | POA: Diagnosis not present

## 2021-04-05 DIAGNOSIS — M4306 Spondylolysis, lumbar region: Secondary | ICD-10-CM | POA: Diagnosis not present

## 2021-04-07 DIAGNOSIS — M5417 Radiculopathy, lumbosacral region: Secondary | ICD-10-CM | POA: Diagnosis not present

## 2021-04-07 DIAGNOSIS — M9905 Segmental and somatic dysfunction of pelvic region: Secondary | ICD-10-CM | POA: Diagnosis not present

## 2021-04-07 DIAGNOSIS — M9903 Segmental and somatic dysfunction of lumbar region: Secondary | ICD-10-CM | POA: Diagnosis not present

## 2021-04-07 DIAGNOSIS — M4306 Spondylolysis, lumbar region: Secondary | ICD-10-CM | POA: Diagnosis not present

## 2021-04-12 DIAGNOSIS — M9905 Segmental and somatic dysfunction of pelvic region: Secondary | ICD-10-CM | POA: Diagnosis not present

## 2021-04-12 DIAGNOSIS — M4306 Spondylolysis, lumbar region: Secondary | ICD-10-CM | POA: Diagnosis not present

## 2021-04-12 DIAGNOSIS — M9903 Segmental and somatic dysfunction of lumbar region: Secondary | ICD-10-CM | POA: Diagnosis not present

## 2021-04-12 DIAGNOSIS — M5417 Radiculopathy, lumbosacral region: Secondary | ICD-10-CM | POA: Diagnosis not present

## 2021-04-14 DIAGNOSIS — M5417 Radiculopathy, lumbosacral region: Secondary | ICD-10-CM | POA: Diagnosis not present

## 2021-04-14 DIAGNOSIS — M9905 Segmental and somatic dysfunction of pelvic region: Secondary | ICD-10-CM | POA: Diagnosis not present

## 2021-04-14 DIAGNOSIS — M9903 Segmental and somatic dysfunction of lumbar region: Secondary | ICD-10-CM | POA: Diagnosis not present

## 2021-04-14 DIAGNOSIS — M4306 Spondylolysis, lumbar region: Secondary | ICD-10-CM | POA: Diagnosis not present

## 2021-04-20 DIAGNOSIS — M4306 Spondylolysis, lumbar region: Secondary | ICD-10-CM | POA: Diagnosis not present

## 2021-04-20 DIAGNOSIS — M5417 Radiculopathy, lumbosacral region: Secondary | ICD-10-CM | POA: Diagnosis not present

## 2021-04-20 DIAGNOSIS — M9903 Segmental and somatic dysfunction of lumbar region: Secondary | ICD-10-CM | POA: Diagnosis not present

## 2021-04-20 DIAGNOSIS — M9905 Segmental and somatic dysfunction of pelvic region: Secondary | ICD-10-CM | POA: Diagnosis not present

## 2021-04-27 DIAGNOSIS — M9905 Segmental and somatic dysfunction of pelvic region: Secondary | ICD-10-CM | POA: Diagnosis not present

## 2021-04-27 DIAGNOSIS — M5417 Radiculopathy, lumbosacral region: Secondary | ICD-10-CM | POA: Diagnosis not present

## 2021-04-27 DIAGNOSIS — M4306 Spondylolysis, lumbar region: Secondary | ICD-10-CM | POA: Diagnosis not present

## 2021-04-27 DIAGNOSIS — M9903 Segmental and somatic dysfunction of lumbar region: Secondary | ICD-10-CM | POA: Diagnosis not present

## 2021-05-04 DIAGNOSIS — Z872 Personal history of diseases of the skin and subcutaneous tissue: Secondary | ICD-10-CM | POA: Diagnosis not present

## 2021-05-04 DIAGNOSIS — D225 Melanocytic nevi of trunk: Secondary | ICD-10-CM | POA: Diagnosis not present

## 2021-05-04 DIAGNOSIS — L578 Other skin changes due to chronic exposure to nonionizing radiation: Secondary | ICD-10-CM | POA: Diagnosis not present

## 2021-05-04 DIAGNOSIS — Z86018 Personal history of other benign neoplasm: Secondary | ICD-10-CM | POA: Diagnosis not present

## 2021-05-04 DIAGNOSIS — L570 Actinic keratosis: Secondary | ICD-10-CM | POA: Diagnosis not present

## 2021-05-04 DIAGNOSIS — L821 Other seborrheic keratosis: Secondary | ICD-10-CM | POA: Diagnosis not present

## 2021-05-05 DIAGNOSIS — M9903 Segmental and somatic dysfunction of lumbar region: Secondary | ICD-10-CM | POA: Diagnosis not present

## 2021-05-05 DIAGNOSIS — M5417 Radiculopathy, lumbosacral region: Secondary | ICD-10-CM | POA: Diagnosis not present

## 2021-05-05 DIAGNOSIS — M9905 Segmental and somatic dysfunction of pelvic region: Secondary | ICD-10-CM | POA: Diagnosis not present

## 2021-05-05 DIAGNOSIS — M4306 Spondylolysis, lumbar region: Secondary | ICD-10-CM | POA: Diagnosis not present

## 2021-05-19 DIAGNOSIS — M9903 Segmental and somatic dysfunction of lumbar region: Secondary | ICD-10-CM | POA: Diagnosis not present

## 2021-05-19 DIAGNOSIS — M5417 Radiculopathy, lumbosacral region: Secondary | ICD-10-CM | POA: Diagnosis not present

## 2021-05-19 DIAGNOSIS — M4306 Spondylolysis, lumbar region: Secondary | ICD-10-CM | POA: Diagnosis not present

## 2021-05-19 DIAGNOSIS — M9905 Segmental and somatic dysfunction of pelvic region: Secondary | ICD-10-CM | POA: Diagnosis not present

## 2021-05-25 ENCOUNTER — Other Ambulatory Visit: Payer: Self-pay

## 2021-05-25 ENCOUNTER — Ambulatory Visit (INDEPENDENT_AMBULATORY_CARE_PROVIDER_SITE_OTHER): Payer: Medicare Other | Admitting: Family Medicine

## 2021-05-25 ENCOUNTER — Encounter: Payer: Self-pay | Admitting: Family Medicine

## 2021-05-25 VITALS — BP 138/72 | HR 68 | Ht 68.0 in | Wt 171.0 lb

## 2021-05-25 DIAGNOSIS — Z23 Encounter for immunization: Secondary | ICD-10-CM | POA: Diagnosis not present

## 2021-05-25 DIAGNOSIS — I10 Essential (primary) hypertension: Secondary | ICD-10-CM | POA: Diagnosis not present

## 2021-05-25 DIAGNOSIS — E875 Hyperkalemia: Secondary | ICD-10-CM

## 2021-05-25 NOTE — Progress Notes (Signed)
Date:  05/25/2021   Name:  Alfred Edwards   DOB:  02-Apr-1946   MRN:  [id number]   Chief Complaint: Flu Vaccine and Follow-up (Bp check- cut lisinopril down to '5mg'$  daily.)  Hypertension This is a chronic problem. The current episode started more than 1 year ago. The problem has been gradually improving since onset. The problem is controlled. Pertinent negatives include no anxiety, blurred vision, chest pain, headaches, malaise/fatigue, neck pain, orthopnea, palpitations, peripheral edema, PND, shortness of breath or sweats. Past treatments include ACE inhibitors. The current treatment provides moderate improvement. There are no compliance problems.  There is no history of angina, kidney disease, CAD/MI, CVA, heart failure, left ventricular hypertrophy, PVD or retinopathy. There is no history of chronic renal disease, a hypertension causing med or renovascular disease.   Lab Results  Component Value Date   CREATININE 1.11 03/05/2021   BUN 17 03/05/2021   NA 140 03/05/2021   K 5.6 (H) 03/05/2021   CL 104 03/05/2021   CO2 22 03/05/2021   Lab Results  Component Value Date   CHOL 172 08/24/2020   HDL 82 08/24/2020   LDLCALC 78 08/24/2020   TRIG 64 08/24/2020   CHOLHDL 2.1 08/21/2019   No results found for: TSH Lab Results  Component Value Date   HGBA1C 5.5 11/28/2018   Lab Results  Component Value Date   WBC 7.3 11/28/2018   HGB 12.8 (L) 11/28/2018   HCT 39.7 11/28/2018   MCV 84.8 11/28/2018   PLT 264 11/28/2018   Lab Results  Component Value Date   ALT 18 03/05/2021   AST 14 03/05/2021   ALKPHOS 53 03/05/2021   BILITOT 0.6 03/05/2021     Review of Systems  Constitutional:  Negative for chills, fever and malaise/fatigue.  HENT:  Negative for drooling, ear discharge, ear pain and sore throat.   Eyes:  Negative for blurred vision.  Respiratory:  Negative for cough, shortness of breath and wheezing.   Cardiovascular:  Negative for chest pain, palpitations,  orthopnea, leg swelling and PND.  Gastrointestinal:  Negative for abdominal pain, blood in stool, constipation, diarrhea and nausea.  Endocrine: Negative for polydipsia.  Genitourinary:  Negative for dysuria, frequency, hematuria and urgency.  Musculoskeletal:  Negative for back pain, myalgias and neck pain.  Skin:  Negative for rash.  Allergic/Immunologic: Negative for environmental allergies.  Neurological:  Negative for dizziness and headaches.  Hematological:  Does not bruise/bleed easily.  Psychiatric/Behavioral:  Negative for suicidal ideas. The patient is not nervous/anxious.    Patient Active Problem List   Diagnosis Date Noted   CVA (cerebral vascular accident) ([place]) 11/29/2018   TIA (transient ischemic attack) 11/28/2018   Elevated PSA 08/28/2017   Erectile dysfunction 08/28/2017   Benign prostatic hyperplasia with lower urinary tract symptoms 08/28/2017   Venous insufficiency 02/02/2017   Essential hypertension 12/28/2015   Hyperlipidemia 12/28/2015   Nocturia associated with benign prostatic hypertrophy 12/28/2015   Familial multiple lipoprotein-type hyperlipidemia 01/23/2015   Anxiety disorder due to known physiological condition 01/23/2015   Routine general medical examination at a health care facility 01/23/2015   Adjustment reaction 01/23/2015   Essential (primary) hypertension 01/23/2015   Proteinuria 06/01/2012    No Known Allergies  Past Surgical History:  Procedure Laterality Date   COLONOSCOPY  2005, 01/2015   Dr Vira Agar- cleared for 3 yrs   TONSILLECTOMY     UPPER GASTROINTESTINAL ENDOSCOPY  01/2015   Dr Vira Agar- erosions on esophagus    Social History  Tobacco Use   Smoking status: Former    Types: Cigarettes    Quit date: 09/13/1971    Years since quitting: 49.7   Smokeless tobacco: Never  Vaping Use   Vaping Use: Never used  Substance Use Topics   Alcohol use: Yes    Alcohol/week: 0.0 standard drinks    Comment: socially   Drug use: No      Medication list has been reviewed and updated.  Current Meds  Medication Sig   Ascorbic Acid (VITAMIN C) 500 MG CAPS Take 1 capsule by mouth 2 (two) times daily.    aspirin (ASPIRIN LOW DOSE) 81 MG EC tablet Take 1 tablet (81 mg total) by mouth daily. Swallow whole.   Cholecalciferol (VITAMIN D3) 2000 UNITS capsule Take 1 capsule by mouth daily.   Coenzyme Q10 (COQ-10) 100 MG CAPS Take 1 capsule by mouth daily.   dutasteride (AVODART) 0.5 MG capsule Take 1 capsule (0.5 mg total) by mouth daily. (Patient taking differently: Take 0.5 mg by mouth 3 (three) times a week. Sto[place])   lisinopril (ZESTRIL) 5 MG tablet Take 1 tablet (5 mg total) by mouth daily.   Multiple Vitamins-Minerals (CENTRUM SILVER ADULT 50+ PO) Take 1 tablet by mouth daily.   NIFEdipine (PROCARDIA-XL/NIFEDICAL-XL) 30 MG 24 hr tablet Take 1 tablet (30 mg total) by mouth daily.   Omega-3 Fatty Acids (FISH OIL) 1000 MG CAPS Take 1 capsule (1,000 mg total) by mouth 2 (two) times daily.   simvastatin (ZOCOR) 20 MG tablet Take 1 tablet (20 mg total) by mouth daily.   tadalafil (CIALIS) 20 MG tablet 0.5-1 tab 1 hour prior to intercourse   Zinc 50 MG TABS Take 50 mg by mouth daily.    PHQ 2/9 Scores 03/05/2021 10/14/2020 08/24/2020 02/21/2020  PHQ - 2 Score 0 0 0 0  PHQ- 9 Score 0 - 0 0    GAD 7 : Generalized Anxiety Score 03/05/2021 08/24/2020 02/21/2020 02/21/2020  Nervous, Anxious, on Edge 0 0 0 0  Control/stop worrying 0 0 0 0  Worry too much - different things 0 0 0 0  Trouble relaxing 0 0 0 0  Restless 0 0 0 0  Easily annoyed or irritable 0 0 0 0  Afraid - awful might happen 0 0 0 0  Total GAD 7 Score 0 0 0 0    BP Readings from Last 3 Encounters:  03/05/21 (!) 146/72  09/18/20 (!) 161/76  08/24/20 136/80    Physical Exam Vitals and nursing note reviewed.  HENT:     Right Ear: Tympanic membrane and ear canal normal.     Left Ear: Tympanic membrane and ear canal normal.    Wt Readings from Last 3  Encounters:  05/25/21 171 lb (77.6 kg)  03/05/21 172 lb (78 kg)  09/18/20 171 lb (77.6 kg)    Ht '5\' 8"'$  (1.727 m)   Wt 171 lb (77.6 kg)   BMI 26.00 kg/m   Assessment and Plan: 1. Essential (primary) hypertension Chronic.  Controlled.  Stable.  Blood pressure is 138/72.  Given that patient has had an increase in potassium we decreased his lisinopril and recheck today and patient is in acceptable range.  At this time we will continue on current dosing of lisinopril 5 mg once a day.  2. Hyperkalemia New onset.  Recheck at this time and as noted previous there is been mild elevation of the potassium.  In retrospect it was discovered patient's been using salt substitute which is  potassium chloride and this may have contributed to an increase in his potassium.  At this point time we will continue lisinopril at the current dosing even though he is at the upper limits of normal for control and patient has been encouraged to discontinue KCl substitute and use sodium chloride in reduced amounts. - Potassium  3. Need for immunization against influenza Discussed and administered - Flu Vaccine QUAD High Dose(Fluad)

## 2021-05-26 ENCOUNTER — Other Ambulatory Visit: Payer: Self-pay

## 2021-05-26 ENCOUNTER — Telehealth: Payer: Self-pay

## 2021-05-26 LAB — POTASSIUM: Potassium: 5.4 mmol/L — ABNORMAL HIGH (ref 3.5–5.2)

## 2021-05-26 NOTE — Telephone Encounter (Signed)
Copied from [place] [phone]. Topic: Appointment Scheduling - Scheduling Inquiry for Clinic >> May 26, 2021 12:21 PM Oneta Rack wrote: Patient states he was disconnected from [place] while she was scheduling an appointment for 08/25/2021, patient was unclear if he should keep the appointment for 09/08/2021. Informed the patient the appointment scheduled for 08/25/2021 was for a BP check. Patient would like a follow up call from [place]

## 2021-05-31 ENCOUNTER — Ambulatory Visit: Payer: Self-pay | Admitting: Family Medicine

## 2021-06-16 DIAGNOSIS — M9903 Segmental and somatic dysfunction of lumbar region: Secondary | ICD-10-CM | POA: Diagnosis not present

## 2021-06-16 DIAGNOSIS — M4306 Spondylolysis, lumbar region: Secondary | ICD-10-CM | POA: Diagnosis not present

## 2021-06-16 DIAGNOSIS — M9905 Segmental and somatic dysfunction of pelvic region: Secondary | ICD-10-CM | POA: Diagnosis not present

## 2021-06-16 DIAGNOSIS — M5417 Radiculopathy, lumbosacral region: Secondary | ICD-10-CM | POA: Diagnosis not present

## 2021-07-16 DIAGNOSIS — M5417 Radiculopathy, lumbosacral region: Secondary | ICD-10-CM | POA: Diagnosis not present

## 2021-07-16 DIAGNOSIS — M9903 Segmental and somatic dysfunction of lumbar region: Secondary | ICD-10-CM | POA: Diagnosis not present

## 2021-07-16 DIAGNOSIS — M9905 Segmental and somatic dysfunction of pelvic region: Secondary | ICD-10-CM | POA: Diagnosis not present

## 2021-07-16 DIAGNOSIS — M4306 Spondylolysis, lumbar region: Secondary | ICD-10-CM | POA: Diagnosis not present

## 2021-08-16 DIAGNOSIS — M9905 Segmental and somatic dysfunction of pelvic region: Secondary | ICD-10-CM | POA: Diagnosis not present

## 2021-08-16 DIAGNOSIS — M9903 Segmental and somatic dysfunction of lumbar region: Secondary | ICD-10-CM | POA: Diagnosis not present

## 2021-08-16 DIAGNOSIS — M5417 Radiculopathy, lumbosacral region: Secondary | ICD-10-CM | POA: Diagnosis not present

## 2021-08-16 DIAGNOSIS — M4306 Spondylolysis, lumbar region: Secondary | ICD-10-CM | POA: Diagnosis not present

## 2021-08-23 ENCOUNTER — Encounter: Payer: Self-pay | Admitting: Family Medicine

## 2021-08-23 ENCOUNTER — Ambulatory Visit (INDEPENDENT_AMBULATORY_CARE_PROVIDER_SITE_OTHER): Payer: Medicare Other | Admitting: Family Medicine

## 2021-08-23 ENCOUNTER — Other Ambulatory Visit: Payer: Self-pay

## 2021-08-23 VITALS — BP 146/78 | HR 72 | Ht 68.0 in | Wt 171.0 lb

## 2021-08-23 DIAGNOSIS — I10 Essential (primary) hypertension: Secondary | ICD-10-CM | POA: Diagnosis not present

## 2021-08-23 DIAGNOSIS — E875 Hyperkalemia: Secondary | ICD-10-CM

## 2021-08-23 MED ORDER — NIFEDIPINE ER OSMOTIC RELEASE 60 MG PO TB24: 60.0000 mg | ORAL_TABLET | Freq: Every day | ORAL | 0 refills | Status: DC

## 2021-08-23 NOTE — Progress Notes (Signed)
Date:  08/23/2021   Name:  Alfred Edwards   DOB:  [birth year]   MRN:  [id number]   Chief Complaint: hypokalemia (D/c lisinopril d/t potassium level- recheck bp)  Hypertension This is a chronic problem. The current episode started 1 to 4 weeks ago. The problem has been gradually improving since onset. The problem is controlled. Pertinent negatives include no anxiety, blurred vision, chest pain, headaches, malaise/fatigue, neck pain, orthopnea, palpitations, peripheral edema, PND or shortness of breath. Past treatments include calcium channel blockers. The current treatment provides moderate improvement. There are no compliance problems.  Hypertensive end-organ damage includes CVA. There is no history of angina, kidney disease, CAD/MI, heart failure, left ventricular hypertrophy, PVD or retinopathy. tia. There is no history of chronic renal disease, a hypertension causing med or renovascular disease.   Lab Results  Component Value Date   NA 140 03/05/2021   K 5.4 (H) 05/25/2021   CO2 22 03/05/2021   GLUCOSE 127 (H) 03/05/2021   BUN 17 03/05/2021   CREATININE 1.11 03/05/2021   CALCIUM 9.3 03/05/2021   EGFR 70 03/05/2021   GFRNONAA 67 08/24/2020   Lab Results  Component Value Date   CHOL 172 08/24/2020   HDL 82 08/24/2020   LDLCALC 78 08/24/2020   TRIG 64 08/24/2020   CHOLHDL 2.1 08/21/2019   No results found for: TSH Lab Results  Component Value Date   HGBA1C 5.5 11/28/2018   Lab Results  Component Value Date   WBC 7.3 11/28/2018   HGB 12.8 (L) 11/28/2018   HCT 39.7 11/28/2018   MCV 84.8 11/28/2018   PLT 264 11/28/2018   Lab Results  Component Value Date   ALT 18 03/05/2021   AST 14 03/05/2021   ALKPHOS 53 03/05/2021   BILITOT 0.6 03/05/2021   No results found for: 25OHVITD2, 25OHVITD3, VD25OH   Review of Systems  Constitutional:  Negative for chills, fever and malaise/fatigue.  HENT:  Negative for drooling, ear discharge, ear pain and sore throat.   Eyes:   Negative for blurred vision.  Respiratory:  Negative for cough, shortness of breath and wheezing.   Cardiovascular:  Negative for chest pain, palpitations, orthopnea, leg swelling and PND.  Gastrointestinal:  Negative for abdominal pain, blood in stool, constipation, diarrhea and nausea.  Endocrine: Negative for polydipsia.  Genitourinary:  Negative for dysuria, frequency, hematuria and urgency.  Musculoskeletal:  Negative for back pain, myalgias and neck pain.  Skin:  Negative for rash.  Allergic/Immunologic: Negative for environmental allergies.  Neurological:  Negative for dizziness and headaches.  Hematological:  Does not bruise/bleed easily.  Psychiatric/Behavioral:  Negative for suicidal ideas. The patient is not nervous/anxious.    Patient Active Problem List   Diagnosis Date Noted   CVA (cerebral vascular accident) ([place]) 11/29/2018   TIA (transient ischemic attack) 11/28/2018   Elevated PSA 08/28/2017   Erectile dysfunction 08/28/2017   Benign prostatic hyperplasia with lower urinary tract symptoms 08/28/2017   Venous insufficiency 02/02/2017   Essential hypertension 12/28/2015   Hyperlipidemia 12/28/2015   Nocturia associated with benign prostatic hypertrophy 12/28/2015   Familial multiple lipoprotein-type hyperlipidemia 01/23/2015   Anxiety disorder due to known physiological condition 01/23/2015   Routine general medical examination at a health care facility 01/23/2015   Adjustment reaction 01/23/2015   Essential (primary) hypertension 01/23/2015   Proteinuria 06/01/2012    No Known Allergies  Past Surgical History:  Procedure Laterality Date   COLONOSCOPY  2005, 01/2015   Dr Vira Agar- cleared for 3 yrs  TONSILLECTOMY     UPPER GASTROINTESTINAL ENDOSCOPY  01/2015   Dr Vira Agar- erosions on esophagus    Social History   Tobacco Use   Smoking status: Former    Types: Cigarettes    Quit date: 09/13/1971    Years since quitting: 49.9   Smokeless tobacco: Never   Vaping Use   Vaping Use: Never used  Substance Use Topics   Alcohol use: Yes    Alcohol/week: 0.0 standard drinks    Comment: socially   Drug use: No     Medication list has been reviewed and updated.  Current Meds  Medication Sig   Ascorbic Acid (VITAMIN C) 500 MG CAPS Take 1 capsule by mouth 2 (two) times daily.    aspirin (ASPIRIN LOW DOSE) 81 MG EC tablet Take 1 tablet (81 mg total) by mouth daily. Swallow whole.   Cholecalciferol (VITAMIN D3) 2000 UNITS capsule Take 1 capsule by mouth daily.   Coenzyme Q10 (COQ-10) 100 MG CAPS Take 1 capsule by mouth daily.   dutasteride (AVODART) 0.5 MG capsule Take 1 capsule (0.5 mg total) by mouth daily. (Patient taking differently: Take 0.5 mg by mouth 3 (three) times a week. [place])   Multiple Vitamins-Minerals (CENTRUM SILVER ADULT 50+ PO) Take 1 tablet by mouth daily.   NIFEdipine (PROCARDIA-XL/NIFEDICAL-XL) 30 MG 24 hr tablet Take 1 tablet (30 mg total) by mouth daily.   Omega-3 Fatty Acids (FISH OIL) 1000 MG CAPS Take 1 capsule (1,000 mg total) by mouth 2 (two) times daily.   simvastatin (ZOCOR) 20 MG tablet Take 1 tablet (20 mg total) by mouth daily.   tadalafil (CIALIS) 20 MG tablet 0.5-1 tab 1 hour prior to intercourse   Zinc 50 MG TABS Take 50 mg by mouth daily.    PHQ 2/9 Scores 05/25/2021 03/05/2021 10/14/2020 08/24/2020  PHQ - 2 Score 0 0 0 0  PHQ- 9 Score 0 0 - 0    GAD 7 : Generalized Anxiety Score 05/25/2021 03/05/2021 08/24/2020 02/21/2020  Nervous, Anxious, on Edge 0 0 0 0  Control/stop worrying 0 0 0 0  Worry too much - different things 0 0 0 0  Trouble relaxing 0 0 0 0  Restless 0 0 0 0  Easily annoyed or irritable 0 0 0 0  Afraid - awful might happen 0 0 0 0  Total GAD 7 Score 0 0 0 0    BP Readings from Last 3 Encounters:  08/23/21 (!) 146/78  05/25/21 138/72  03/05/21 (!) 146/72    Physical Exam Vitals and nursing note reviewed.  HENT:     Head: Normocephalic.     Right Ear: Tympanic membrane and  external ear normal.     Left Ear: Tympanic membrane and external ear normal.     Nose: Nose normal.  Eyes:     General: No scleral icterus.       Right eye: No discharge.        Left eye: No discharge.     Conjunctiva/sclera: Conjunctivae normal.     Pupils: Pupils are equal, round, and reactive to light.  Neck:     Thyroid: No thyromegaly.     Vascular: No JVD.     Trachea: No tracheal deviation.  Cardiovascular:     Rate and Rhythm: Normal rate and regular rhythm.     Pulses: Normal pulses.          Carotid pulses are 2+ on the right side and 2+ on the left side.  Radial pulses are 2+ on the right side and 2+ on the left side.       Femoral pulses are 2+ on the right side and 2+ on the left side.      Popliteal pulses are 2+ on the right side and 2+ on the left side.       Dorsalis pedis pulses are 2+ on the right side and 2+ on the left side.       Posterior tibial pulses are 2+ on the right side and 2+ on the left side.     Heart sounds: Normal heart sounds, S1 normal and S2 normal. No murmur heard. No systolic murmur is present.  No diastolic murmur is present.    No friction rub. No gallop. No S3 or S4 sounds.  Pulmonary:     Effort: No respiratory distress.     Breath sounds: Normal breath sounds. No wheezing or rales.  Abdominal:     General: Bowel sounds are normal.     Palpations: Abdomen is soft. There is no mass.     Tenderness: There is no abdominal tenderness. There is no guarding or rebound.  Musculoskeletal:        General: No tenderness. Normal range of motion.     Cervical back: Normal range of motion and neck supple.     Right lower leg: No edema.     Left lower leg: No edema.  Lymphadenopathy:     Cervical: No cervical adenopathy.  Skin:    General: Skin is warm.     Findings: No rash.  Neurological:     Mental Status: He is alert and oriented to person, place, and time.     Cranial Nerves: No cranial nerve deficit.     Deep Tendon Reflexes:  Reflexes are normal and symmetric.    Wt Readings from Last 3 Encounters:  08/23/21 171 lb (77.6 kg)  05/25/21 171 lb (77.6 kg)  03/05/21 172 lb (78 kg)    BP (!) 146/78   Pulse 72   Ht '5\' 8"'  (1.727 m)   Wt 171 lb (77.6 kg)   BMI 26.00 kg/m   Assessment and Plan:  1. Essential (primary) hypertension Chronic.  Uncontrolled.  Blood pressure 146/78.  This is off of lisinopril with only Procardia XL 30.  We will recheck his potassium to see if it has come down in normal range since discontinuance of the ACE inhibitor.  In the meantime we will increase his nifedipine XL to 60 mg which he has taken in the past and has tolerated.  We will recheck blood pressure in 6 weeks. - NIFEdipine (PROCARDIA XL/NIFEDICAL XL) 60 MG 24 hr tablet; Take 1 tablet (60 mg total) by mouth daily.  Dispense: 60 tablet; Refill: 0  2. Hyperkalemia New onset.  Improving.  But not resolved.  We will check potassium for current level of control. - Potassium

## 2021-08-24 ENCOUNTER — Ambulatory Visit: Payer: Medicare Other | Admitting: Family Medicine

## 2021-08-24 LAB — POTASSIUM: Potassium: 5 mmol/L (ref 3.5–5.2)

## 2021-08-25 ENCOUNTER — Ambulatory Visit: Payer: Medicare Other | Admitting: Family Medicine

## 2021-08-26 ENCOUNTER — Other Ambulatory Visit: Payer: Self-pay

## 2021-08-26 DIAGNOSIS — R972 Elevated prostate specific antigen [PSA]: Secondary | ICD-10-CM

## 2021-09-07 ENCOUNTER — Ambulatory Visit: Payer: Self-pay | Admitting: Family Medicine

## 2021-09-08 ENCOUNTER — Ambulatory Visit: Payer: Medicare Other | Admitting: Family Medicine

## 2021-09-15 ENCOUNTER — Telehealth: Payer: Self-pay | Admitting: Family Medicine

## 2021-09-15 NOTE — Telephone Encounter (Signed)
Copied from [place] [id number]. Topic: General - Other >> Sep 15, 2021  3:28 PM Leward Quan A wrote: Reason for CRM: Patient called in to inform Dr Ronnald Ramp that due to swelling of ankles calves and legs he stopped taking the increased dose of NIFEdipine (PROCARDIA XL/NIFEDICAL XL) 60 MG 24 hr tablet say that the swelling have went away the next day after stopping the increased medication and the BP have been averaging 133/72. Please advise Ph# [phone]

## 2021-09-16 ENCOUNTER — Other Ambulatory Visit: Payer: Self-pay

## 2021-09-16 DIAGNOSIS — I10 Essential (primary) hypertension: Secondary | ICD-10-CM

## 2021-09-16 MED ORDER — NIFEDIPINE ER OSMOTIC RELEASE 30 MG PO TB24: 30.0000 mg | ORAL_TABLET | Freq: Every day | ORAL | 0 refills | Status: DC

## 2021-09-16 MED ORDER — HYDROCHLOROTHIAZIDE 12.5 MG PO CAPS: 12.5000 mg | ORAL_CAPSULE | Freq: Every day | ORAL | 0 refills | Status: DC

## 2021-09-16 NOTE — Progress Notes (Signed)
Start nifedipine 30 and stop the 60mg . I sent in the HCTZ 12.5mg - recheck on 10/06/21

## 2021-09-20 ENCOUNTER — Other Ambulatory Visit: Payer: Medicare Other

## 2021-09-20 ENCOUNTER — Other Ambulatory Visit: Payer: Self-pay

## 2021-09-20 DIAGNOSIS — R972 Elevated prostate specific antigen [PSA]: Secondary | ICD-10-CM

## 2021-09-21 DIAGNOSIS — M4306 Spondylolysis, lumbar region: Secondary | ICD-10-CM | POA: Diagnosis not present

## 2021-09-21 DIAGNOSIS — M9903 Segmental and somatic dysfunction of lumbar region: Secondary | ICD-10-CM | POA: Diagnosis not present

## 2021-09-21 DIAGNOSIS — M5417 Radiculopathy, lumbosacral region: Secondary | ICD-10-CM | POA: Diagnosis not present

## 2021-09-21 DIAGNOSIS — M9905 Segmental and somatic dysfunction of pelvic region: Secondary | ICD-10-CM | POA: Diagnosis not present

## 2021-09-21 LAB — PSA: Prostate Specific Ag, Serum: 5 ng/mL — ABNORMAL HIGH (ref 0.0–4.0)

## 2021-09-24 ENCOUNTER — Ambulatory Visit: Payer: Medicare Other | Admitting: Urology

## 2021-09-24 ENCOUNTER — Other Ambulatory Visit: Payer: Self-pay

## 2021-09-24 ENCOUNTER — Other Ambulatory Visit: Payer: Self-pay | Admitting: Family Medicine

## 2021-09-24 ENCOUNTER — Encounter: Payer: Self-pay | Admitting: Urology

## 2021-09-24 VITALS — BP 134/72 | HR 73 | Ht 68.0 in | Wt 166.0 lb

## 2021-09-24 DIAGNOSIS — N5201 Erectile dysfunction due to arterial insufficiency: Secondary | ICD-10-CM | POA: Diagnosis not present

## 2021-09-24 DIAGNOSIS — R972 Elevated prostate specific antigen [PSA]: Secondary | ICD-10-CM | POA: Diagnosis not present

## 2021-09-24 DIAGNOSIS — E7849 Other hyperlipidemia: Secondary | ICD-10-CM

## 2021-09-24 MED ORDER — TADALAFIL 20 MG PO TABS: ORAL_TABLET | ORAL | 3 refills | Status: DC

## 2021-09-24 MED ORDER — DUTASTERIDE 0.5 MG PO CAPS: 0.5000 mg | ORAL_CAPSULE | Freq: Every day | ORAL | 3 refills | Status: DC

## 2021-09-24 NOTE — Progress Notes (Signed)
09/24/2021 12:31 PM   Alfred Edwards 1946-03-08 [id number]  Referring provider: Juline Patch, MD [address] [place],  [place] [address]  Chief Complaint  Patient presents with   Elevated PSA    Urologic history: 1.  Elevated PSA             -Prostate biopsy 2008; PSA 6.2; 52 g gland; benign pathology   2.  BPH with lower urinary tract symptoms             -Dutasteride 3 times weekly   3.  Erectile dysfunction             -Generic sildenafil  HPI: 76 y.o. male presents for annual follow-up.  Doing well since last visit No bothersome LUTS; remains on dutasteride Denies dysuria, gross hematuria Denies flank, abdominal or pelvic pain PSA 09/20/2021 was 5.0 (uncorrected)  PMH: Past Medical History:  Diagnosis Date   BPH (benign prostatic hypertrophy)    GERD (gastroesophageal reflux disease)    Hyperlipidemia    Hypertension    TIA (transient ischemic attack) 11/2018    Surgical History: Past Surgical History:  Procedure Laterality Date   COLONOSCOPY  2005, 01/2015   Dr Vira Agar- cleared for 3 yrs   TONSILLECTOMY     UPPER GASTROINTESTINAL ENDOSCOPY  01/2015   Dr Vira Agar- erosions on esophagus    Home Medications:  Allergies as of 09/24/2021   No Known Allergies      Medication List        Accurate as of September 24, 2021 12:31 PM. If you have any questions, ask your nurse or doctor.          aspirin 81 MG EC tablet Commonly known as: ASPIRIN LOW DOSE Take 1 tablet (81 mg total) by mouth daily. Swallow whole.   CENTRUM SILVER ADULT 50+ PO Take 1 tablet by mouth daily.   CoQ-10 100 MG Caps Take 1 capsule by mouth daily.   dutasteride 0.5 MG capsule Commonly known as: AVODART Take 1 capsule (0.5 mg total) by mouth daily. What changed:  when to take this additional instructions   Fish Oil 1000 MG Caps Take 1 capsule (1,000 mg total) by mouth 2 (two) times daily.   hydrochlorothiazide 12.5 MG capsule Commonly known as:  MICROZIDE Take 1 capsule (12.5 mg total) by mouth daily.   NIFEdipine 30 MG 24 hr tablet Commonly known as: PROCARDIA-XL/NIFEDICAL-XL Take 1 tablet (30 mg total) by mouth daily.   simvastatin 20 MG tablet Commonly known as: ZOCOR TAKE 1 TABLET BY MOUTH EVERY DAY   tadalafil 20 MG tablet Commonly known as: CIALIS 0.5-1 tab 1 hour prior to intercourse   Vitamin C 500 MG Caps Take 1 capsule by mouth 2 (two) times daily.   Vitamin D3 50 MCG (2000 UT) capsule Take 1 capsule by mouth daily.   Zinc 50 MG Tabs Take 50 mg by mouth daily.        Allergies: No Known Allergies  Family History: Family History  Problem Relation Age of Onset   Heart disease Father    Heart disease Brother     Social History:  reports that he quit smoking about 50 years ago. His smoking use included cigarettes. He has never used smokeless tobacco. He reports current alcohol use. He reports that he does not use drugs.   Physical Exam: BP 134/72    Pulse 73    Ht 5\' 8"  (1.727 m)    Wt 166 lb (75.3  kg)    BMI 25.24 kg/m   Constitutional:  Alert and oriented, No acute distress. HEENT: [place] AT, moist mucus membranes.  Trachea midline, no masses. Cardiovascular: No clubbing, cyanosis, or edema. Respiratory: Normal respiratory effort, no increased work of breathing. GU: Prostate 60 g, smooth without nodules Skin: No rashes, bruises or suspicious lesions. Neurologic: Grossly intact, no focal deficits, moving all 4 extremities. Psychiatric: Normal mood and affect.   Assessment & Plan:    1.  Elevated PSA PSA above 5.0 (uncorrected for dutasteride) Benign DRE We discussed options of scheduling a prostate MRI versus repeating his PSA in 6-8 weeks and MRI if persistently elevated.  He would like to proceed with MRI and order was placed Will call with results  2.  Erectile dysfunction Sildenafil refilled  Continue annual follow-up   Abbie Sons, MD  [facility] [address] [place], [place] [address] [phone]

## 2021-09-24 NOTE — Telephone Encounter (Signed)
Requested medication (s) are due for refill today: yes  Requested medication (s) are on the active medication list: yes  Last refill:  06/29/21  Future visit scheduled: yes, 10/06/21  Notes to clinic:  Failed protocol of labs within 360 days, (labs 08/24/2020) has upcoming appt 10/06/21, please assess.   Requested Prescriptions  Pending Prescriptions Disp Refills   simvastatin (ZOCOR) 20 MG tablet [Pharmacy Med Name: SIMVASTATIN 20 MG TABLET] 90 tablet 1    Sig: TAKE 1 TABLET BY MOUTH EVERY DAY     Cardiovascular:  Antilipid - Statins Failed - 09/24/2021  1:28 AM      Failed - Total Cholesterol in normal range and within 360 days    Cholesterol, Total  Date Value Ref Range Status  08/24/2020 172 100 - 199 mg/dL Final          Failed - LDL in normal range and within 360 days    LDL Chol Calc (NIH)  Date Value Ref Range Status  08/24/2020 78 0 - 99 mg/dL Final          Failed - HDL in normal range and within 360 days    HDL  Date Value Ref Range Status  08/24/2020 82 >39 mg/dL Final          Failed - Triglycerides in normal range and within 360 days    Triglycerides  Date Value Ref Range Status  08/24/2020 64 0 - 149 mg/dL Final          Passed - Patient is not pregnant      Passed - Valid encounter within last 12 months    Recent Outpatient Visits           1 month ago Essential (primary) hypertension   [facility] Juline Patch, MD   4 months ago Essential (primary) hypertension   [facility] Juline Patch, MD   6 months ago Essential (primary) hypertension   [facility] Juline Patch, MD   1 year ago Essential (primary) hypertension   [facility] Juline Patch, MD   1 year ago Cerebrovascular disease   [place], Deanna C, MD       Future Appointments             Today [place], Ronda Fairly, MD [place]   In 1 week Juline Patch, MD [facility], [facility]

## 2021-10-06 ENCOUNTER — Ambulatory Visit (INDEPENDENT_AMBULATORY_CARE_PROVIDER_SITE_OTHER): Payer: Medicare Other | Admitting: Family Medicine

## 2021-10-06 ENCOUNTER — Other Ambulatory Visit: Payer: Self-pay

## 2021-10-06 ENCOUNTER — Encounter: Payer: Self-pay | Admitting: Family Medicine

## 2021-10-06 VITALS — BP 130/80 | HR 68 | Ht 68.0 in | Wt 170.0 lb

## 2021-10-06 DIAGNOSIS — I10 Essential (primary) hypertension: Secondary | ICD-10-CM | POA: Diagnosis not present

## 2021-10-06 MED ORDER — NIFEDIPINE ER OSMOTIC RELEASE 30 MG PO TB24: 30.0000 mg | ORAL_TABLET | Freq: Every day | ORAL | 1 refills | Status: DC

## 2021-10-06 MED ORDER — HYDROCHLOROTHIAZIDE 12.5 MG PO CAPS: 12.5000 mg | ORAL_CAPSULE | Freq: Every day | ORAL | 1 refills | Status: DC

## 2021-10-06 NOTE — Progress Notes (Signed)
Date:  10/06/2021   Name:  Alfred Edwards   DOB:  [birth year]   MRN:  [id number]   Chief Complaint: Hypertension (Added HCTZ to Nifedipine- recheck bp)  Hypertension This is a chronic problem. The current episode started more than 1 year ago. The problem has been gradually improving since onset. The problem is controlled. Pertinent negatives include no anxiety, blurred vision, chest pain, headaches, malaise/fatigue, neck pain, orthopnea, palpitations, peripheral edema, PND or shortness of breath. There are no associated agents to hypertension. There are no known risk factors for coronary artery disease. Past treatments include calcium channel blockers and diuretics. The current treatment provides mild improvement. There are no compliance problems.  Hypertensive end-organ damage includes CVA. There is no history of angina, kidney disease, CAD/MI, heart failure, left ventricular hypertrophy, PVD or retinopathy. tia. There is no history of chronic renal disease, a hypertension causing med or renovascular disease.   Lab Results  Component Value Date   NA 140 03/05/2021   K 5.0 08/23/2021   CO2 22 03/05/2021   GLUCOSE 127 (H) 03/05/2021   BUN 17 03/05/2021   CREATININE 1.11 03/05/2021   CALCIUM 9.3 03/05/2021   EGFR 70 03/05/2021   GFRNONAA 67 08/24/2020   Lab Results  Component Value Date   CHOL 172 08/24/2020   HDL 82 08/24/2020   LDLCALC 78 08/24/2020   TRIG 64 08/24/2020   CHOLHDL 2.1 08/21/2019   No results found for: TSH Lab Results  Component Value Date   HGBA1C 5.5 11/28/2018   Lab Results  Component Value Date   WBC 7.3 11/28/2018   HGB 12.8 (L) 11/28/2018   HCT 39.7 11/28/2018   MCV 84.8 11/28/2018   PLT 264 11/28/2018   Lab Results  Component Value Date   ALT 18 03/05/2021   AST 14 03/05/2021   ALKPHOS 53 03/05/2021   BILITOT 0.6 03/05/2021   No results found for: 25OHVITD2, 25OHVITD3, VD25OH   Review of Systems  Constitutional:  Negative for chills,  fever and malaise/fatigue.  HENT:  Negative for drooling, ear discharge, ear pain and sore throat.   Eyes:  Negative for blurred vision.  Respiratory:  Negative for cough, shortness of breath and wheezing.   Cardiovascular:  Negative for chest pain, palpitations, orthopnea, leg swelling and PND.  Gastrointestinal:  Negative for abdominal pain, blood in stool, constipation, diarrhea and nausea.  Endocrine: Negative for polydipsia.  Genitourinary:  Negative for dysuria, frequency, hematuria and urgency.  Musculoskeletal:  Negative for back pain, myalgias and neck pain.  Skin:  Negative for rash.  Allergic/Immunologic: Negative for environmental allergies.  Neurological:  Negative for dizziness and headaches.  Hematological:  Does not bruise/bleed easily.  Psychiatric/Behavioral:  Negative for suicidal ideas. The patient is not nervous/anxious.    Patient Active Problem List   Diagnosis Date Noted   CVA (cerebral vascular accident) ([place]) 11/29/2018   TIA (transient ischemic attack) 11/28/2018   Elevated PSA 08/28/2017   Erectile dysfunction 08/28/2017   Benign prostatic hyperplasia with lower urinary tract symptoms 08/28/2017   Venous insufficiency 02/02/2017   Essential hypertension 12/28/2015   Hyperlipidemia 12/28/2015   Nocturia associated with benign prostatic hypertrophy 12/28/2015   Familial multiple lipoprotein-type hyperlipidemia 01/23/2015   Anxiety disorder due to known physiological condition 01/23/2015   Routine general medical examination at a health care facility 01/23/2015   Adjustment reaction 01/23/2015   Essential (primary) hypertension 01/23/2015   Proteinuria 06/01/2012    No Known Allergies  Past Surgical History:  Procedure Laterality Date   COLONOSCOPY  2005, 01/2015   Dr Vira Agar- cleared for 3 yrs   TONSILLECTOMY     UPPER GASTROINTESTINAL ENDOSCOPY  01/2015   Dr Vira Agar- erosions on esophagus    Social History   Tobacco Use   Smoking status:  Former    Types: Cigarettes    Quit date: 09/13/1971    Years since quitting: 50.0   Smokeless tobacco: Never  Vaping Use   Vaping Use: Never used  Substance Use Topics   Alcohol use: Yes    Alcohol/week: 0.0 standard drinks    Comment: socially   Drug use: No     Medication list has been reviewed and updated.  Current Meds  Medication Sig   Ascorbic Acid (VITAMIN C) 500 MG CAPS Take 1 capsule by mouth 2 (two) times daily.    aspirin (ASPIRIN LOW DOSE) 81 MG EC tablet Take 1 tablet (81 mg total) by mouth daily. Swallow whole.   Cholecalciferol (VITAMIN D3) 2000 UNITS capsule Take 1 capsule by mouth daily.   Coenzyme Q10 (COQ-10) 100 MG CAPS Take 1 capsule by mouth daily.   dutasteride (AVODART) 0.5 MG capsule Take 1 capsule (0.5 mg total) by mouth daily.   hydrochlorothiazide (MICROZIDE) 12.5 MG capsule Take 1 capsule (12.5 mg total) by mouth daily.   Multiple Vitamins-Minerals (CENTRUM SILVER ADULT 50+ PO) Take 1 tablet by mouth daily.   NIFEdipine (PROCARDIA-XL/NIFEDICAL-XL) 30 MG 24 hr tablet Take 1 tablet (30 mg total) by mouth daily.   Omega-3 Fatty Acids (FISH OIL) 1000 MG CAPS Take 1 capsule (1,000 mg total) by mouth 2 (two) times daily.   simvastatin (ZOCOR) 20 MG tablet TAKE 1 TABLET BY MOUTH EVERY DAY   tadalafil (CIALIS) 20 MG tablet 0.5-1 tab 1 hour prior to intercourse   Zinc 50 MG TABS Take 50 mg by mouth daily.    PHQ 2/9 Scores 05/25/2021 03/05/2021 10/14/2020 08/24/2020  PHQ - 2 Score 0 0 0 0  PHQ- 9 Score 0 0 - 0    GAD 7 : Generalized Anxiety Score 05/25/2021 03/05/2021 08/24/2020 02/21/2020  Nervous, Anxious, on Edge 0 0 0 0  Control/stop worrying 0 0 0 0  Worry too much - different things 0 0 0 0  Trouble relaxing 0 0 0 0  Restless 0 0 0 0  Easily annoyed or irritable 0 0 0 0  Afraid - awful might happen 0 0 0 0  Total GAD 7 Score 0 0 0 0    BP Readings from Last 3 Encounters:  10/06/21 130/80  09/24/21 134/72  08/23/21 (!) 146/78    Physical  Exam Vitals and nursing note reviewed.  HENT:     Head: Normocephalic.     Right Ear: External ear normal.     Left Ear: External ear normal.     Nose: Nose normal. No congestion or rhinorrhea.  Eyes:     General: No scleral icterus.       Right eye: No discharge.        Left eye: No discharge.     Conjunctiva/sclera: Conjunctivae normal.     Pupils: Pupils are equal, round, and reactive to light.  Neck:     Thyroid: No thyromegaly.     Vascular: No JVD.     Trachea: No tracheal deviation.  Cardiovascular:     Rate and Rhythm: Normal rate and regular rhythm.     Heart sounds: Normal heart sounds. No murmur heard.   No  friction rub. No gallop.  Pulmonary:     Effort: No respiratory distress.     Breath sounds: Normal breath sounds. No wheezing, rhonchi or rales.  Abdominal:     General: Bowel sounds are normal.     Palpations: Abdomen is soft. There is no mass.     Tenderness: There is no abdominal tenderness. There is no guarding or rebound.  Musculoskeletal:        General: No tenderness. Normal range of motion.     Cervical back: Normal range of motion and neck supple.  Lymphadenopathy:     Cervical: No cervical adenopathy.  Skin:    General: Skin is warm.     Findings: No rash.  Neurological:     Mental Status: He is alert and oriented to person, place, and time.     Cranial Nerves: No cranial nerve deficit.    Wt Readings from Last 3 Encounters:  10/06/21 170 lb (77.1 kg)  09/24/21 166 lb (75.3 kg)  08/23/21 171 lb (77.6 kg)    BP 130/80    Pulse 68    Ht '5\' 8"'  (1.727 m)    Wt 170 lb (77.1 kg)    BMI 25.85 kg/m   Assessment and Plan:  1. Essential (primary) hypertension Chronic.  Controlled.  Stable.  Blood pressure 130/80.  Patient is tolerating hydrochlorothiazide 12.5 mg well and will continue this along with his nifedipine XL 30 mg once a day.  We will recheck patient in 6 months and he has been further instructed to reduce sodium intake when possible. -  NIFEdipine (PROCARDIA-XL/NIFEDICAL-XL) 30 MG 24 hr tablet; Take 1 tablet (30 mg total) by mouth daily.  Dispense: 90 tablet; Refill: 1 - hydrochlorothiazide (MICROZIDE) 12.5 MG capsule; Take 1 capsule (12.5 mg total) by mouth daily.  Dispense: 90 capsule; Refill: 1

## 2021-10-06 NOTE — Patient Instructions (Signed)
How to Take Your Blood Pressure Blood pressure is a measurement of how strongly your blood is pressing against the walls of your arteries. Arteries are blood vessels that carry blood from your heart throughout your body. Your health care provider takes your blood pressure at each office visit. You can also take your own blood pressure at home with a blood pressure monitor. You may need to take your own blood pressure to: Confirm a diagnosis of high blood pressure (hypertension). Monitor your blood pressure over time. Make sure your blood pressure medicine is working. Supplies needed: Blood pressure monitor. Dining room chair to sit in. Table or desk. Small notebook and pencil or pen. How to prepare To get the most accurate reading, avoid the following for 30 minutes before you check your blood pressure: Drinking caffeine. Drinking alcohol. Eating. Smoking. Exercising. Five minutes before you check your blood pressure: Use the bathroom and urinate so that you have an empty bladder. Sit quietly in a dining room chair. Do not sit in a soft couch or an armchair. Do not talk. How to take your blood pressure To check your blood pressure, follow the instructions in the manual that came with your blood pressure monitor. If you have a digital blood pressure monitor, the instructions may be as follows: Sit up straight in a chair. Place your feet on the floor. Do not cross your ankles or legs. Rest your left arm at the level of your heart on a table or desk or on the arm of a chair. Pull up your shirt sleeve. Wrap the blood pressure cuff around the upper part of your left arm, 1 inch (2.5 cm) above your elbow. It is best to wrap the cuff around bare skin. Fit the cuff snugly around your arm. You should be able to place only one finger between the cuff and your arm. Position the cord so that it rests in the bend of your elbow. Press the power button. Sit quietly while the cuff inflates and  deflates. Read the digital reading on the monitor screen and write the numbers down (record them) in a notebook. Wait 2-3 minutes, then repeat the steps, starting at step 1. What does my blood pressure reading mean? A blood pressure reading consists of a higher number over a lower number. Ideally, your blood pressure should be below 120/80. The first ("top") number is called the systolic pressure. It is a measure of the pressure in your arteries as your heart beats. The second ("bottom") number is called the diastolic pressure. It is a measure of the pressure in your arteries as the heart relaxes. Blood pressure is classified into five stages. The following are the stages for adults who do not have a short-term serious illness or a chronic condition. Systolic pressure and diastolic pressure are measured in a unit called mm Hg (millimeters of mercury).  Normal Systolic pressure: below 098. Diastolic pressure: below 80. Elevated Systolic pressure: 119-147. Diastolic pressure: below 80. Hypertension stage 1 Systolic pressure: 829-562. Diastolic pressure: 13-08. Hypertension stage 2 Systolic pressure: 657 or above. Diastolic pressure: 90 or above. You can have elevated blood pressure or hypertension even if only the systolic or only the diastolic number in your reading is higher than normal. Follow these instructions at home: Medicines Take over-the-counter and prescription medicines only as told by your health care provider. Tell your health care provider if you are having any side effects from blood pressure medicine. General instructions Check your blood pressure as often as  recommended by your health care provider. Check your blood pressure at the same time every day. Take your monitor to the next appointment with your health care provider to make sure that: You are using it correctly. It provides accurate readings. Understand what your goal blood pressure numbers are. Keep all  follow-up visits as told by your health care provider. This is important. General tips Your health care provider can suggest a reliable monitor that will meet your needs. There are several types of home blood pressure monitors. Choose a monitor that has an arm cuff. Do not choose a monitor that measures your blood pressure from your wrist or finger. Choose a cuff that wraps snugly around your upper arm. You should be able to fit only one finger between your arm and the cuff. You can buy a blood pressure monitor at most drugstores or online. Where to find more information American Heart Association: www.heart.org Contact a health care provider if: Your blood pressure is consistently high. Your blood pressure is suddenly low. Get help right away if: Your systolic blood pressure is higher than 180. Your diastolic blood pressure is higher than 120. Summary Blood pressure is a measurement of how strongly your blood is pressing against the walls of your arteries. A blood pressure reading consists of a higher number over a lower number. Ideally, your blood pressure should be below 120/80. Check your blood pressure at the same time every day. Avoid caffeine, alcohol, smoking, and exercise for 30 minutes prior to checking your blood pressure. These agents can affect the accuracy of the blood pressure reading. This information is not intended to replace advice given to you by your health care provider. Make sure you discuss any questions you have with your health care provider. Document Revised: 07/08/2020 Document Reviewed: 08/23/2019 Elsevier Patient Education  2022 [employer].

## 2021-10-11 ENCOUNTER — Ambulatory Visit: Payer: Self-pay | Admitting: *Deleted

## 2021-10-11 NOTE — Telephone Encounter (Signed)
Reason for Disposition  [1] MILD-MODERATE pain AND [2] constant and [3] present < 2 hours  Answer Assessment - Initial Assessment Questions 1. LOCATION: "Where does it hurt?"      Happened past 2 weekends in a row.   When I eat steamed veg at this restaurant I've had terrible stomach pain with vomiting and nausea.   It happened this past weekend and this weekend too   No pain now. 2. RADIATION: "Does the pain shoot anywhere else?" (e.g., chest, back)     I'm wondering if it's the restaurant food.  I ate a lot of steamed veggies at [employer].   I don't eat that much at home. 3. ONSET: "When did the pain begin?" (Minutes, hours or days ago)      I had it over the last 2 weekend after eating steamed veggies at this restaurant. My stomach was bloated Sat. And Sun.   I had black water for a stool this morning.   I've been constipated all weekend.   It happened.last weekend.    I don't normally have constipation.    4. SUDDEN: "Gradual or sudden onset?"     *No Answer* 5. PATTERN "Does the pain come and go, or is it constant?"    - If constant: "Is it getting better, staying the same, or worsening?"      (Note: Constant means the pain never goes away completely; most serious pain is constant and it progresses)     - If intermittent: "How long does it last?" "Do you have pain now?"     (Note: Intermittent means the pain goes away completely between bouts)     *No Answer* 6. SEVERITY: "How bad is the pain?"  (e.g., Scale 1-10; mild, moderate, or severe)    - MILD (1-3): doesn't interfere with normal activities, abdomen soft and not tender to touch     - MODERATE (4-7): interferes with normal activities or awakens from sleep, abdomen tender to touch     - SEVERE (8-10): excruciating pain, doubled over, unable to do any normal activities       *No Answer* 7. RECURRENT SYMPTOM: "Have you ever had this type of stomach pain before?" If Yes, ask: "When was the last time?" and "What happened that  time?"      *No Answer* 8. CAUSE: "What do you think is causing the stomach pain?"     This happened in 2000 I got food poisoning in [place] it was these same symptoms. 9. RELIEVING/AGGRAVATING FACTORS: "What makes it better or worse?" (e.g., movement, antacids, bowel movement)     *No Answer* 10. OTHER SYMPTOMS: "Do you have any other symptoms?" (e.g., back pain, diarrhea, fever, urination pain, vomiting)       *No Answer*  Protocols used: Abdominal Pain - Male-A-AH

## 2021-10-11 NOTE — Telephone Encounter (Signed)
°  Chief Complaint: abd pain after eating steamed vegetables at the same restaurant Symptoms: bloating, stomach pain, vomiting Frequency: the past 2 weekends Pertinent Negatives: Patient denies N/A Disposition: [] ED /[] Urgent Care (no appt availability in office) / [] Appointment(In office/virtual)/ []  [facility]/ [x] Home Care/ [] Refused Recommended Disposition /[] [facility] [facility]/ []  Follow-up with PCP Additional Notes:

## 2021-10-15 ENCOUNTER — Other Ambulatory Visit: Payer: Self-pay | Admitting: Family Medicine

## 2021-10-15 DIAGNOSIS — I10 Essential (primary) hypertension: Secondary | ICD-10-CM

## 2021-10-15 NOTE — Telephone Encounter (Signed)
Change in therapy. Discontinued 09/16/21.  Requested Prescriptions  Refused Prescriptions Disp Refills   NIFEdipine (PROCARDIA XL/NIFEDICAL XL) 60 MG 24 hr tablet [Pharmacy Med Name: NIFEDIPINE ER 60 MG TABLET] 60 tablet 0    Sig: TAKE 1 TABLET BY MOUTH EVERY DAY     Cardiovascular: Calcium Channel Blockers 2 Passed - 10/15/2021  2:31 PM      Passed - Last BP in normal range    BP Readings from Last 1 Encounters:  10/06/21 130/80         Passed - Last Heart Rate in normal range    Pulse Readings from Last 1 Encounters:  10/06/21 68         Passed - Valid encounter within last 6 months    Recent Outpatient Visits          1 week ago Essential (primary) hypertension   [facility] Juline Patch, MD   1 month ago Essential (primary) hypertension   [facility] Juline Patch, MD   4 months ago Essential (primary) hypertension   [facility] Juline Patch, MD   7 months ago Essential (primary) hypertension   [facility] Juline Patch, MD   1 year ago Essential (primary) hypertension   [facility] Juline Patch, MD      Future Appointments            In 5 months Juline Patch, MD [facility], [place]   In 11 months [place], [place], [place]

## 2021-10-18 ENCOUNTER — Other Ambulatory Visit: Payer: Self-pay

## 2021-10-18 ENCOUNTER — Ambulatory Visit (INDEPENDENT_AMBULATORY_CARE_PROVIDER_SITE_OTHER): Payer: Medicare Other

## 2021-10-18 VITALS — BP 146/78 | HR 67 | Temp 97.8°F | Resp 16 | Ht 68.0 in | Wt 167.6 lb

## 2021-10-18 DIAGNOSIS — Z0100 Encounter for examination of eyes and vision without abnormal findings: Secondary | ICD-10-CM

## 2021-10-18 DIAGNOSIS — Z0000 Encounter for general adult medical examination without abnormal findings: Secondary | ICD-10-CM | POA: Diagnosis not present

## 2021-10-18 NOTE — Progress Notes (Signed)
Subjective:   Alfred Edwards is a 76 y.o. male who presents for Medicare Annual/Subsequent preventive examination.  Review of Systems     Cardiac Risk Factors include: advanced age (>31men, >15 women);dyslipidemia;hypertension;male gender     Objective:    Today's Vitals   10/18/21 0843  BP: (!) 146/78  Pulse: 67  Resp: 16  Temp: 97.8 F (36.6 C)  TempSrc: Oral  SpO2: 98%  Weight: 167 lb 9.6 oz (76 kg)  Height: 5\' 8"  (1.727 m)   Body mass index is 25.48 kg/m.  Advanced Directives 10/18/2021 10/14/2020 11/28/2018 10/10/2018 07/24/2017 06/29/2015  Does Patient Have a Medical Advance Directive? Yes Yes No Yes Yes Yes  Type of Paramedic of [place];Living will [place];Living will - Living will;Healthcare Power of [place];Living will [place];Living will  Copy of [place] in Chart? Yes - validated most recent copy scanned in chart (See row information) Yes - validated most recent copy scanned in chart (See row information) - Yes - validated most recent copy scanned in chart (See row information) No - copy requested No - copy requested  Would patient like information on creating a medical advance directive? - - No - Patient declined - - -    Current Medications (verified) Outpatient Encounter Medications as of 10/18/2021  Medication Sig   Ascorbic Acid (VITAMIN C) 500 MG CAPS Take 1 capsule by mouth 2 (two) times daily.    aspirin (ASPIRIN LOW DOSE) 81 MG EC tablet Take 1 tablet (81 mg total) by mouth daily. Swallow whole.   Cholecalciferol (VITAMIN D3) 2000 UNITS capsule Take 1 capsule by mouth daily.   Coenzyme Q10 (COQ-10) 100 MG CAPS Take 1 capsule by mouth daily.   dutasteride (AVODART) 0.5 MG capsule Take 1 capsule (0.5 mg total) by mouth daily.   hydrochlorothiazide (MICROZIDE) 12.5 MG capsule Take 1 capsule (12.5 mg total) by mouth daily.   Multiple  Vitamins-Minerals (CENTRUM SILVER ADULT 50+ PO) Take 1 tablet by mouth daily.   NIFEdipine (PROCARDIA-XL/NIFEDICAL-XL) 30 MG 24 hr tablet Take 1 tablet (30 mg total) by mouth daily.   Omega-3 Fatty Acids (FISH OIL) 1000 MG CAPS Take 1 capsule (1,000 mg total) by mouth 2 (two) times daily.   simvastatin (ZOCOR) 20 MG tablet TAKE 1 TABLET BY MOUTH EVERY DAY   tadalafil (CIALIS) 20 MG tablet 0.5-1 tab 1 hour prior to intercourse   Zinc 50 MG TABS Take 50 mg by mouth daily.   No facility-administered encounter medications on file as of 10/18/2021.    Allergies (verified) Patient has no known allergies.   History: Past Medical History:  Diagnosis Date   BPH (benign prostatic hypertrophy)    GERD (gastroesophageal reflux disease)    Hyperlipidemia    Hypertension    TIA (transient ischemic attack) 11/2018   Past Surgical History:  Procedure Laterality Date   COLONOSCOPY  2005, 01/2015   Dr Vira Agar- cleared for 3 yrs   TONSILLECTOMY     UPPER GASTROINTESTINAL ENDOSCOPY  01/2015   Dr Vira Agar- erosions on esophagus   Family History  Problem Relation Age of Onset   Heart disease Father    Heart disease Brother    Social History   Socioeconomic History   Marital status: Widowed    Spouse name: Not on file   Number of children: 0   Years of education: Not on file   Highest education level: Bachelor's degree (e.g.,  BA, AB, BS)  Occupational History   Occupation: Retired  Tobacco Use   Smoking status: Former    Types: Cigarettes    Quit date: 09/13/1971    Years since quitting: 50.1   Smokeless tobacco: Never  Vaping Use   Vaping Use: Never used  Substance and Sexual Activity   Alcohol use: Yes    Alcohol/week: 0.0 standard drinks    Comment: socially   Drug use: No   Sexual activity: Yes  Other Topics Concern   Not on file  Social History Narrative   Wife passed in 2012 from cancer   Pt's significant other lives with him   Social Determinants of Health   Financial  Resource Strain: Low Risk    Difficulty of Paying Living Expenses: Not hard at all  Food Insecurity: No Food Insecurity   Worried About Charity fundraiser in the Last Year: Never true   Arboriculturist in the Last Year: Never true  Transportation Needs: No Transportation Needs   Lack of Transportation (Medical): No   Lack of Transportation (Non-Medical): No  Physical Activity: Sufficiently Active   Days of Exercise per Week: 7 days   Minutes of Exercise per Session: 30 min  Stress: No Stress Concern Present   Feeling of Stress : Not at all  Social Connections: Moderately Integrated   Frequency of Communication with Friends and Family: More than three times a week   Frequency of Social Gatherings with Friends and Family: Three times a week   Attends Religious Services: More than 4 times per year   Active Member of Clubs or Organizations: No   Attends Archivist Meetings: Never   Marital Status: Living with partner    Tobacco Counseling Counseling given: Not Answered   Clinical Intake:  Pre-visit preparation completed: Yes  Pain : No/denies pain     BMI - recorded: 25.48 Nutritional Status: BMI 25 -29 Overweight Nutritional Risks: None Diabetes: No  How often do you need to have someone help you when you read instructions, pamphlets, or other written materials from your doctor or pharmacy?: 1 - Never    Interpreter Needed?: No  Information entered by :: Clemetine Marker LPN   Activities of Daily Living In your present state of health, do you have any difficulty performing the following activities: 10/18/2021  Hearing? Y  Vision? Y  Difficulty concentrating or making decisions? Y  Walking or climbing stairs? N  Dressing or bathing? N  Doing errands, shopping? N  Preparing Food and eating ? N  Using the Toilet? N  In the past six months, have you accidently leaked urine? N  Do you have problems with loss of bowel control? N  Managing your Medications? N   Managing your Finances? N  Housekeeping or managing your Housekeeping? N  Some recent data might be hidden    Patient Care Team: Juline Patch, MD as PCP - General (Family Medicine) Abbie Sons, MD as Consulting Physician (Urology) Ree Edman, MD as Consulting Physician (Dermatology)  Indicate any recent Medical Services you may have received from other than [facility] providers in the past year (date may be approximate).     Assessment:   This is a routine wellness examination for Myshawn.  Hearing/Vision screen Hearing Screening - Comments:: Pt c/o mild hearing difficulty, declines hearing aids Vision Screening - Comments:: Past due for eye exam, new referral sent to [facility] today.   Dietary issues and exercise activities discussed:  Current Exercise Habits: Home exercise routine, Type of exercise: walking, Time (Minutes): 30, Frequency (Times/Week): 7, Weekly Exercise (Minutes/Week): 210, Intensity: Moderate, Exercise limited by: None identified   Goals Addressed             This Visit's Progress    DIET - INCREASE WATER INTAKE   Not on track    Recommend drinking 6-8 glasses of water per day      Reduce sugar intake to X grams per day   On track    Continue to reduce sweets and carbs from diet       Depression Screen PHQ 2/9 Scores 10/18/2021 05/25/2021 03/05/2021 10/14/2020 08/24/2020 02/21/2020 02/21/2020  PHQ - 2 Score 0 0 0 0 0 0 0  PHQ- 9 Score 3 0 0 - 0 0 0    Fall Risk Fall Risk  10/18/2021 10/14/2020 02/21/2020 02/21/2020 10/14/2019  Falls in the past year? 0 0 0 0 0  Number falls in past yr: 0 0 - - 0  Injury with Fall? 0 0 - - 0  Risk for fall due to : No Fall Risks No Fall Risks - - No Fall Risks  Follow up Falls prevention discussed Falls prevention discussed Falls evaluation completed Falls evaluation completed Falls prevention discussed    FALL RISK PREVENTION PERTAINING TO THE HOME:  Any stairs in or around the home? No  If so, are  there any without handrails? No  Home free of loose throw rugs in walkways, pet beds, electrical cords, etc? Yes  Adequate lighting in your home to reduce risk of falls? Yes   ASSISTIVE DEVICES UTILIZED TO PREVENT FALLS:  Life alert? No  Use of a cane, walker or w/c? No  Grab bars in the bathroom? Yes  Shower chair or bench in shower? Yes  Elevated toilet seat or a handicapped toilet? Yes   TIMED UP AND GO:  Was the test performed? Yes .  Length of time to ambulate 10 feet: 5 sec.   Gait steady and fast without use of assistive device  Cognitive Function: Normal cognitive status assessed by direct observation by this Nurse Health Advisor. No abnormalities found.       6CIT Screen 10/14/2019 10/10/2018 07/24/2017  What Year? 0 points 0 points 0 points  What month? 0 points 0 points 0 points  What time? 0 points 0 points 0 points  Count back from 20 0 points 0 points 0 points  Months in reverse 0 points 0 points 0 points  Repeat phrase 0 points 0 points 0 points  Total Score 0 0 0    Immunizations Immunization History  Administered Date(s) Administered   Fluad Quad(high Dose 65+) 08/24/2020, 05/25/2021   Influenza, High Dose Seasonal PF 06/22/2017, 07/24/2018, 05/15/2019   Influenza,inj,Quad PF,6+ Mos 07/01/2013, 06/27/2014, 06/29/2015, 08/02/2016   Influenza-Unspecified 06/27/2014   PFIZER(Purple Top)SARS-COV-2 Vaccination 11/14/2019, 12/05/2019   Pneumococcal Conjugate-13 07/24/2017   Pneumococcal Polysaccharide-23 06/23/2008, 08/22/2018   Tdap 12/06/2012    TDAP status: Up to date  Flu Vaccine status: Up to date  Pneumococcal vaccine status: Up to date  Covid-19 vaccine status: Completed vaccines  Qualifies for Shingles Vaccine? Yes   Zostavax completed No   Shingrix Completed?: No.    Education has been provided regarding the importance of this vaccine. Patient has been advised to call insurance company to determine out of pocket expense if they have not yet  received this vaccine. Advised may also receive vaccine at local pharmacy or Health Dept.  Verbalized acceptance and understanding.  Screening Tests Health Maintenance  Topic Date Due   Zoster Vaccines- Shingrix (1 of 2) 11/21/2021 (Originally 02/27/1998)   TETANUS/TDAP  12/07/2022   COLONOSCOPY (Pts 45-39yrs Insurance coverage will need to be confirmed)  02/14/2023   Pneumonia Vaccine 90+ Years old  Completed   INFLUENZA VACCINE  Completed   Hepatitis C Screening  Completed   HPV VACCINES  Aged Out   COVID-19 Vaccine  Discontinued    Health Maintenance  There are no preventive care reminders to display for this patient.  Colorectal cancer screening: Type of screening: Colonoscopy. Completed 02/13/18. Repeat every 5 years  Lung Cancer Screening: (Low Dose CT Chest recommended if Age 68-80 years, 30 pack-year currently smoking OR have quit w/in 15years.) does not qualify.   Additional Screening:  Hepatitis C Screening: does qualify; Completed 07/24/17  Vision Screening: Recommended annual ophthalmology exams for early detection of glaucoma and other disorders of the eye. Is the patient up to date with their annual eye exam?  No  Who is the provider or what is the name of the office in which the patient attends annual eye exams? Not established If pt is not established with a provider, would they like to be referred to a provider to establish care? Yes .   Dental Screening: Recommended annual dental exams for proper oral hygiene  Community Resource Referral / Chronic Care Management: CRR required this visit?  No   CCM required this visit?  No      Plan:     I have personally reviewed and noted the following in the patients chart:   Medical and social history Use of alcohol, tobacco or illicit drugs  Current medications and supplements including opioid prescriptions. Patient is not currently taking opioid prescriptions. Functional ability and status Nutritional  status Physical activity Advanced directives List of other physicians Hospitalizations, surgeries, and ER visits in previous 12 months Vitals Screenings to include cognitive, depression, and falls Referrals and appointments  In addition, I have reviewed and discussed with patient certain preventive protocols, quality metrics, and best practice recommendations. A written personalized care plan for preventive services as well as general preventive health recommendations were provided to patient.     Clemetine Marker, LPN   04/19/4165   Nurse Notes: pt seen by Dr. Bernardo Heater 09/24/21 PSA elevated to 5.0. Pt awaiting appt for MRI; advised to call Dr. Bernardo Heater for follow up on order.

## 2021-10-18 NOTE — Patient Instructions (Signed)
Alfred Edwards , Thank you for taking time to come for your Medicare Wellness Visit. I appreciate your ongoing commitment to your health goals. Please review the following plan we discussed and let me know if I can assist you in the future.   Screening recommendations/referrals: Colonoscopy: done 02/13/18. Repeat 02/2023 Recommended yearly ophthalmology/optometry visit for glaucoma screening and checkup Recommended yearly dental visit for hygiene and checkup  Vaccinations: Influenza vaccine: done 05/25/21 Pneumococcal vaccine: done 08/22/18 Tdap vaccine: done 12/06/12 Shingles vaccine: Shingrix discussed. Please contact your pharmacy for coverage information.  Covid-19: done 11/14/19 & 12/05/19  Conditions/risks identified: Recommend drinking 6-8 glasses of water per day   Next appointment: Follow up in one year for your annual wellness visit.   Preventive Care 27 Years and Older, Male Preventive care refers to lifestyle choices and visits with your health care provider that can promote health and wellness. What does preventive care include? A yearly physical exam. This is also called an annual well check. Dental exams once or twice a year. Routine eye exams. Ask your health care provider how often you should have your eyes checked. Personal lifestyle choices, including: Daily care of your teeth and gums. Regular physical activity. Eating a healthy diet. Avoiding tobacco and drug use. Limiting alcohol use. Practicing safe sex. Taking low doses of aspirin every day. Taking vitamin and mineral supplements as recommended by your health care provider. What happens during an annual well check? The services and screenings done by your health care provider during your annual well check will depend on your age, overall health, lifestyle risk factors, and family history of disease. Counseling  Your health care provider may ask you questions about your: Alcohol use. Tobacco use. Drug  use. Emotional well-being. Home and relationship well-being. Sexual activity. Eating habits. History of falls. Memory and ability to understand (cognition). Work and work Statistician. Screening  You may have the following tests or measurements: Height, weight, and BMI. Blood pressure. Lipid and cholesterol levels. These may be checked every 5 years, or more frequently if you are over 14 years old. Skin check. Lung cancer screening. You may have this screening every year starting at age 49 if you have a 30-pack-year history of smoking and currently smoke or have quit within the past 15 years. Fecal occult blood test (FOBT) of the stool. You may have this test every year starting at age 74. Flexible sigmoidoscopy or colonoscopy. You may have a sigmoidoscopy every 5 years or a colonoscopy every 10 years starting at age 29. Prostate cancer screening. Recommendations will vary depending on your family history and other risks. Hepatitis C blood test. Hepatitis B blood test. Sexually transmitted disease (STD) testing. Diabetes screening. This is done by checking your blood sugar (glucose) after you have not eaten for a while (fasting). You may have this done every 1-3 years. Abdominal aortic aneurysm (AAA) screening. You may need this if you are a current or former smoker. Osteoporosis. You may be screened starting at age 34 if you are at high risk. Talk with your health care provider about your test results, treatment options, and if necessary, the need for more tests. Vaccines  Your health care provider may recommend certain vaccines, such as: Influenza vaccine. This is recommended every year. Tetanus, diphtheria, and acellular pertussis (Tdap, Td) vaccine. You may need a Td booster every 10 years. Zoster vaccine. You may need this after age 60. Pneumococcal 13-valent conjugate (PCV13) vaccine. One dose is recommended after age 52. Pneumococcal polysaccharide (PPSV23)  vaccine. One dose is  recommended after age 10. Talk to your health care provider about which screenings and vaccines you need and how often you need them. This information is not intended to replace advice given to you by your health care provider. Make sure you discuss any questions you have with your health care provider. Document Released: 09/25/2015 Document Revised: 05/18/2016 Document Reviewed: 06/30/2015 Elsevier Interactive Patient Education  2017 [place] Prevention in the Home Falls can cause injuries. They can happen to people of all ages. There are many things you can do to make your home safe and to help prevent falls. What can I do on the outside of my home? Regularly fix the edges of walkways and driveways and fix any cracks. Remove anything that might make you trip as you walk through a door, such as a raised step or threshold. Trim any bushes or trees on the path to your home. Use bright outdoor lighting. Clear any walking paths of anything that might make someone trip, such as rocks or tools. Regularly check to see if handrails are loose or broken. Make sure that both sides of any steps have handrails. Any raised decks and porches should have guardrails on the edges. Have any leaves, snow, or ice cleared regularly. Use sand or salt on walking paths during winter. Clean up any spills in your garage right away. This includes oil or grease spills. What can I do in the bathroom? Use night lights. Install grab bars by the toilet and in the tub and shower. Do not use towel bars as grab bars. Use non-skid mats or decals in the tub or shower. If you need to sit down in the shower, use a plastic, non-slip stool. Keep the floor dry. Clean up any water that spills on the floor as soon as it happens. Remove soap buildup in the tub or shower regularly. Attach bath mats securely with double-sided non-slip rug tape. Do not have throw rugs and other things on the floor that can make you  trip. What can I do in the bedroom? Use night lights. Make sure that you have a light by your bed that is easy to reach. Do not use any sheets or blankets that are too big for your bed. They should not hang down onto the floor. Have a firm chair that has side arms. You can use this for support while you get dressed. Do not have throw rugs and other things on the floor that can make you trip. What can I do in the kitchen? Clean up any spills right away. Avoid walking on wet floors. Keep items that you use a lot in easy-to-reach places. If you need to reach something above you, use a strong step stool that has a grab bar. Keep electrical cords out of the way. Do not use floor polish or wax that makes floors slippery. If you must use wax, use non-skid floor wax. Do not have throw rugs and other things on the floor that can make you trip. What can I do with my stairs? Do not leave any items on the stairs. Make sure that there are handrails on both sides of the stairs and use them. Fix handrails that are broken or loose. Make sure that handrails are as long as the stairways. Check any carpeting to make sure that it is firmly attached to the stairs. Fix any carpet that is loose or worn. Avoid having throw rugs at the top or bottom  of the stairs. If you do have throw rugs, attach them to the floor with carpet tape. Make sure that you have a light switch at the top of the stairs and the bottom of the stairs. If you do not have them, ask someone to add them for you. What else can I do to help prevent falls? Wear shoes that: Do not have high heels. Have rubber bottoms. Are comfortable and fit you well. Are closed at the toe. Do not wear sandals. If you use a stepladder: Make sure that it is fully opened. Do not climb a closed stepladder. Make sure that both sides of the stepladder are locked into place. Ask someone to hold it for you, if possible. Clearly mark and make sure that you can  see: Any grab bars or handrails. First and last steps. Where the edge of each step is. Use tools that help you move around (mobility aids) if they are needed. These include: Canes. Walkers. Scooters. Crutches. Turn on the lights when you go into a dark area. Replace any light bulbs as soon as they burn out. Set up your furniture so you have a clear path. Avoid moving your furniture around. If any of your floors are uneven, fix them. If there are any pets around you, be aware of where they are. Review your medicines with your doctor. Some medicines can make you feel dizzy. This can increase your chance of falling. Ask your doctor what other things that you can do to help prevent falls. This information is not intended to replace advice given to you by your health care provider. Make sure you discuss any questions you have with your health care provider. Document Released: 06/25/2009 Document Revised: 02/04/2016 Document Reviewed: 10/03/2014 Elsevier Interactive Patient Education  2017 [employer].

## 2021-10-20 DIAGNOSIS — M9903 Segmental and somatic dysfunction of lumbar region: Secondary | ICD-10-CM | POA: Diagnosis not present

## 2021-10-20 DIAGNOSIS — M5417 Radiculopathy, lumbosacral region: Secondary | ICD-10-CM | POA: Diagnosis not present

## 2021-10-20 DIAGNOSIS — M4306 Spondylolysis, lumbar region: Secondary | ICD-10-CM | POA: Diagnosis not present

## 2021-10-20 DIAGNOSIS — M9905 Segmental and somatic dysfunction of pelvic region: Secondary | ICD-10-CM | POA: Diagnosis not present

## 2021-11-01 ENCOUNTER — Other Ambulatory Visit: Payer: Self-pay | Admitting: Family Medicine

## 2021-11-01 DIAGNOSIS — E7849 Other hyperlipidemia: Secondary | ICD-10-CM

## 2021-11-02 ENCOUNTER — Other Ambulatory Visit: Payer: Self-pay | Admitting: Family Medicine

## 2021-11-02 DIAGNOSIS — I10 Essential (primary) hypertension: Secondary | ICD-10-CM

## 2021-11-02 NOTE — Telephone Encounter (Signed)
RF marked as no print- resent to pharmacy Requested Prescriptions  Pending Prescriptions Disp Refills   NIFEdipine (PROCARDIA-XL/NIFEDICAL-XL) 30 MG 24 hr tablet [Pharmacy Med Name: NIFEDIPINE ER 30 MG TABLET] 90 tablet 1    Sig: TAKE 1 TABLET BY MOUTH EVERY DAY     Cardiovascular: Calcium Channel Blockers 2 Failed - 11/02/2021  1:40 AM      Failed - Last BP in normal range    BP Readings from Last 1 Encounters:  10/18/21 (!) 146/78         Passed - Last Heart Rate in normal range    Pulse Readings from Last 1 Encounters:  10/18/21 67         Passed - Valid encounter within last 6 months    Recent Outpatient Visits          3 weeks ago Essential (primary) hypertension   [facility] Juline Patch, MD   2 months ago Essential (primary) hypertension   [facility] Juline Patch, MD   5 months ago Essential (primary) hypertension   [facility] Juline Patch, MD   8 months ago Essential (primary) hypertension   [facility] Juline Patch, MD   1 year ago Essential (primary) hypertension   [facility] Juline Patch, MD      Future Appointments            In 5 months Juline Patch, MD [facility], [place]   In 11 months [place], Ronda Fairly, [place]

## 2021-11-02 NOTE — Telephone Encounter (Signed)
Requested medications are due for refill today.  no  Requested medications are on the active medications list.  yes  Last refill. 09/24/2021 #90 0 refills - should last until 12/23/2021  Future visit scheduled.   yes  Notes to clinic.  Medication not due for refill - labs are expired.    Requested Prescriptions  Pending Prescriptions Disp Refills   simvastatin (ZOCOR) 20 MG tablet [Pharmacy Med Name: SIMVASTATIN 20 MG TABLET] 90 tablet 0    Sig: TAKE 1 TABLET BY MOUTH EVERY DAY     Cardiovascular:  Antilipid - Statins Failed - 11/01/2021  2:36 PM      Failed - Lipid Panel in normal range within the last 12 months    Cholesterol, Total  Date Value Ref Range Status  08/24/2020 172 100 - 199 mg/dL Final   LDL Chol Calc (NIH)  Date Value Ref Range Status  08/24/2020 78 0 - 99 mg/dL Final   HDL  Date Value Ref Range Status  08/24/2020 82 >39 mg/dL Final   Triglycerides  Date Value Ref Range Status  08/24/2020 64 0 - 149 mg/dL Final         Passed - Patient is not pregnant      Passed - Valid encounter within last 12 months    Recent Outpatient Visits           3 weeks ago Essential (primary) hypertension   [facility] Juline Patch, MD   2 months ago Essential (primary) hypertension   [facility] Juline Patch, MD   5 months ago Essential (primary) hypertension   [facility] Juline Patch, MD   8 months ago Essential (primary) hypertension   [facility] Juline Patch, MD   1 year ago Essential (primary) hypertension   [facility] Juline Patch, MD       Future Appointments             In 5 months Juline Patch, MD [facility], [place]   In 11 months [place], Ronda Fairly, [place]

## 2021-11-17 DIAGNOSIS — M4306 Spondylolysis, lumbar region: Secondary | ICD-10-CM | POA: Diagnosis not present

## 2021-11-17 DIAGNOSIS — M9903 Segmental and somatic dysfunction of lumbar region: Secondary | ICD-10-CM | POA: Diagnosis not present

## 2021-11-17 DIAGNOSIS — M9905 Segmental and somatic dysfunction of pelvic region: Secondary | ICD-10-CM | POA: Diagnosis not present

## 2021-11-17 DIAGNOSIS — M5417 Radiculopathy, lumbosacral region: Secondary | ICD-10-CM | POA: Diagnosis not present

## 2021-11-26 ENCOUNTER — Ambulatory Visit (INDEPENDENT_AMBULATORY_CARE_PROVIDER_SITE_OTHER): Payer: Medicare Other

## 2021-11-26 ENCOUNTER — Other Ambulatory Visit: Payer: Self-pay

## 2021-11-26 ENCOUNTER — Ambulatory Visit
Admission: EM | Admit: 2021-11-26 | Discharge: 2021-11-26 | Disposition: A | Discharge status: Home / Self Care | Payer: Medicare Other | Attending: Physician Assistant | Admitting: Physician Assistant

## 2021-11-26 ENCOUNTER — Encounter: Payer: Self-pay | Admitting: Emergency Medicine

## 2021-11-26 DIAGNOSIS — S51011A Laceration without foreign body of right elbow, initial encounter: Secondary | ICD-10-CM | POA: Diagnosis not present

## 2021-11-26 DIAGNOSIS — W19XXXA Unspecified fall, initial encounter: Secondary | ICD-10-CM

## 2021-11-26 DIAGNOSIS — M25511 Pain in right shoulder: Secondary | ICD-10-CM

## 2021-11-26 MED ORDER — ACETAMINOPHEN 325 MG PO TABS
650.0000 mg | ORAL_TABLET | Freq: Once | ORAL | Status: AC
  Administered 2021-11-26: 650 mg via ORAL

## 2021-11-26 NOTE — Discharge Instructions (Signed)
-  X-rays do not show any fractures.  As we discussed, x-rays cannot rule out rotator cuff tears so if your shoulder pain is not improving over the next week, your range of motion is not improving over the next week or your pain worsens you should follow-up with your PCP or Ortho.  Can go to [facility] walk-in urgent care in [place] or the [facility] orthopedic clinic for evaluation as you may need an MRI. ?- I have repaired some of the skin tear with skin adhesive.  See handout.  The other part you should keep covered.  Change bandage every day. ?- Tylenol for discomfort.  Can also ice the shoulder.  Sling for comfort but you should take it out every now and then to try to move the shoulder around so it does not become frozen shoulder. ?

## 2021-11-26 NOTE — ED Provider Notes (Signed)
?[facility] ? ? ? ?CSN: [id number] ?Arrival date & time: 11/26/21  1226 ? ? ?  ? ?History   ?Chief Complaint ?Chief Complaint  ?Patient presents with  ?? Fall  ?  Right elbow and shoulder pain  ? ? ?HPI ?Alfred Edwards is a 77 y.o. male presenting with his wife for injuries following an accidental fall today. He got tripped on his feet and fell onto concrete.  Patient says he fell onto his right shoulder and arm.  Did not injure head or neck.  No LOC.  His wife was witness.  He has a skin tear of his elbow but is mostly concerned about right shoulder pain and possible rotator cuff tear.  He has difficulty raising arm above shoulder.  No numbness, tingling or weakness in extremity.  Bleeding is controlled.  It is bandaged.  It has not been clean.  He is unsure of his last tetanus.  Review of medical records shows 12/06/2012.  Patient denies any other injuries.  No vision changes, nausea/vomiting.  No confusion or behavioral changes. ? ?HPI ? ?Past Medical History:  ?Diagnosis Date  ?? BPH (benign prostatic hypertrophy)   ?? GERD (gastroesophageal reflux disease)   ?? Hyperlipidemia   ?? Hypertension   ?? TIA (transient ischemic attack) 11/2018  ? ? ?Patient Active Problem List  ? Diagnosis Date Noted  ?? CVA (cerebral vascular accident) ([place]) 11/29/2018  ?? TIA (transient ischemic attack) 11/28/2018  ?? Elevated PSA 08/28/2017  ?? Erectile dysfunction 08/28/2017  ?? Benign prostatic hyperplasia with lower urinary tract symptoms 08/28/2017  ?? Venous insufficiency 02/02/2017  ?? Essential hypertension 12/28/2015  ?? Hyperlipidemia 12/28/2015  ?? Nocturia associated with benign prostatic hypertrophy 12/28/2015  ?? Familial multiple lipoprotein-type hyperlipidemia 01/23/2015  ?? Anxiety disorder due to known physiological condition 01/23/2015  ?? Routine general medical examination at a health care facility 01/23/2015  ?? Adjustment reaction 01/23/2015  ?? Essential (primary) hypertension 01/23/2015  ??  Proteinuria 06/01/2012  ? ? ?Past Surgical History:  ?Procedure Laterality Date  ?? COLONOSCOPY  2005, 01/2015  ? Dr Vira Agar- cleared for 3 yrs  ?? TONSILLECTOMY    ?? UPPER GASTROINTESTINAL ENDOSCOPY  01/2015  ? Dr Vira Agar- erosions on esophagus  ? ? ? ? ? ?Home Medications   ? ?Prior to Admission medications   ?Medication Sig Start Date End Date Taking? Authorizing Provider  ?Ascorbic Acid (VITAMIN C) 500 MG CAPS Take 1 capsule by mouth 2 (two) times daily.     [provider]  ?aspirin (ASPIRIN LOW DOSE) 81 MG EC tablet Take 1 tablet (81 mg total) by mouth daily. Swallow whole. 02/20/19   Juline Patch, MD  ?Cholecalciferol (VITAMIN D3) 2000 UNITS capsule Take 1 capsule by mouth daily.    [provider]  ?Coenzyme Q10 (COQ-10) 100 MG CAPS Take 1 capsule by mouth daily.    [provider]  ?dutasteride (AVODART) 0.5 MG capsule Take 1 capsule (0.5 mg total) by mouth daily. 09/24/21   Stoioff, Ronda Fairly, MD  ?hydrochlorothiazide (MICROZIDE) 12.5 MG capsule Take 1 capsule (12.5 mg total) by mouth daily. 10/06/21   Juline Patch, MD  ?Multiple Vitamins-Minerals (CENTRUM SILVER ADULT 50+ PO) Take 1 tablet by mouth daily.    [provider]  ?NIFEdipine (PROCARDIA-XL/NIFEDICAL-XL) 30 MG 24 hr tablet TAKE 1 TABLET BY MOUTH EVERY DAY 11/02/21   Juline Patch, MD  ?Omega-3 Fatty Acids (FISH OIL) 1000 MG CAPS Take 1 capsule (1,000 mg total) by mouth  2 (two) times daily. 08/21/19   Juline Patch, MD  ?simvastatin (ZOCOR) 20 MG tablet TAKE 1 TABLET BY MOUTH EVERY DAY 11/02/21   Juline Patch, MD  ?tadalafil (CIALIS) 20 MG tablet 0.5-1 tab 1 hour prior to intercourse 09/24/21   Abbie Sons, MD  ?Zinc 50 MG TABS Take 50 mg by mouth daily.    [provider]  ? ? ?Family History ?Family History  ?Problem Relation Age of Onset  ?? Heart disease Father   ?? Heart disease Brother   ? ? ?Social History ?Social History  ? ?Tobacco Use  ?? Smoking status: Former  ?  Types: Cigarettes   ?  Quit date: 09/13/1971  ?  Years since quitting: 50.2  ?? Smokeless tobacco: Never  ?Vaping Use  ?? Vaping Use: Never used  ?Substance Use Topics  ?? Alcohol use: Yes  ?  Alcohol/week: 0.0 standard drinks  ?  Comment: socially  ?? Drug use: No  ? ? ? ?Allergies   ?Patient has no known allergies. ? ? ?Review of Systems ?Review of Systems  ?Constitutional:  Negative for fatigue.  ?Eyes:  Negative for photophobia and visual disturbance.  ?Respiratory:  Negative for shortness of breath.   ?Cardiovascular:  Negative for chest pain.  ?Gastrointestinal:  Negative for nausea and vomiting.  ?Musculoskeletal:  Positive for arthralgias. Negative for back pain, gait problem, joint swelling and neck pain.  ?Skin:  Positive for wound.  ?Neurological:  Negative for dizziness, syncope, facial asymmetry, speech difficulty, weakness, light-headedness, numbness and headaches.  ?Psychiatric/Behavioral:  Negative for confusion.   ? ? ?Physical Exam ?Triage Vital Signs ?ED Triage Vitals [11/26/21 1256]  ?Enc Vitals Group  ?   BP (!) 179/84  ?   Pulse Rate 84  ?   Resp 18  ?   Temp 98.4 ?F (36.9 ?C)  ?   Temp src   ?   SpO2 97 %  ?   Weight   ?   Height   ?   Head Circumference   ?   Peak Flow   ?   Pain Score 7  ?   Pain Loc   ?   Pain Edu?   ?   Excl. in [place]?   ? ?No data found. ? ?Updated Vital Signs ?BP (!) 179/84   Pulse 84   Temp 98.4 ?F (36.9 ?C)   Resp 18   SpO2 97%  ?   ? ?Physical Exam ?Vitals and nursing note reviewed.  ?Constitutional:   ?   General: He is not in acute distress. ?   Appearance: Normal appearance. He is well-developed. He is not ill-appearing.  ?HENT:  ?   Head: Normocephalic and atraumatic.  ?   Nose: Nose normal.  ?   Mouth/Throat:  ?   Mouth: Mucous membranes are moist.  ?   Pharynx: Oropharynx is clear.  ?Eyes:  ?   General: No scleral icterus.    ?   Right eye: No discharge.     ?   Left eye: No discharge.  ?   Extraocular Movements: Extraocular movements intact.  ?   Conjunctiva/sclera: Conjunctivae  normal.  ?   Pupils: Pupils are equal, round, and reactive to light.  ?Cardiovascular:  ?   Rate and Rhythm: Normal rate and regular rhythm.  ?   Heart sounds: Normal heart sounds.  ?Pulmonary:  ?   Effort: Pulmonary effort is normal. No respiratory distress.  ?   Breath  sounds: Normal breath sounds.  ?Musculoskeletal:  ?   Right shoulder: Bony tenderness (proximal humerus) present. No swelling. Decreased range of motion (reduced ROM to 90 degrees flexion and abduction). Normal pulse.  ?   Right upper arm: Normal.  ?   Right elbow: Laceration (skin tear lateral elbow) present. No swelling or deformity. Normal range of motion (pain with full extension). Tenderness present in lateral epicondyle.  ?   Right forearm: Normal.  ?   Cervical back: Neck supple.  ?Skin: ?   General: Skin is warm and dry.  ?   Capillary Refill: Capillary refill takes less than 2 seconds.  ?Neurological:  ?   General: No focal deficit present.  ?   Mental Status: He is alert and oriented to person, place, and time. Mental status is at baseline.  ?   Cranial Nerves: No cranial nerve deficit.  ?   Motor: No weakness.  ?   Coordination: Coordination normal.  ?   Gait: Gait normal.  ?Psychiatric:     ?   Mood and Affect: Mood normal.     ?   Behavior: Behavior normal.     ?   Thought Content: Thought content normal.  ? ? ? ?UC Treatments / Results  ?Labs ?(all labs ordered are listed, but only abnormal results are displayed) ?Labs Reviewed - No data to display ? ?EKG ? ? ?Radiology ?DG Shoulder Right ? ?Result Date: 11/26/2021 ?CLINICAL DATA:  Fall today. Skin tears over the elbow with right shoulder pain. EXAM: RIGHT SHOULDER - 2+ VIEW COMPARISON:  None. FINDINGS: The mineralization and alignment are normal. There is no evidence of acute fracture or dislocation. Mild acromioclavicular and glenohumeral degenerative changes. The subacromial space is preserved. IMPRESSION: No evidence of acute fracture or dislocation. Mild degenerative changes.  Electronically Signed   By: Richardean Sale M.D.   On: 11/26/2021 13:30  ? ?DG Elbow Complete Right ? ?Result Date: 11/26/2021 ?CLINICAL DATA:  Fall, skin tear of elbow EXAM: RIGHT ELBOW - COMPLETE 3+ VIEW COMPARI

## 2021-11-26 NOTE — ED Triage Notes (Signed)
Fell at noon, skin tear over right elbow, right shoulder pain.  ?

## 2021-11-30 ENCOUNTER — Ambulatory Visit (INDEPENDENT_AMBULATORY_CARE_PROVIDER_SITE_OTHER): Payer: Medicare Other | Admitting: Family Medicine

## 2021-11-30 ENCOUNTER — Other Ambulatory Visit: Payer: Self-pay

## 2021-11-30 VITALS — BP 150/80 | HR 80 | Ht 68.0 in | Wt 167.0 lb

## 2021-11-30 DIAGNOSIS — S46811D Strain of other muscles, fascia and tendons at shoulder and upper arm level, right arm, subsequent encounter: Secondary | ICD-10-CM | POA: Diagnosis not present

## 2021-11-30 DIAGNOSIS — M7521 Bicipital tendinitis, right shoulder: Secondary | ICD-10-CM | POA: Diagnosis not present

## 2021-11-30 DIAGNOSIS — M65811 Other synovitis and tenosynovitis, right shoulder: Secondary | ICD-10-CM

## 2021-11-30 DIAGNOSIS — S51011D Laceration without foreign body of right elbow, subsequent encounter: Secondary | ICD-10-CM

## 2021-11-30 MED ORDER — TRAMADOL HCL 50 MG PO TABS: 50.0000 mg | ORAL_TABLET | Freq: Three times a day (TID) | ORAL | 0 refills | Status: AC | PRN

## 2021-11-30 NOTE — Progress Notes (Signed)
Date:  11/30/2021   Name:  Alfred Edwards   DOB:  08/21/1946   MRN:  [id number]   Chief Complaint: Shoulder Pain (S/p fall 4 days ago on concrete- elbow broke fall, but fell on shoulder as well. Can't raise arm/ abduction more than 90 degrees from body)  Shoulder Pain  The pain is present in the left shoulder and left elbow. This is a new problem. The current episode started in the past 7 days. There has been a history of trauma. The problem has been gradually improving (unable to abduct). The quality of the pain is described as aching. The pain is moderate. Associated symptoms include a limited range of motion and stiffness. Pertinent negatives include no fever, numbness or tingling. The symptoms are aggravated by activity. He has tried acetaminophen for the symptoms. The treatment provided moderate relief.   Lab Results  Component Value Date   NA 140 03/05/2021   K 5.0 08/23/2021   CO2 22 03/05/2021   GLUCOSE 127 (H) 03/05/2021   BUN 17 03/05/2021   CREATININE 1.11 03/05/2021   CALCIUM 9.3 03/05/2021   EGFR 70 03/05/2021   GFRNONAA 67 08/24/2020   Lab Results  Component Value Date   CHOL 172 08/24/2020   HDL 82 08/24/2020   LDLCALC 78 08/24/2020   TRIG 64 08/24/2020   CHOLHDL 2.1 08/21/2019   No results found for: TSH Lab Results  Component Value Date   HGBA1C 5.5 11/28/2018   Lab Results  Component Value Date   WBC 7.3 11/28/2018   HGB 12.8 (L) 11/28/2018   HCT 39.7 11/28/2018   MCV 84.8 11/28/2018   PLT 264 11/28/2018   Lab Results  Component Value Date   ALT 18 03/05/2021   AST 14 03/05/2021   ALKPHOS 53 03/05/2021   BILITOT 0.6 03/05/2021   No results found for: 25OHVITD2, 25OHVITD3, VD25OH   Review of Systems  Constitutional:  Negative for fatigue and fever.  Respiratory:  Negative for cough and shortness of breath.   Cardiovascular:  Negative for chest pain.  Musculoskeletal:  Positive for arthralgias, myalgias and stiffness.  Neurological:   Negative for tingling, weakness, numbness and headaches.       No weakness limited range of motion with pain   Patient Active Problem List   Diagnosis Date Noted   CVA (cerebral vascular accident) (HCC) 11/29/2018   TIA (transient ischemic attack) 11/28/2018   Elevated PSA 08/28/2017   Erectile dysfunction 08/28/2017   Benign prostatic hyperplasia with lower urinary tract symptoms 08/28/2017   Venous insufficiency 02/02/2017   Essential hypertension 12/28/2015   Hyperlipidemia 12/28/2015   Nocturia associated with benign prostatic hypertrophy 12/28/2015   Familial multiple lipoprotein-type hyperlipidemia 01/23/2015   Anxiety disorder due to known physiological condition 01/23/2015   Routine general medical examination at a health care facility 01/23/2015   Adjustment reaction 01/23/2015   Essential (primary) hypertension 01/23/2015   Proteinuria 06/01/2012    No Known Allergies  Past Surgical History:  Procedure Laterality Date   COLONOSCOPY  2005, 01/2015   Dr Mechele Collin- cleared for 3 yrs   TONSILLECTOMY     UPPER GASTROINTESTINAL ENDOSCOPY  01/2015   Dr Mechele Collin- erosions on esophagus    Social History   Tobacco Use   Smoking status: Former    Types: Cigarettes    Quit date: 09/13/1971    Years since quitting: 50.2   Smokeless tobacco: Never  Vaping Use   Vaping Use: Never used  Substance Use Topics  Alcohol use: Yes    Alcohol/week: 0.0 standard drinks    Comment: socially   Drug use: No     Medication list has been reviewed and updated.  Current Meds  Medication Sig   Ascorbic Acid (VITAMIN C) 500 MG CAPS Take 1 capsule by mouth 2 (two) times daily.    aspirin (ASPIRIN LOW DOSE) 81 MG EC tablet Take 1 tablet (81 mg total) by mouth daily. Swallow whole.   Cholecalciferol (VITAMIN D3) 2000 UNITS capsule Take 1 capsule by mouth daily.   Coenzyme Q10 (COQ-10) 100 MG CAPS Take 1 capsule by mouth daily.   dutasteride (AVODART) 0.5 MG capsule Take 1 capsule (0.5 mg  total) by mouth daily.   hydrochlorothiazide (MICROZIDE) 12.5 MG capsule Take 1 capsule (12.5 mg total) by mouth daily.   Multiple Vitamins-Minerals (CENTRUM SILVER ADULT 50+ PO) Take 1 tablet by mouth daily.   NIFEdipine (PROCARDIA-XL/NIFEDICAL-XL) 30 MG 24 hr tablet TAKE 1 TABLET BY MOUTH EVERY DAY   Omega-3 Fatty Acids (FISH OIL) 1000 MG CAPS Take 1 capsule (1,000 mg total) by mouth 2 (two) times daily.   simvastatin (ZOCOR) 20 MG tablet TAKE 1 TABLET BY MOUTH EVERY DAY   tadalafil (CIALIS) 20 MG tablet 0.5-1 tab 1 hour prior to intercourse   Zinc 50 MG TABS Take 50 mg by mouth daily.    PHQ 2/9 Scores 10/18/2021 05/25/2021 03/05/2021 10/14/2020  PHQ - 2 Score 0 0 0 0  PHQ- 9 Score 3 0 0 -    GAD 7 : Generalized Anxiety Score 05/25/2021 03/05/2021 08/24/2020 02/21/2020  Nervous, Anxious, on Edge 0 0 0 0  Control/stop worrying 0 0 0 0  Worry too much - different things 0 0 0 0  Trouble relaxing 0 0 0 0  Restless 0 0 0 0  Easily annoyed or irritable 0 0 0 0  Afraid - awful might happen 0 0 0 0  Total GAD 7 Score 0 0 0 0    BP Readings from Last 3 Encounters:  11/30/21 (!) 150/80  11/26/21 (!) 179/84  10/18/21 (!) 146/78    Physical Exam Vitals and nursing note reviewed.  HENT:     Right Ear: Tympanic membrane and ear canal normal.     Left Ear: Tympanic membrane and ear canal normal.     Mouth/Throat:     Mouth: Mucous membranes are moist.  Eyes:     Pupils: Pupils are equal, round, and reactive to light.  Cardiovascular:     Rate and Rhythm: Normal rate.     Heart sounds: No murmur heard.   No friction rub. No gallop.  Pulmonary:     Breath sounds: No wheezing or rhonchi.  Musculoskeletal:     Right shoulder: Tenderness present. No bony tenderness. Decreased range of motion. Decreased strength.     Right upper arm: Tenderness present.     Cervical back: Normal range of motion and neck supple. No tenderness.     Comments: Tender deltoid/supraspinatis/biceps  Skin:     Findings: Wound present. No erythema.     Comments: avu  Neurological:     Mental Status: He is alert.    Wt Readings from Last 3 Encounters:  11/30/21 167 lb (75.8 kg)  10/18/21 167 lb 9.6 oz (76 kg)  10/06/21 170 lb (77.1 kg)    BP (!) 150/80   Pulse 80   Ht 5\' 8"  (1.727 m)   Wt 167 lb (75.8 kg)   BMI 25.39 kg/m  Assessment and Plan: 1. Laceration of skin of right elbow, subsequent encounter New onset.  Recheck.  Subsequent visit for evaluation of wound of a laceration that was glued together on his elbow the patient is currently doing well with it is clean and we will redress and patient will continue to care for as he is currently doing so.  2. Biceps tendinitis of right upper extremity Upon falling patient sustained an injury to his right shoulder.  It sounds like he fell up on his elbow but he jammed his shoulder up into the joint.  Patient has difficulty with abduction and flexion of the right arm.  There is tenderness over the biceps tendon and there is difficulty with speeds maneuver primary problem has been patient being able to rest and getting some pain relief and we will give him tramadol on a as needed basis to bridge until evaluation and treatment of his shoulder. - traMADol (ULTRAM) 50 MG tablet; Take 1 tablet (50 mg total) by mouth every 8 (eight) hours as needed for up to 5 days.  Dispense: 15 tablet; Refill: 0  3. Strain of right deltoid muscle, subsequent encounter New onset.  Persistent.  Stable.  There is tenderness over the deltoid muscle which I suspect sustained a contusion upon falling.  There is tenderness over this but there is no significant swelling.  Patient will continue to use Tylenol on a as needed basis during the day. - traMADol (ULTRAM) 50 MG tablet; Take 1 tablet (50 mg total) by mouth every 8 (eight) hours as needed for up to 5 days.  Dispense: 15 tablet; Refill: 0  4. Right supraspinatus tenosynovitis Chronic.  Controlled.  Stable.  There is  tenderness over the supraspinatus tendon but not the muscle itself.  Patient has difficulty with abduction of his right upper arm.  We will continue Tylenol during the day but patient particularly needs some pain relief at night and tramadol has been prescribed on an as-needed basis every 8 hours but I suspect patient will primarily use at night. - traMADol (ULTRAM) 50 MG tablet; Take 1 tablet (50 mg total) by mouth every 8 (eight) hours as needed for up to 5 days.  Dispense: 15 tablet; Refill: 0

## 2021-12-03 ENCOUNTER — Other Ambulatory Visit: Payer: Self-pay

## 2021-12-03 ENCOUNTER — Encounter: Payer: Self-pay | Admitting: Family Medicine

## 2021-12-03 ENCOUNTER — Ambulatory Visit (INDEPENDENT_AMBULATORY_CARE_PROVIDER_SITE_OTHER): Payer: Medicare Other | Admitting: Family Medicine

## 2021-12-03 VITALS — BP 122/78 | HR 64 | Ht 68.0 in | Wt 170.0 lb

## 2021-12-03 DIAGNOSIS — M7521 Bicipital tendinitis, right shoulder: Secondary | ICD-10-CM | POA: Diagnosis not present

## 2021-12-03 DIAGNOSIS — M7541 Impingement syndrome of right shoulder: Secondary | ICD-10-CM | POA: Diagnosis not present

## 2021-12-03 HISTORY — DX: Bicipital tendinitis, right shoulder: M75.21

## 2021-12-03 MED ORDER — DICLOFENAC SODIUM 50 MG PO TBEC: 50.0000 mg | DELAYED_RELEASE_TABLET | Freq: Two times a day (BID) | ORAL | 0 refills | Status: DC | PRN

## 2021-12-03 NOTE — Progress Notes (Signed)
?  ? ?Primary Care / Sports Medicine Office Visit ? ?Patient Information:  ?Patient ID: [id number] FRYE, male DOB: Jun 24, 1946 Age: 76 y.o. MRN: [id number]  ? ?Alfred Edwards is a pleasant 75 y.o. male presenting with the following: ? ?Chief Complaint  ?Patient presents with  ? Shoulder Pain  ?  Right, still having some limited ROM. Fell on 11/26/2021 on to concrete. Is taking Aleve. Right hand dominate.   ? ? ?Vitals:  ? 12/03/21 1017  ?BP: 122/78  ?Pulse: 64  ?SpO2: 98%  ? ?Vitals:  ? 12/03/21 1017  ?Weight: 170 lb (77.1 kg)  ?Height: '5\' 8"'$  (1.727 m)  ? ?Body mass index is 25.85 kg/m?. ? ?DG Shoulder Right ? ?Result Date: 11/26/2021 ?CLINICAL DATA:  Fall today. Skin tears over the elbow with right shoulder pain. EXAM: RIGHT SHOULDER - 2+ VIEW COMPARISON:  None. FINDINGS: The mineralization and alignment are normal. There is no evidence of acute fracture or dislocation. Mild acromioclavicular and glenohumeral degenerative changes. The subacromial space is preserved. IMPRESSION: No evidence of acute fracture or dislocation. Mild degenerative changes. Electronically Signed   By: Richardean Sale M.D.   On: 11/26/2021 13:30  ? ?DG Elbow Complete Right ? ?Result Date: 11/26/2021 ?CLINICAL DATA:  Fall, skin tear of elbow EXAM: RIGHT ELBOW - COMPLETE 3+ VIEW COMPARISON:  None. FINDINGS: There is no evidence of fracture, dislocation, or joint effusion. There is no evidence of arthropathy or other focal bone abnormality. Soft tissues are unremarkable. IMPRESSION: No fracture or dislocation of the right elbow. No elbow joint effusion to suggest radiographically occult fracture. Electronically Signed   By: Delanna Ahmadi M.D.   On: 11/26/2021 13:30    ? ?Independent interpretation of notes and tests performed by another provider:  ? ?Independent interpretation of right shoulder x-rays dated 11/26/2021 reveals mild glenohumeral osteoarthritis, subacromial space narrowing with cortical roughening consistent with degenerative  changes, associated proximal humeral head degenerative changes noted, no acute osseous process noted ? ?Procedures performed:  ? ?None ? ?Pertinent History, Exam, Impression, and Recommendations:  ? ?Rotator cuff impingement syndrome of right shoulder ?Patient, right-hand-dominant, presents with acute and traumatic onset of left anterior-posterior shoulder pain from fall sustained on 11/26/2021.  He was unloading a car, stumbled, fell backward striking his left elbow which sustained an abrasion (now stable), and associated right shoulder pain.  He did have x-rays which were negative for fracture, was started on meloxicam, self transition to Aleve citing better response, and presents for follow-up today.  He states that his pain is primarily anterior, preferably posterior, nonradiating, aggravated by forward flexion, previously without abduction which proved.  He does give a distant history of similar features roughly 20 years prior which resolved after a few months without treatment. ? ?Examination reveals forward flexion to 15-20 degrees, limited by pain and stiffness, abduction to 45 degrees, limited by stiffness, otherwise symmetric external rotation and extension/internal rotation.  Resisted rotator cuff testing elicits significant pain and weakness around with 4 -/5 strength demonstrated, contralateral 5/5 and painless.  Tenderness along the bicipital groove with positive speeds, Yergason's, and impingement testing.  Glenohumeral loading equivocal. ? ?Given his x-ray findings, mechanism of injury, and findings today, concern for rotator cuff related arthropathy/impingement, secondary by neuropathy.  Cannot exclude an element of adhesive capsulitis given the restriction in motion that this may be confounded by pain. ? ?Plan for transition to diclofenac, home-based range of motion exercises discussed, and close follow-up with low threshold for corticosteroid injection ? ?Acute issue,  independent interpretation  x-rays, Rx management ? ?Biceps tendinitis, right ?See additional assessment(s) for plan details.  ? ?Orders & Medications ?Meds ordered this encounter  ?Medications  ? diclofenac (VOLTAREN) 50 MG EC tablet  ?  Sig: Take 1 tablet (50 mg total) by mouth 2 (two) times daily as needed.  ?  Dispense:  30 tablet  ?  Refill:  0  ? ?No orders of the defined types were placed in this encounter. ?  ? ?Return in about 1 week (around 12/10/2021).  ?  ? ?Montel Culver, MD ? ? Primary Care Sports Medicine ?[facility] ?[place]  ? ?

## 2021-12-03 NOTE — Assessment & Plan Note (Signed)
See additional assessment(s) for plan details. 

## 2021-12-03 NOTE — Patient Instructions (Signed)
-   Transition from Aleve to diclofenac, 1-2 times per day (take with food) ?- Start gentle range of motion exercises as demonstrated (wall crawls, etc.), try and use the arm as naturally as possible with pain being a guide (do not push through pain severely) ?- Can use ice at 20-minute intervals for additional pain and inflammation control ?-Hold from strenuous activity until follow-up ?- Follow-up in 1 week, contact our office for any questions or concerns that may arise between now and then ?

## 2021-12-03 NOTE — Assessment & Plan Note (Signed)
Patient, right-hand-dominant, presents with acute and traumatic onset of left anterior-posterior shoulder pain from fall sustained on 11/26/2021.  He was unloading a car, stumbled, fell backward striking his left elbow which sustained an abrasion (now stable), and associated right shoulder pain.  He did have x-rays which were negative for fracture, was started on meloxicam, self transition to Aleve citing better response, and presents for follow-up today.  He states that his pain is primarily anterior, preferably posterior, nonradiating, aggravated by forward flexion, previously without abduction which proved.  He does give a distant history of similar features roughly 20 years prior which resolved after a few months without treatment. ? ?Examination reveals forward flexion to 15-20 degrees, limited by pain and stiffness, abduction to 45 degrees, limited by stiffness, otherwise symmetric external rotation and extension/internal rotation.  Resisted rotator cuff testing elicits significant pain and weakness around with 4 -/5 strength demonstrated, contralateral 5/5 and painless.  Tenderness along the bicipital groove with positive speeds, Yergason's, and impingement testing.  Glenohumeral loading equivocal. ? ?Given his x-ray findings, mechanism of injury, and findings today, concern for rotator cuff related arthropathy/impingement, secondary by neuropathy.  Cannot exclude an element of adhesive capsulitis given the restriction in motion that this may be confounded by pain. ? ?Plan for transition to diclofenac, home-based range of motion exercises discussed, and close follow-up with low threshold for corticosteroid injection ? ?Acute issue, independent interpretation x-rays, Rx management ?

## 2021-12-10 ENCOUNTER — Ambulatory Visit: Payer: Medicare Other | Admitting: Family Medicine

## 2021-12-15 DIAGNOSIS — M9905 Segmental and somatic dysfunction of pelvic region: Secondary | ICD-10-CM | POA: Diagnosis not present

## 2021-12-15 DIAGNOSIS — M4306 Spondylolysis, lumbar region: Secondary | ICD-10-CM | POA: Diagnosis not present

## 2021-12-15 DIAGNOSIS — M9903 Segmental and somatic dysfunction of lumbar region: Secondary | ICD-10-CM | POA: Diagnosis not present

## 2021-12-15 DIAGNOSIS — M5417 Radiculopathy, lumbosacral region: Secondary | ICD-10-CM | POA: Diagnosis not present

## 2022-01-12 DIAGNOSIS — M9905 Segmental and somatic dysfunction of pelvic region: Secondary | ICD-10-CM | POA: Diagnosis not present

## 2022-01-12 DIAGNOSIS — M9903 Segmental and somatic dysfunction of lumbar region: Secondary | ICD-10-CM | POA: Diagnosis not present

## 2022-01-12 DIAGNOSIS — M4306 Spondylolysis, lumbar region: Secondary | ICD-10-CM | POA: Diagnosis not present

## 2022-01-12 DIAGNOSIS — M5417 Radiculopathy, lumbosacral region: Secondary | ICD-10-CM | POA: Diagnosis not present

## 2022-02-09 DIAGNOSIS — M5417 Radiculopathy, lumbosacral region: Secondary | ICD-10-CM | POA: Diagnosis not present

## 2022-02-09 DIAGNOSIS — M9905 Segmental and somatic dysfunction of pelvic region: Secondary | ICD-10-CM | POA: Diagnosis not present

## 2022-02-09 DIAGNOSIS — M9903 Segmental and somatic dysfunction of lumbar region: Secondary | ICD-10-CM | POA: Diagnosis not present

## 2022-02-09 DIAGNOSIS — M4306 Spondylolysis, lumbar region: Secondary | ICD-10-CM | POA: Diagnosis not present

## 2022-03-09 DIAGNOSIS — M4306 Spondylolysis, lumbar region: Secondary | ICD-10-CM | POA: Diagnosis not present

## 2022-03-09 DIAGNOSIS — M9903 Segmental and somatic dysfunction of lumbar region: Secondary | ICD-10-CM | POA: Diagnosis not present

## 2022-03-09 DIAGNOSIS — M5417 Radiculopathy, lumbosacral region: Secondary | ICD-10-CM | POA: Diagnosis not present

## 2022-03-09 DIAGNOSIS — M9905 Segmental and somatic dysfunction of pelvic region: Secondary | ICD-10-CM | POA: Diagnosis not present

## 2022-03-12 ENCOUNTER — Other Ambulatory Visit: Payer: Self-pay | Admitting: Family Medicine

## 2022-03-12 DIAGNOSIS — E7849 Other hyperlipidemia: Secondary | ICD-10-CM

## 2022-04-04 ENCOUNTER — Ambulatory Visit: Payer: Medicare Other | Admitting: Family Medicine

## 2022-04-05 ENCOUNTER — Ambulatory Visit: Payer: Medicare Other | Admitting: Family Medicine

## 2022-04-05 ENCOUNTER — Other Ambulatory Visit: Payer: Self-pay | Admitting: Family Medicine

## 2022-04-05 DIAGNOSIS — I10 Essential (primary) hypertension: Secondary | ICD-10-CM

## 2022-04-06 DIAGNOSIS — M5417 Radiculopathy, lumbosacral region: Secondary | ICD-10-CM | POA: Diagnosis not present

## 2022-04-06 DIAGNOSIS — M9905 Segmental and somatic dysfunction of pelvic region: Secondary | ICD-10-CM | POA: Diagnosis not present

## 2022-04-06 DIAGNOSIS — M9903 Segmental and somatic dysfunction of lumbar region: Secondary | ICD-10-CM | POA: Diagnosis not present

## 2022-04-06 DIAGNOSIS — M4306 Spondylolysis, lumbar region: Secondary | ICD-10-CM | POA: Diagnosis not present

## 2022-04-11 ENCOUNTER — Encounter: Payer: Self-pay | Admitting: Family Medicine

## 2022-04-11 ENCOUNTER — Ambulatory Visit (INDEPENDENT_AMBULATORY_CARE_PROVIDER_SITE_OTHER): Payer: Medicare Other | Admitting: Family Medicine

## 2022-04-11 VITALS — BP 136/80 | HR 80 | Ht 68.0 in | Wt 169.0 lb

## 2022-04-11 DIAGNOSIS — E7849 Other hyperlipidemia: Secondary | ICD-10-CM

## 2022-04-11 DIAGNOSIS — I10 Essential (primary) hypertension: Secondary | ICD-10-CM | POA: Diagnosis not present

## 2022-04-11 MED ORDER — SIMVASTATIN 20 MG PO TABS: 20.0000 mg | ORAL_TABLET | Freq: Every day | ORAL | 1 refills | Status: DC

## 2022-04-11 MED ORDER — NIFEDIPINE ER OSMOTIC RELEASE 30 MG PO TB24: 30.0000 mg | ORAL_TABLET | Freq: Every day | ORAL | 1 refills | Status: DC

## 2022-04-11 MED ORDER — HYDROCHLOROTHIAZIDE 12.5 MG PO CAPS: 12.5000 mg | ORAL_CAPSULE | Freq: Every day | ORAL | 1 refills | Status: DC

## 2022-04-11 NOTE — Progress Notes (Signed)
Date:  04/11/2022   Name:  Alfred Edwards   DOB:  1946/04/27   MRN:  [id number]   Chief Complaint: Hypertension and Hyperlipidemia  Hypertension This is a chronic problem. The current episode started more than 1 year ago. The problem has been gradually improving since onset. The problem is controlled. Pertinent negatives include no blurred vision, chest pain, malaise/fatigue, palpitations, PND or shortness of breath. There are no associated agents to hypertension. There are no known risk factors for coronary artery disease. Past treatments include calcium channel blockers and diuretics. The current treatment provides moderate improvement. There are no compliance problems.  Hypertensive end-organ damage includes CVA. There is no history of CAD/MI or PVD. There is no history of chronic renal disease, a hypertension causing med or renovascular disease.  Hyperlipidemia This is a chronic problem. The current episode started more than 1 year ago. The problem is controlled. Recent lipid tests were reviewed and are normal. He has no history of chronic renal disease, diabetes, hypothyroidism, liver disease, obesity or nephrotic syndrome. Factors aggravating his hyperlipidemia include thiazides. Pertinent negatives include no chest pain or shortness of breath. Current antihyperlipidemic treatment includes statins. The current treatment provides moderate improvement of lipids. There are no compliance problems.  Risk factors for coronary artery disease include hypertension and dyslipidemia.    Lab Results  Component Value Date   NA 140 03/05/2021   K 5.0 08/23/2021   CO2 22 03/05/2021   GLUCOSE 127 (H) 03/05/2021   BUN 17 03/05/2021   CREATININE 1.11 03/05/2021   CALCIUM 9.3 03/05/2021   EGFR 70 03/05/2021   GFRNONAA 67 08/24/2020   Lab Results  Component Value Date   CHOL 172 08/24/2020   HDL 82 08/24/2020   LDLCALC 78 08/24/2020   TRIG 64 08/24/2020   CHOLHDL 2.1 08/21/2019   No results  found for: "TSH" Lab Results  Component Value Date   HGBA1C 5.5 11/28/2018   Lab Results  Component Value Date   WBC 7.3 11/28/2018   HGB 12.8 (L) 11/28/2018   HCT 39.7 11/28/2018   MCV 84.8 11/28/2018   PLT 264 11/28/2018   Lab Results  Component Value Date   ALT 18 03/05/2021   AST 14 03/05/2021   ALKPHOS 53 03/05/2021   BILITOT 0.6 03/05/2021   No results found for: "25OHVITD2", "25OHVITD3", "VD25OH"   Review of Systems  Constitutional:  Negative for fatigue, fever, malaise/fatigue and unexpected weight change.  Eyes:  Negative for blurred vision.  Respiratory:  Negative for shortness of breath and wheezing.   Cardiovascular:  Negative for chest pain, palpitations, leg swelling and PND.  Gastrointestinal:  Negative for abdominal pain.  Genitourinary:  Negative for difficulty urinating.    Patient Active Problem List   Diagnosis Date Noted   Rotator cuff impingement syndrome of right shoulder 12/03/2021   Biceps tendinitis, right 12/03/2021   CVA (cerebral vascular accident) ([place]) 11/29/2018   TIA (transient ischemic attack) 11/28/2018   Elevated PSA 08/28/2017   Erectile dysfunction 08/28/2017   Benign prostatic hyperplasia with lower urinary tract symptoms 08/28/2017   Venous insufficiency 02/02/2017   Essential hypertension 12/28/2015   Hyperlipidemia 12/28/2015   Nocturia associated with benign prostatic hypertrophy 12/28/2015   Familial multiple lipoprotein-type hyperlipidemia 01/23/2015   Anxiety disorder due to known physiological condition 01/23/2015   Routine general medical examination at a health care facility 01/23/2015   Adjustment reaction 01/23/2015   Essential (primary) hypertension 01/23/2015   Proteinuria 06/01/2012    No  Known Allergies  Past Surgical History:  Procedure Laterality Date   COLONOSCOPY  2005, 01/2015   Dr Vira Agar- cleared for 3 yrs   TONSILLECTOMY     UPPER GASTROINTESTINAL ENDOSCOPY  01/2015   Dr Vira Agar- erosions on  esophagus    Social History   Tobacco Use   Smoking status: Former    Types: Cigarettes    Quit date: 09/13/1971    Years since quitting: 50.6   Smokeless tobacco: Never  Vaping Use   Vaping Use: Never used  Substance Use Topics   Alcohol use: Yes    Alcohol/week: 0.0 standard drinks of alcohol    Comment: socially   Drug use: No     Medication list has been reviewed and updated.  Current Meds  Medication Sig   Ascorbic Acid (VITAMIN C) 500 MG CAPS Take 1 capsule by mouth 2 (two) times daily.    aspirin (ASPIRIN LOW DOSE) 81 MG EC tablet Take 1 tablet (81 mg total) by mouth daily. Swallow whole.   Cholecalciferol (VITAMIN D3) 2000 UNITS capsule Take 1 capsule by mouth daily.   Coenzyme Q10 (COQ-10) 100 MG CAPS Take 1 capsule by mouth daily.   dutasteride (AVODART) 0.5 MG capsule Take 1 capsule (0.5 mg total) by mouth daily.   hydrochlorothiazide (MICROZIDE) 12.5 MG capsule TAKE 1 CAPSULE BY MOUTH EVERY DAY   Multiple Vitamins-Minerals (CENTRUM SILVER ADULT 50+ PO) Take 1 tablet by mouth daily.   NIFEdipine (PROCARDIA-XL/NIFEDICAL-XL) 30 MG 24 hr tablet TAKE 1 TABLET BY MOUTH EVERY DAY   Omega-3 Fatty Acids (FISH OIL) 1000 MG CAPS Take 1 capsule (1,000 mg total) by mouth 2 (two) times daily.   simvastatin (ZOCOR) 20 MG tablet TAKE 1 TABLET BY MOUTH EVERY DAY   tadalafil (CIALIS) 20 MG tablet 0.5-1 tab 1 hour prior to intercourse   Zinc 50 MG TABS Take 50 mg by mouth daily.       04/11/2022    1:22 PM 12/03/2021   10:20 AM 05/25/2021    9:29 AM 03/05/2021    1:27 PM  GAD 7 : Generalized Anxiety Score  Nervous, Anxious, on Edge 0 0 0 0  Control/stop worrying 0 0 0 0  Worry too much - different things 0 0 0 0  Trouble relaxing 0 0 0 0  Restless 0 0 0 0  Easily annoyed or irritable 0 0 0 0  Afraid - awful might happen 0 0 0 0  Total GAD 7 Score 0 0 0 0  Anxiety Difficulty Not difficult at all          04/11/2022    1:22 PM 12/03/2021   10:20 AM 10/18/2021    8:49 AM   Depression screen PHQ 2/9  Decreased Interest 0 0 0  Down, Depressed, Hopeless 0 0 0  PHQ - 2 Score 0 0 0  Altered sleeping 0 0 1  Tired, decreased energy 0 0 1  Change in appetite 0 0 0  Feeling bad or failure about yourself  0 0 0  Trouble concentrating 0 0 1  Moving slowly or fidgety/restless 0 0 0  Suicidal thoughts 0 0 0  PHQ-9 Score 0 0 3  Difficult doing work/chores Not difficult at all Not difficult at all Not difficult at all    BP Readings from Last 3 Encounters:  04/11/22 136/80  12/03/21 122/78  11/30/21 (!) 150/80    Physical Exam Vitals and nursing note reviewed.  HENT:     Head: Normocephalic.  Right Ear: Tympanic membrane and external ear normal.     Left Ear: Tympanic membrane and external ear normal.     Nose: Nose normal.     Mouth/Throat:     Mouth: Mucous membranes are moist.  Eyes:     General: No scleral icterus.       Right eye: No discharge.        Left eye: No discharge.     Conjunctiva/sclera: Conjunctivae normal.     Pupils: Pupils are equal, round, and reactive to light.  Neck:     Thyroid: No thyromegaly.     Vascular: No JVD.     Trachea: No tracheal deviation.  Cardiovascular:     Rate and Rhythm: Normal rate and regular rhythm.     Pulses: Normal pulses.     Heart sounds: Normal heart sounds, S1 normal and S2 normal. No murmur heard.    No systolic murmur is present.     No diastolic murmur is present.     No friction rub. No gallop. No S3 or S4 sounds.  Pulmonary:     Effort: No respiratory distress.     Breath sounds: Normal breath sounds. No wheezing or rales.  Abdominal:     General: Bowel sounds are normal.     Palpations: Abdomen is soft. There is no mass.     Tenderness: There is no abdominal tenderness. There is no guarding or rebound.  Musculoskeletal:        General: No tenderness. Normal range of motion.     Cervical back: Normal range of motion and neck supple.  Lymphadenopathy:     Cervical: No cervical  adenopathy.  Skin:    General: Skin is warm.     Findings: No rash.  Neurological:     Mental Status: He is alert and oriented to person, place, and time.     Cranial Nerves: No cranial nerve deficit.     Deep Tendon Reflexes: Reflexes are normal and symmetric.     Wt Readings from Last 3 Encounters:  04/11/22 169 lb (76.7 kg)  12/03/21 170 lb (77.1 kg)  11/30/21 167 lb (75.8 kg)    BP 136/80   Pulse 80   Ht _0  (1.727 m)   Wt 169 lb (76.7 kg)   BMI 25.70 kg/m   Assessment and Plan:  1. Essential (primary) hypertension Chronic.  Controlled.  Stable.  Blood pressure 136/80.  Asymptomatic.  Tolerating medications well.  Continue hydrochlorothiazide 12.5 mg once a day.  And chest pain XL 30 mg once a day.  We will check CMP for electrolytes and GFR.  We will recheck in 6 months. - hydrochlorothiazide (MICROZIDE) 12.5 MG capsule; Take 1 capsule (12.5 mg total) by mouth daily.  Dispense: 90 capsule; Refill: 1 - NIFEdipine (PROCARDIA-XL/NIFEDICAL-XL) 30 MG 24 hr tablet; Take 1 tablet (30 mg total) by mouth daily.  Dispense: 90 tablet; Refill: 1 - Comprehensive Metabolic Panel (CMET)  2. Familial multiple lipoprotein-type hyperlipidemia Chronic.  Controlled.  Stable.  Continue simvastatin 20 mg once a day.  We will recheck lipid panel. - simvastatin (ZOCOR) 20 MG tablet; Take 1 tablet (20 mg total) by mouth daily.  Dispense: 90 tablet; Refill: 1 - Lipid Panel With LDL/HDL Ratio

## 2022-04-12 LAB — COMPREHENSIVE METABOLIC PANEL
ALT: 14 IU/L (ref 0–44)
AST: 14 IU/L (ref 0–40)
Albumin/Globulin Ratio: 1.3 (ref 1.2–2.2)
Albumin: 4.1 g/dL (ref 3.8–4.8)
Alkaline Phosphatase: 53 IU/L (ref 44–121)
BUN/Creatinine Ratio: 11 (ref 10–24)
BUN: 13 mg/dL (ref 8–27)
Bilirubin Total: 0.8 mg/dL (ref 0.0–1.2)
CO2: 25 mmol/L (ref 20–29)
Calcium: 9.4 mg/dL (ref 8.6–10.2)
Chloride: 103 mmol/L (ref 96–106)
Creatinine, Ser: 1.15 mg/dL (ref 0.76–1.27)
Globulin, Total: 3.2 g/dL (ref 1.5–4.5)
Glucose: 92 mg/dL (ref 70–99)
Potassium: 4.9 mmol/L (ref 3.5–5.2)
Sodium: 141 mmol/L (ref 134–144)
Total Protein: 7.3 g/dL (ref 6.0–8.5)
eGFR: 67 mL/min/{1.73_m2} (ref >59)

## 2022-04-12 LAB — LIPID PANEL WITH LDL/HDL RATIO
Cholesterol, Total: 186 mg/dL (ref 100–199)
HDL: 79 mg/dL (ref >39)
LDL Chol Calc (NIH): 90 mg/dL (ref 0–99)
LDL/HDL Ratio: 1.1 ratio (ref 0.0–3.6)
Triglycerides: 95 mg/dL (ref 0–149)
VLDL Cholesterol Cal: 17 mg/dL (ref 5–40)

## 2022-04-29 ENCOUNTER — Other Ambulatory Visit: Payer: Self-pay | Admitting: Family Medicine

## 2022-04-29 DIAGNOSIS — I10 Essential (primary) hypertension: Secondary | ICD-10-CM

## 2022-05-04 DIAGNOSIS — Z09 Encounter for follow-up examination after completed treatment for conditions other than malignant neoplasm: Secondary | ICD-10-CM | POA: Diagnosis not present

## 2022-05-04 DIAGNOSIS — D225 Melanocytic nevi of trunk: Secondary | ICD-10-CM | POA: Diagnosis not present

## 2022-05-04 DIAGNOSIS — L821 Other seborrheic keratosis: Secondary | ICD-10-CM | POA: Diagnosis not present

## 2022-05-04 DIAGNOSIS — L578 Other skin changes due to chronic exposure to nonionizing radiation: Secondary | ICD-10-CM | POA: Diagnosis not present

## 2022-05-04 DIAGNOSIS — L570 Actinic keratosis: Secondary | ICD-10-CM | POA: Diagnosis not present

## 2022-05-04 DIAGNOSIS — Z86018 Personal history of other benign neoplasm: Secondary | ICD-10-CM | POA: Diagnosis not present

## 2022-05-11 DIAGNOSIS — M9905 Segmental and somatic dysfunction of pelvic region: Secondary | ICD-10-CM | POA: Diagnosis not present

## 2022-05-11 DIAGNOSIS — M5417 Radiculopathy, lumbosacral region: Secondary | ICD-10-CM | POA: Diagnosis not present

## 2022-05-11 DIAGNOSIS — M4306 Spondylolysis, lumbar region: Secondary | ICD-10-CM | POA: Diagnosis not present

## 2022-05-11 DIAGNOSIS — M9903 Segmental and somatic dysfunction of lumbar region: Secondary | ICD-10-CM | POA: Diagnosis not present

## 2022-06-03 ENCOUNTER — Other Ambulatory Visit: Payer: Self-pay | Admitting: Family Medicine

## 2022-06-03 DIAGNOSIS — E7849 Other hyperlipidemia: Secondary | ICD-10-CM

## 2022-06-03 NOTE — Telephone Encounter (Signed)
Requested by interface surescripts. Requested too soon . Last refill 03/13/22. Requested Prescriptions  Refused Prescriptions Disp Refills  . simvastatin (ZOCOR) 20 MG tablet [Pharmacy Med Name: SIMVASTATIN 20 MG TABLET] 90 tablet 1    Sig: TAKE 1 TABLET BY MOUTH EVERY DAY     Cardiovascular:  Antilipid - Statins Failed - 06/03/2022  8:31 AM      Failed - Lipid Panel in normal range within the last 12 months    Cholesterol, Total  Date Value Ref Range Status  04/11/2022 186 100 - 199 mg/dL Final   LDL Chol Calc (NIH)  Date Value Ref Range Status  04/11/2022 90 0 - 99 mg/dL Final   HDL  Date Value Ref Range Status  04/11/2022 79 >39 mg/dL Final   Triglycerides  Date Value Ref Range Status  04/11/2022 95 0 - 149 mg/dL Final         Passed - Patient is not pregnant      Passed - Valid encounter within last 12 months    Recent Outpatient Visits          1 month ago Essential (primary) hypertension   [facility] [place], [place], MD   6 months ago Rotator cuff impingement syndrome of right shoulder   [facility] at [place], Earley Abide, MD   6 months ago Laceration of skin of right elbow, subsequent encounter   [facility] at [place], [place], MD   8 months ago Essential (primary) hypertension   [facility] at [place], [place], MD   9 months ago Essential (primary) hypertension   [facility] at [facility], MD      Future Appointments            In 3 months Stoioff, Ronda Fairly, MD [place]   In 4 months Juline Patch, MD [place] [facility] at [facility], [facility]

## 2022-06-08 DIAGNOSIS — M9903 Segmental and somatic dysfunction of lumbar region: Secondary | ICD-10-CM | POA: Diagnosis not present

## 2022-06-08 DIAGNOSIS — M5417 Radiculopathy, lumbosacral region: Secondary | ICD-10-CM | POA: Diagnosis not present

## 2022-06-08 DIAGNOSIS — M9905 Segmental and somatic dysfunction of pelvic region: Secondary | ICD-10-CM | POA: Diagnosis not present

## 2022-06-08 DIAGNOSIS — M4306 Spondylolysis, lumbar region: Secondary | ICD-10-CM | POA: Diagnosis not present

## 2022-06-22 ENCOUNTER — Telehealth: Payer: Self-pay | Admitting: Urology

## 2022-06-22 ENCOUNTER — Telehealth: Payer: Self-pay | Admitting: *Deleted

## 2022-06-22 DIAGNOSIS — R972 Elevated prostate specific antigen [PSA]: Secondary | ICD-10-CM

## 2022-06-22 NOTE — Telephone Encounter (Signed)
Patient came in office and stated he wanted his PSA LEVELS checked and wanted to schedule a lab to get this done, I let patient know I would send a message. Patient has an upcoming appt in January, 09/28/2022.

## 2022-06-22 NOTE — Telephone Encounter (Signed)
Patient would like to do his prostate mri .

## 2022-06-23 ENCOUNTER — Other Ambulatory Visit: Payer: Self-pay | Admitting: *Deleted

## 2022-06-23 DIAGNOSIS — R972 Elevated prostate specific antigen [PSA]: Secondary | ICD-10-CM

## 2022-06-23 NOTE — Addendum Note (Signed)
Addended by: Abbie Sons on: 06/23/2022 07:47 AM   Modules accepted: Orders

## 2022-06-24 ENCOUNTER — Other Ambulatory Visit: Payer: Medicare Other

## 2022-06-24 DIAGNOSIS — R972 Elevated prostate specific antigen [PSA]: Secondary | ICD-10-CM

## 2022-06-25 LAB — PSA: Prostate Specific Ag, Serum: 2.7 ng/mL (ref 0.0–4.0)

## 2022-06-27 ENCOUNTER — Telehealth: Payer: Self-pay | Admitting: *Deleted

## 2022-06-27 NOTE — Telephone Encounter (Signed)
Notified patient as instructed, patient pleased. Discussed follow-up appointments, patient agrees  

## 2022-06-27 NOTE — Telephone Encounter (Signed)
-----   Message from Abbie Sons, MD sent at 06/26/2022  1:17 PM EDT ----- Repeat PSA back to baseline at 2.7.  Cancel MRI order since PSA back to baseline.  Keep scheduled follow-up January 2024

## 2022-07-01 ENCOUNTER — Other Ambulatory Visit: Payer: Self-pay | Admitting: Family Medicine

## 2022-07-01 DIAGNOSIS — I10 Essential (primary) hypertension: Secondary | ICD-10-CM

## 2022-07-01 NOTE — Telephone Encounter (Signed)
Requested Prescriptions  Pending Prescriptions Disp Refills  . hydrochlorothiazide (MICROZIDE) 12.5 MG capsule [Pharmacy Med Name: HYDROCHLOROTHIAZIDE 12.5 MG CP] 90 capsule 0    Sig: TAKE 1 CAPSULE BY MOUTH EVERY DAY     Cardiovascular: Diuretics - Thiazide Passed - 07/01/2022  1:36 AM      Passed - Cr in normal range and within 180 days    Creatinine, Ser  Date Value Ref Range Status  04/11/2022 1.15 0.76 - 1.27 mg/dL Final         Passed - K in normal range and within 180 days    Potassium  Date Value Ref Range Status  04/11/2022 4.9 3.5 - 5.2 mmol/L Final         Passed - Na in normal range and within 180 days    Sodium  Date Value Ref Range Status  04/11/2022 141 134 - 144 mmol/L Final         Passed - Last BP in normal range    BP Readings from Last 1 Encounters:  04/11/22 136/80         Passed - Valid encounter within last 6 months    Recent Outpatient Visits          2 months ago Essential (primary) hypertension   [facility] at [place], Deanna C, MD   7 months ago Rotator cuff impingement syndrome of right shoulder   [facility] at [place], Earley Abide, MD   7 months ago Laceration of skin of right elbow, subsequent encounter   [facility] at [place], [place], MD   8 months ago Essential (primary) hypertension   [facility] at [place], [place], MD   10 months ago Essential (primary) hypertension   [facility] at [facility], MD      Future Appointments            In 2 months Stoioff, Ronda Fairly, MD [place]   In 3 months Juline Patch, MD [facility] at [facility], [facility]

## 2022-07-06 DIAGNOSIS — M4306 Spondylolysis, lumbar region: Secondary | ICD-10-CM | POA: Diagnosis not present

## 2022-07-06 DIAGNOSIS — M5417 Radiculopathy, lumbosacral region: Secondary | ICD-10-CM | POA: Diagnosis not present

## 2022-07-06 DIAGNOSIS — M9905 Segmental and somatic dysfunction of pelvic region: Secondary | ICD-10-CM | POA: Diagnosis not present

## 2022-07-06 DIAGNOSIS — M9903 Segmental and somatic dysfunction of lumbar region: Secondary | ICD-10-CM | POA: Diagnosis not present

## 2022-07-21 ENCOUNTER — Other Ambulatory Visit: Payer: Self-pay | Admitting: Family Medicine

## 2022-07-21 DIAGNOSIS — I10 Essential (primary) hypertension: Secondary | ICD-10-CM

## 2022-07-21 NOTE — Telephone Encounter (Signed)
Requested Prescriptions  Pending Prescriptions Disp Refills   NIFEdipine (PROCARDIA-XL/NIFEDICAL-XL) 30 MG 24 hr tablet [Pharmacy Med Name: NIFEDIPINE ER 30 MG TABLET] 90 tablet 0    Sig: TAKE 1 TABLET BY MOUTH EVERY DAY     Cardiovascular: Calcium Channel Blockers 2 Passed - 07/21/2022 10:31 AM      Passed - Last BP in normal range    BP Readings from Last 1 Encounters:  04/11/22 136/80         Passed - Last Heart Rate in normal range    Pulse Readings from Last 1 Encounters:  04/11/22 80         Passed - Valid encounter within last 6 months    Recent Outpatient Visits           3 months ago Essential (primary) hypertension   [facility] at [place], Deanna C, MD   7 months ago Rotator cuff impingement syndrome of right shoulder   [facility] at [place], Earley Abide, MD   7 months ago Laceration of skin of right elbow, subsequent encounter   [place] at [place], [place], MD   9 months ago Essential (primary) hypertension   [facility] at [place], [place], MD   11 months ago Essential (primary) hypertension   [facility] at [facility], MD       Future Appointments             In 2 months Stoioff, Ronda Fairly, MD [place]   In 2 months Juline Patch, MD [place] and [facility] Medicine at [facility], [facility]

## 2022-08-10 DIAGNOSIS — M4306 Spondylolysis, lumbar region: Secondary | ICD-10-CM | POA: Diagnosis not present

## 2022-08-10 DIAGNOSIS — M9903 Segmental and somatic dysfunction of lumbar region: Secondary | ICD-10-CM | POA: Diagnosis not present

## 2022-08-10 DIAGNOSIS — M5417 Radiculopathy, lumbosacral region: Secondary | ICD-10-CM | POA: Diagnosis not present

## 2022-08-10 DIAGNOSIS — M9905 Segmental and somatic dysfunction of pelvic region: Secondary | ICD-10-CM | POA: Diagnosis not present

## 2022-08-19 ENCOUNTER — Other Ambulatory Visit: Payer: Self-pay | Admitting: Family Medicine

## 2022-08-19 DIAGNOSIS — E7849 Other hyperlipidemia: Secondary | ICD-10-CM

## 2022-08-29 ENCOUNTER — Ambulatory Visit: Payer: Self-pay | Admitting: *Deleted

## 2022-08-29 ENCOUNTER — Ambulatory Visit (INDEPENDENT_AMBULATORY_CARE_PROVIDER_SITE_OTHER): Payer: Medicare Other | Admitting: Family Medicine

## 2022-08-29 ENCOUNTER — Other Ambulatory Visit: Payer: Self-pay

## 2022-08-29 VITALS — BP 128/62 | HR 65 | Temp 97.6°F | Ht 68.0 in | Wt 172.0 lb

## 2022-08-29 DIAGNOSIS — J101 Influenza due to other identified influenza virus with other respiratory manifestations: Secondary | ICD-10-CM

## 2022-08-29 DIAGNOSIS — R051 Acute cough: Secondary | ICD-10-CM

## 2022-08-29 LAB — POCT INFLUENZA A/B
Influenza A, POC: NEGATIVE
Influenza B, POC: NEGATIVE

## 2022-08-29 LAB — POC COVID19 BINAXNOW: SARS Coronavirus 2 Ag: NEGATIVE

## 2022-08-29 MED ORDER — TAMSULOSIN HCL 0.4 MG PO CAPS: 0.4000 mg | ORAL_CAPSULE | Freq: Every day | ORAL | 3 refills | Status: DC

## 2022-08-29 MED ORDER — TAMSULOSIN HCL 0.4 MG PO CAPS: 0.4000 mg | ORAL_CAPSULE | Freq: Every day | ORAL | 0 refills | Status: DC

## 2022-08-29 MED ORDER — OSELTAMIVIR PHOSPHATE 75 MG PO CAPS: 75.0000 mg | ORAL_CAPSULE | Freq: Two times a day (BID) | ORAL | 0 refills | Status: DC

## 2022-08-29 NOTE — Addendum Note (Signed)
Addended by: Juline Patch on: 08/29/2022 03:06 PM   Modules accepted: Orders

## 2022-08-29 NOTE — Telephone Encounter (Signed)
Summary: Pt requests Rx for Tamiflu be sent to his pharmacy   Pt requests that a Rx for Tamiflu be [id number] [place] [address]          Chief Complaint: requesting tamiflu due to exposure to type A influenza from room mate Symptoms: cough and sore throat. Non productive  cough. Room mate tested 08/26/22 for influenza A.  Frequency: last night sx started Pertinent Negatives: Patient denies chest pain no difficulty breathing, no fever. No runny nose no chills.  Disposition: '[]'$ ED /'[]'$ Urgent Care (no appt availability in office) / '[x]'$ Appointment(In office/virtual)/ '[]'$  [place] [facility]/ '[]'$ Home Care/ '[]'$ Refused Recommended Disposition /'[]'$ [place] [facility]/ '[]'$  Follow-up with PCP Additional Notes:   Please advise. Exposure to influenza type A. Please advise if VV can be done instead of OV due to high risk exposure . Please contact patient if VV can be made or if patient needs appt. Please advise patient regarding precautions coming in for OV. #[phone]              Reason for Disposition  [1] Cough AND [2] within 14 days of Avian Flu EXPOSURE  Answer Assessment - Initial Assessment Questions 1. PLACE of EXPOSURE: "Where were you when you were exposed to Avian Flu?" (e.g., city, state, country)     [place]. TYPE of EXPOSURE: "How were you exposed?" (e.g., poultry)     Room mate dx influenza A 3. DATE of EXPOSURE: "When did the exposure occur?" (e.g., days)     08/26/22 4. SYMPTOMS: "Do you have any symptoms?" (e.g., fever, cough, breathing difficulty)     Cough, sore throat  5. ONSET of SYMPTOMS: "When did your symptoms start?"     Last night  Protocols used: Avian Influenza Exposure-A-AH

## 2022-08-29 NOTE — Progress Notes (Signed)
Date:  08/29/2022   Name:  Alfred Edwards   DOB:  [birth year]   MRN:  [id number]   Chief Complaint: Cough (Cough last night, nose running. Person you live with has had FLU for the last 5 days and taking Tamiflu.)  Cough This is a new problem. The current episode started yesterday. The problem has been gradually worsening. The problem occurs every few minutes. The cough is Non-productive. Associated symptoms include nasal congestion and rhinorrhea. Pertinent negatives include no chest pain, chills, ear congestion, ear pain, fever, myalgias, postnasal drip, sore throat, shortness of breath or wheezing. Nothing aggravates the symptoms. He has tried nothing for the symptoms.    Lab Results  Component Value Date   NA 141 04/11/2022   K 4.9 04/11/2022   CO2 25 04/11/2022   GLUCOSE 92 04/11/2022   BUN 13 04/11/2022   CREATININE 1.15 04/11/2022   CALCIUM 9.4 04/11/2022   EGFR 67 04/11/2022   GFRNONAA 67 08/24/2020   Lab Results  Component Value Date   CHOL 186 04/11/2022   HDL 79 04/11/2022   LDLCALC 90 04/11/2022   TRIG 95 04/11/2022   CHOLHDL 2.1 08/21/2019   No results found for: "TSH" Lab Results  Component Value Date   HGBA1C 5.5 11/28/2018   Lab Results  Component Value Date   WBC 7.3 11/28/2018   HGB 12.8 (L) 11/28/2018   HCT 39.7 11/28/2018   MCV 84.8 11/28/2018   PLT 264 11/28/2018   Lab Results  Component Value Date   ALT 14 04/11/2022   AST 14 04/11/2022   ALKPHOS 53 04/11/2022   BILITOT 0.8 04/11/2022   No results found for: "25OHVITD2", "25OHVITD3", "VD25OH"   Review of Systems  Constitutional:  Negative for chills and fever.  HENT:  Positive for rhinorrhea and sneezing. Negative for ear pain, postnasal drip, sinus pressure and sore throat.   Respiratory:  Positive for cough. Negative for shortness of breath and wheezing.   Cardiovascular:  Negative for chest pain and palpitations.  Musculoskeletal:  Negative for myalgias.    Patient Active  Problem List   Diagnosis Date Noted   Rotator cuff impingement syndrome of right shoulder 12/03/2021   Biceps tendinitis, right 12/03/2021   CVA (cerebral vascular accident) ([place]) 11/29/2018   TIA (transient ischemic attack) 11/28/2018   Elevated PSA 08/28/2017   Erectile dysfunction 08/28/2017   Benign prostatic hyperplasia with lower urinary tract symptoms 08/28/2017   Venous insufficiency 02/02/2017   Essential hypertension 12/28/2015   Hyperlipidemia 12/28/2015   Nocturia associated with benign prostatic hypertrophy 12/28/2015   Familial multiple lipoprotein-type hyperlipidemia 01/23/2015   Anxiety disorder due to known physiological condition 01/23/2015   Routine general medical examination at a health care facility 01/23/2015   Adjustment reaction 01/23/2015   Essential (primary) hypertension 01/23/2015   Proteinuria 06/01/2012    No Known Allergies  Past Surgical History:  Procedure Laterality Date   COLONOSCOPY  2005, 01/2015   Dr Vira Agar- cleared for 3 yrs   TONSILLECTOMY     UPPER GASTROINTESTINAL ENDOSCOPY  01/2015   Dr Vira Agar- erosions on esophagus    Social History   Tobacco Use   Smoking status: Former    Types: Cigarettes    Quit date: 09/13/1971    Years since quitting: 50.9   Smokeless tobacco: Never  Vaping Use   Vaping Use: Never used  Substance Use Topics   Alcohol use: Yes    Alcohol/week: 0.0 standard drinks of alcohol    Comment:  socially   Drug use: No     Medication list has been reviewed and updated.  Current Meds  Medication Sig   Ascorbic Acid (VITAMIN C) 500 MG CAPS Take 1 capsule by mouth 2 (two) times daily.    aspirin (ASPIRIN LOW DOSE) 81 MG EC tablet Take 1 tablet (81 mg total) by mouth daily. Swallow whole.   Cholecalciferol (VITAMIN D3) 2000 UNITS capsule Take 1 capsule by mouth daily.   Coenzyme Q10 (COQ-10) 100 MG CAPS Take 1 capsule by mouth daily.   dutasteride (AVODART) 0.5 MG capsule Take 1 capsule (0.5 mg total) by  mouth daily.   hydrochlorothiazide (MICROZIDE) 12.5 MG capsule TAKE 1 CAPSULE BY MOUTH EVERY DAY   Multiple Vitamins-Minerals (CENTRUM SILVER ADULT 50+ PO) Take 1 tablet by mouth daily.   NIFEdipine (PROCARDIA-XL/NIFEDICAL-XL) 30 MG 24 hr tablet TAKE 1 TABLET BY MOUTH EVERY DAY   Omega-3 Fatty Acids (FISH OIL) 1000 MG CAPS Take 1 capsule (1,000 mg total) by mouth 2 (two) times daily.   simvastatin (ZOCOR) 20 MG tablet TAKE 1 TABLET BY MOUTH EVERY DAY   tadalafil (CIALIS) 20 MG tablet 0.5-1 tab 1 hour prior to intercourse   Zinc 50 MG TABS Take 50 mg by mouth daily.       08/29/2022    1:41 PM 04/11/2022    1:22 PM 12/03/2021   10:20 AM 05/25/2021    9:29 AM  GAD 7 : Generalized Anxiety Score  Nervous, Anxious, on Edge 0 0 0 0  Control/stop worrying 0 0 0 0  Worry too much - different things 0 0 0 0  Trouble relaxing 0 0 0 0  Restless 0 0 0 0  Easily annoyed or irritable 0 0 0 0  Afraid - awful might happen 0 0 0 0  Total GAD 7 Score 0 0 0 0  Anxiety Difficulty Not difficult at all Not difficult at all         08/29/2022    1:41 PM 04/11/2022    1:22 PM 12/03/2021   10:20 AM  Depression screen PHQ 2/9  Decreased Interest 0 0 0  Down, Depressed, Hopeless 0 0 0  PHQ - 2 Score 0 0 0  Altered sleeping 0 0 0  Tired, decreased energy 0 0 0  Change in appetite 0 0 0  Feeling bad or failure about yourself  0 0 0  Trouble concentrating 0 0 0  Moving slowly or fidgety/restless 0 0 0  Suicidal thoughts 0 0 0  PHQ-9 Score 0 0 0  Difficult doing work/chores Not difficult at all Not difficult at all Not difficult at all    BP Readings from Last 3 Encounters:  08/29/22 128/62  04/11/22 136/80  12/03/21 122/78    Physical Exam Vitals and nursing note reviewed.  HENT:     Head: Normocephalic.     Right Ear: Tympanic membrane and external ear normal.     Left Ear: Tympanic membrane and external ear normal.     Nose: Nose normal. No congestion or rhinorrhea.     Mouth/Throat:      Mouth: Mucous membranes are moist.     Pharynx: Oropharynx is clear.  Eyes:     General: No scleral icterus.       Right eye: No discharge.        Left eye: No discharge.     Conjunctiva/sclera: Conjunctivae normal.     Pupils: Pupils are equal, round, and reactive to light.  Neck:     Thyroid: No thyromegaly.     Vascular: No JVD.     Trachea: No tracheal deviation.  Cardiovascular:     Rate and Rhythm: Normal rate and regular rhythm.     Heart sounds: Normal heart sounds. No murmur heard.    No friction rub. No gallop.  Pulmonary:     Effort: No respiratory distress.     Breath sounds: Normal breath sounds. No wheezing or rales.  Abdominal:     General: Bowel sounds are normal.     Palpations: Abdomen is soft. There is no mass.     Tenderness: There is no abdominal tenderness. There is no guarding or rebound.  Musculoskeletal:        General: No tenderness. Normal range of motion.     Cervical back: Normal range of motion and neck supple.  Lymphadenopathy:     Cervical: No cervical adenopathy.  Skin:    General: Skin is warm.     Findings: No rash.  Neurological:     Mental Status: He is alert.     Wt Readings from Last 3 Encounters:  08/29/22 172 lb (78 kg)  04/11/22 169 lb (76.7 kg)  12/03/21 170 lb (77.1 kg)    BP 128/62   Pulse 65   Temp 97.6 F (36.4 C) (Oral)   Ht _0  (1.727 m)   Wt 172 lb (78 kg)   SpO2 97%   BMI 26.15 kg/m   Assessment and Plan:  1. Acute cough New onset.  Episodic.  Patient recently exposed to known influenza A over the weekend. - POC COVID-19 BinaxNow - POCT Influenza A/B  2. Influenza A Given exposure and symptomatology with relatively close contact we will treat as influenza A with Tamiflu 0.4 mg 1 twice a day for 5 days. - tamsulosin (FLOMAX) 0.4 MG CAPS capsule; Take 1 capsule (0.4 mg total) by mouth daily.  Dispense: 30 capsule; Refill: 3    Otilio Miu, MD

## 2022-08-30 ENCOUNTER — Ambulatory Visit: Payer: Medicare Other | Admitting: Family Medicine

## 2022-09-14 DIAGNOSIS — M9903 Segmental and somatic dysfunction of lumbar region: Secondary | ICD-10-CM | POA: Diagnosis not present

## 2022-09-14 DIAGNOSIS — M4306 Spondylolysis, lumbar region: Secondary | ICD-10-CM | POA: Diagnosis not present

## 2022-09-14 DIAGNOSIS — M5417 Radiculopathy, lumbosacral region: Secondary | ICD-10-CM | POA: Diagnosis not present

## 2022-09-14 DIAGNOSIS — M9905 Segmental and somatic dysfunction of pelvic region: Secondary | ICD-10-CM | POA: Diagnosis not present

## 2022-09-25 ENCOUNTER — Other Ambulatory Visit: Payer: Self-pay | Admitting: Family Medicine

## 2022-09-25 DIAGNOSIS — I10 Essential (primary) hypertension: Secondary | ICD-10-CM

## 2022-09-28 ENCOUNTER — Ambulatory Visit: Payer: Medicare Other | Admitting: Urology

## 2022-09-28 ENCOUNTER — Encounter: Payer: Self-pay | Admitting: Urology

## 2022-09-28 VITALS — BP 195/83 | HR 68 | Ht 68.0 in | Wt 170.0 lb

## 2022-09-28 DIAGNOSIS — R972 Elevated prostate specific antigen [PSA]: Secondary | ICD-10-CM | POA: Diagnosis not present

## 2022-09-28 DIAGNOSIS — N401 Enlarged prostate with lower urinary tract symptoms: Secondary | ICD-10-CM

## 2022-09-28 MED ORDER — DUTASTERIDE 0.5 MG PO CAPS: 0.5000 mg | ORAL_CAPSULE | Freq: Every day | ORAL | 3 refills | Status: DC

## 2022-09-28 NOTE — Progress Notes (Signed)
09/28/2022 11:21 AM   Alfred Edwards 09-24-1945 [id number]  Referring provider: Juline Patch, MD [address] [place],  [place] [address]  Chief Complaint  Patient presents with   Elevated PSA    Urologic history: 1.  Elevated PSA Prostate biopsy 2008; PSA 6.2; 52 g gland; benign pathology   2.  BPH with lower urinary tract symptoms Dutasteride 3 times weekly   3.  Erectile dysfunction Generic tadalafil 20 mg  HPI: 77 y.o. male presents for annual follow-up.  Doing well since last visit No bothersome LUTS; nocturia x 1; remains on dutasteride Denies dysuria, gross hematuria Denies flank, abdominal or pelvic pain PSA 09/20/2021 was 5.0 (uncorrected).  MRI was discussed however a repeat PSA October 2023 was 2.7 (uncorrected)  PMH: Past Medical History:  Diagnosis Date   BPH (benign prostatic hypertrophy)    GERD (gastroesophageal reflux disease)    Hyperlipidemia    Hypertension    TIA (transient ischemic attack) 11/2018    Surgical History: Past Surgical History:  Procedure Laterality Date   COLONOSCOPY  2005, 01/2015   Dr Vira Agar- cleared for 3 yrs   TONSILLECTOMY     UPPER GASTROINTESTINAL ENDOSCOPY  01/2015   Dr Vira Agar- erosions on esophagus    Home Medications:  Allergies as of 09/28/2022   No Known Allergies      Medication List        Accurate as of September 28, 2022 11:21 AM. If you have any questions, ask your nurse or doctor.          aspirin EC 81 MG tablet Commonly known as: ASPIRIN LOW DOSE Take 1 tablet (81 mg total) by mouth daily. Swallow whole.   CENTRUM SILVER ADULT 50+ PO Take 1 tablet by mouth daily.   CoQ-10 100 MG Caps Take 1 capsule by mouth daily.   dutasteride 0.5 MG capsule Commonly known as: AVODART Take 1 capsule (0.5 mg total) by mouth daily.   Fish Oil 1000 MG Caps Take 1 capsule (1,000 mg total) by mouth 2 (two) times daily.   hydrochlorothiazide 12.5 MG capsule Commonly known as:  MICROZIDE TAKE 1 CAPSULE BY MOUTH EVERY DAY   NIFEdipine 30 MG 24 hr tablet Commonly known as: PROCARDIA-XL/NIFEDICAL-XL TAKE 1 TABLET BY MOUTH EVERY DAY   oseltamivir 75 MG capsule Commonly known as: Tamiflu Take 1 capsule (75 mg total) by mouth 2 (two) times daily.   simvastatin 20 MG tablet Commonly known as: ZOCOR TAKE 1 TABLET BY MOUTH EVERY DAY   tadalafil 20 MG tablet Commonly known as: CIALIS 0.5-1 tab 1 hour prior to intercourse   Vitamin C 500 MG Caps Take 1 capsule by mouth 2 (two) times daily.   Vitamin D3 50 MCG (2000 UT) capsule Take 1 capsule by mouth daily.   Zinc 50 MG Tabs Take 50 mg by mouth daily.        Allergies: No Known Allergies  Family History: Family History  Problem Relation Age of Onset   Heart disease Father    Heart disease Brother     Social History:  reports that he quit smoking about 51 years ago. His smoking use included cigarettes. He has never used smokeless tobacco. He reports current alcohol use. He reports that he does not use drugs.   Physical Exam: BP (!) 195/83   Pulse 68   Ht '5\' 8"'$  (1.727 m)   Wt 170 lb (77.1 kg)   BMI 25.85 kg/m   Constitutional:  Alert  and oriented, No acute distress. HEENT: [place] AT Respiratory: Normal respiratory effort, no increased work of breathing. Psychiatric: Normal mood and affect.   Assessment & Plan:    1.  Elevated PSA Stable Follow-up October 2024 with PSA prior  2.  BPH with LUTS Stable Dutasteride refilled    Abbie Sons, MD  [place] [address], [place], [place] [address] [phone]

## 2022-10-12 ENCOUNTER — Ambulatory Visit (INDEPENDENT_AMBULATORY_CARE_PROVIDER_SITE_OTHER): Payer: Medicare Other | Admitting: Family Medicine

## 2022-10-12 ENCOUNTER — Encounter: Payer: Self-pay | Admitting: Family Medicine

## 2022-10-12 VITALS — BP 120/70 | HR 68 | Ht 68.0 in | Wt 178.0 lb

## 2022-10-12 DIAGNOSIS — Z23 Encounter for immunization: Secondary | ICD-10-CM

## 2022-10-12 DIAGNOSIS — I10 Essential (primary) hypertension: Secondary | ICD-10-CM

## 2022-10-12 DIAGNOSIS — E7849 Other hyperlipidemia: Secondary | ICD-10-CM

## 2022-10-12 MED ORDER — NIFEDIPINE ER OSMOTIC RELEASE 30 MG PO TB24: 30.0000 mg | ORAL_TABLET | Freq: Every day | ORAL | 1 refills | Status: DC

## 2022-10-12 MED ORDER — SIMVASTATIN 20 MG PO TABS: 20.0000 mg | ORAL_TABLET | Freq: Every day | ORAL | 1 refills | Status: DC

## 2022-10-12 MED ORDER — HYDROCHLOROTHIAZIDE 12.5 MG PO CAPS: 12.5000 mg | ORAL_CAPSULE | Freq: Every day | ORAL | 1 refills | Status: DC

## 2022-10-12 NOTE — Progress Notes (Signed)
Date:  10/12/2022   Name:  Alfred Edwards   DOB:  06-18-1946   MRN:  [id number]   Chief Complaint: Hypertension, Hyperlipidemia, and shingles vaccine  Hypertension This is a chronic problem. The problem has been gradually improving since onset. The problem is controlled. Pertinent negatives include no anxiety, blurred vision, chest pain, headaches, malaise/fatigue, neck pain, orthopnea, palpitations, PND, shortness of breath or sweats. There are no associated agents to hypertension. Risk factors for coronary artery disease include diabetes mellitus and dyslipidemia. Past treatments include diuretics and calcium channel blockers. The current treatment provides moderate improvement. There are no compliance problems.  Hypertensive end-organ damage includes CVA. There is no history of CAD/MI or left ventricular hypertrophy. tia/2019. There is no history of chronic renal disease, a hypertension causing med or renovascular disease.  Hyperlipidemia This is a chronic problem. The current episode started more than 1 year ago. The problem is controlled. Recent lipid tests were reviewed and are normal. He has no history of chronic renal disease, diabetes or liver disease. Factors aggravating his hyperlipidemia include thiazides. Pertinent negatives include no chest pain, myalgias or shortness of breath. Current antihyperlipidemic treatment includes statins. Risk factors for coronary artery disease include dyslipidemia.    Lab Results  Component Value Date   NA 141 04/11/2022   K 4.9 04/11/2022   CO2 25 04/11/2022   GLUCOSE 92 04/11/2022   BUN 13 04/11/2022   CREATININE 1.15 04/11/2022   CALCIUM 9.4 04/11/2022   EGFR 67 04/11/2022   GFRNONAA 67 08/24/2020   Lab Results  Component Value Date   CHOL 186 04/11/2022   HDL 79 04/11/2022   LDLCALC 90 04/11/2022   TRIG 95 04/11/2022   CHOLHDL 2.1 08/21/2019   No results found for: "TSH" Lab Results  Component Value Date   HGBA1C 5.5 11/28/2018    Lab Results  Component Value Date   WBC 7.3 11/28/2018   HGB 12.8 (L) 11/28/2018   HCT 39.7 11/28/2018   MCV 84.8 11/28/2018   PLT 264 11/28/2018   Lab Results  Component Value Date   ALT 14 04/11/2022   AST 14 04/11/2022   ALKPHOS 53 04/11/2022   BILITOT 0.8 04/11/2022   No results found for: "25OHVITD2", "25OHVITD3", "VD25OH"   Review of Systems  Constitutional:  Negative for chills, fever and malaise/fatigue.  HENT:  Negative for drooling, ear discharge, ear pain and sore throat.   Eyes:  Negative for blurred vision.  Respiratory:  Negative for cough, shortness of breath and wheezing.   Cardiovascular:  Negative for chest pain, palpitations, orthopnea, leg swelling and PND.  Gastrointestinal:  Negative for abdominal pain, blood in stool, constipation, diarrhea and nausea.  Endocrine: Negative for polydipsia.  Genitourinary:  Negative for dysuria, frequency, hematuria and urgency.  Musculoskeletal:  Negative for back pain, myalgias and neck pain.  Skin:  Negative for rash.  Allergic/Immunologic: Negative for environmental allergies.  Neurological:  Negative for dizziness and headaches.  Hematological:  Does not bruise/bleed easily.  Psychiatric/Behavioral:  Negative for suicidal ideas. The patient is not nervous/anxious.     Patient Active Problem List   Diagnosis Date Noted   Rotator cuff impingement syndrome of right shoulder 12/03/2021   Biceps tendinitis, right 12/03/2021   CVA (cerebral vascular accident) ([place]) 11/29/2018   TIA (transient ischemic attack) 11/28/2018   Elevated PSA 08/28/2017   Erectile dysfunction 08/28/2017   Benign prostatic hyperplasia with lower urinary tract symptoms 08/28/2017   Venous insufficiency 02/02/2017   Essential hypertension  12/28/2015   Hyperlipidemia 12/28/2015   Nocturia associated with benign prostatic hypertrophy 12/28/2015   Familial multiple lipoprotein-type hyperlipidemia 01/23/2015   Anxiety disorder due to known  physiological condition 01/23/2015   Routine general medical examination at a health care facility 01/23/2015   Adjustment reaction 01/23/2015   Essential (primary) hypertension 01/23/2015   Proteinuria 06/01/2012    No Known Allergies  Past Surgical History:  Procedure Laterality Date   COLONOSCOPY  2005, 01/2015   Dr Vira Agar- cleared for 3 yrs   TONSILLECTOMY     UPPER GASTROINTESTINAL ENDOSCOPY  01/2015   Dr Vira Agar- erosions on esophagus    Social History   Tobacco Use   Smoking status: Former    Types: Cigarettes    Quit date: 09/13/1971    Years since quitting: 51.1   Smokeless tobacco: Never  Vaping Use   Vaping Use: Never used  Substance Use Topics   Alcohol use: Yes    Alcohol/week: 0.0 standard drinks of alcohol    Comment: socially   Drug use: No     Medication list has been reviewed and updated.  Current Meds  Medication Sig   Ascorbic Acid (VITAMIN C) 500 MG CAPS Take 1 capsule by mouth 2 (two) times daily.    aspirin (ASPIRIN LOW DOSE) 81 MG EC tablet Take 1 tablet (81 mg total) by mouth daily. Swallow whole.   Cholecalciferol (VITAMIN D3) 2000 UNITS capsule Take 1 capsule by mouth daily.   Coenzyme Q10 (COQ-10) 100 MG CAPS Take 1 capsule by mouth daily.   dutasteride (AVODART) 0.5 MG capsule Take 1 capsule (0.5 mg total) by mouth daily.   hydrochlorothiazide (MICROZIDE) 12.5 MG capsule TAKE 1 CAPSULE BY MOUTH EVERY DAY   Multiple Vitamins-Minerals (CENTRUM SILVER ADULT 50+ PO) Take 1 tablet by mouth daily.   NIFEdipine (PROCARDIA-XL/NIFEDICAL-XL) 30 MG 24 hr tablet TAKE 1 TABLET BY MOUTH EVERY DAY   Omega-3 Fatty Acids (FISH OIL) 1000 MG CAPS Take 1 capsule (1,000 mg total) by mouth 2 (two) times daily.   simvastatin (ZOCOR) 20 MG tablet TAKE 1 TABLET BY MOUTH EVERY DAY   tadalafil (CIALIS) 20 MG tablet 0.5-1 tab 1 hour prior to intercourse   Zinc 50 MG TABS Take 50 mg by mouth daily.       10/12/2022   11:15 AM 08/29/2022    1:41 PM 04/11/2022     1:22 PM 12/03/2021   10:20 AM  GAD 7 : Generalized Anxiety Score  Nervous, Anxious, on Edge 0 0 0 0  Control/stop worrying 0 0 0 0  Worry too much - different things 0 0 0 0  Trouble relaxing 0 0 0 0  Restless 0 0 0 0  Easily annoyed or irritable 0 0 0 0  Afraid - awful might happen 0 0 0 0  Total GAD 7 Score 0 0 0 0  Anxiety Difficulty Not difficult at all Not difficult at all Not difficult at all        10/12/2022   11:15 AM 08/29/2022    1:41 PM 04/11/2022    1:22 PM  Depression screen PHQ 2/9  Decreased Interest 0 0 0  Down, Depressed, Hopeless 1 0 0  PHQ - 2 Score 1 0 0  Altered sleeping 0 0 0  Tired, decreased energy 0 0 0  Change in appetite 0 0 0  Feeling bad or failure about yourself  0 0 0  Trouble concentrating 0 0 0  Moving slowly or fidgety/restless 0  0 0  Suicidal thoughts 0 0 0  PHQ-9 Score 1 0 0  Difficult doing work/chores Not difficult at all Not difficult at all Not difficult at all    BP Readings from Last 3 Encounters:  10/12/22 120/70  09/28/22 (!) 195/83  08/29/22 128/62    Physical Exam Vitals and nursing note reviewed.  HENT:     Head: Normocephalic.     Right Ear: Tympanic membrane and external ear normal.     Left Ear: Tympanic membrane and external ear normal.     Nose: Nose normal.     Mouth/Throat:     Mouth: Mucous membranes are moist.  Eyes:     General: No scleral icterus.       Right eye: No discharge.        Left eye: No discharge.     Conjunctiva/sclera: Conjunctivae normal.     Pupils: Pupils are equal, round, and reactive to light.  Neck:     Thyroid: No thyromegaly.     Vascular: No JVD.     Trachea: No tracheal deviation.  Cardiovascular:     Rate and Rhythm: Normal rate and regular rhythm.     Heart sounds: Normal heart sounds, S1 normal and S2 normal. No murmur heard.    No systolic murmur is present.     No diastolic murmur is present.     No friction rub. No gallop. No S3 or S4 sounds.  Pulmonary:     Effort:  No respiratory distress.     Breath sounds: Normal breath sounds. No wheezing, rhonchi or rales.  Abdominal:     General: Bowel sounds are normal.     Palpations: Abdomen is soft. There is no mass.     Tenderness: There is no abdominal tenderness. There is no guarding or rebound.  Musculoskeletal:        General: No tenderness. Normal range of motion.     Cervical back: Neck supple.  Lymphadenopathy:     Cervical: No cervical adenopathy.  Skin:    General: Skin is warm.     Findings: No rash.  Neurological:     Mental Status: He is alert.     Wt Readings from Last 3 Encounters:  10/12/22 178 lb (80.7 kg)  09/28/22 170 lb (77.1 kg)  08/29/22 172 lb (78 kg)    BP 120/70   Pulse 68   Ht '5\' 8"'$  (1.727 m)   Wt 178 lb (80.7 kg)   SpO2 98%   BMI 27.06 kg/m   Assessment and Plan:  1. Essential (primary) hypertension Chronic.  Controlled.  Stable.  Continue hydrochlorothiazide 12.5 mg once a day and nifedipine XL 30 mg once a day.  Blood pressure today 120/70.  Asymptomatic.  Tolerating medication well.  Will check renal function panel for GFR and electrolytes.  Will recheck patient in 6 months. - hydrochlorothiazide (MICROZIDE) 12.5 MG capsule; Take 1 capsule (12.5 mg total) by mouth daily.  Dispense: 90 capsule; Refill: 1 - NIFEdipine (PROCARDIA-XL/NIFEDICAL-XL) 30 MG 24 hr tablet; Take 1 tablet (30 mg total) by mouth daily.  Dispense: 90 tablet; Refill: 1 - Renal Function Panel  2. Familial multiple lipoprotein-type hyperlipidemia Chronic.  Controlled.  Stable.  Continue simvastatin 20 mg once a day.  We will hold on labs at this time. - simvastatin (ZOCOR) 20 MG tablet; Take 1 tablet (20 mg total) by mouth daily.  Dispense: 90 tablet; Refill: 1  3. Need for shingles vaccine Discussed and shingles vaccine  initiated - Zoster Recombinant (Shingrix )    Otilio Miu, MD

## 2022-10-13 LAB — RENAL FUNCTION PANEL
Albumin: 4.2 g/dL (ref 3.8–4.8)
BUN/Creatinine Ratio: 16 (ref 10–24)
BUN: 18 mg/dL (ref 8–27)
CO2: 24 mmol/L (ref 20–29)
Calcium: 9.5 mg/dL (ref 8.6–10.2)
Chloride: 104 mmol/L (ref 96–106)
Creatinine, Ser: 1.16 mg/dL (ref 0.76–1.27)
Glucose: 111 mg/dL — ABNORMAL HIGH (ref 70–99)
Phosphorus: 2.7 mg/dL — ABNORMAL LOW (ref 2.8–4.1)
Potassium: 5 mmol/L (ref 3.5–5.2)
Sodium: 143 mmol/L (ref 134–144)
eGFR: 66 mL/min/{1.73_m2} (ref >59)

## 2022-10-16 ENCOUNTER — Other Ambulatory Visit: Payer: Self-pay | Admitting: Family Medicine

## 2022-10-16 DIAGNOSIS — I10 Essential (primary) hypertension: Secondary | ICD-10-CM

## 2022-10-17 NOTE — Telephone Encounter (Signed)
Unable to refill per protocol, Rx request is too soon. Last refill 10/12/22 for 90 days. Will refuse duplicate request.  Requested Prescriptions  Pending Prescriptions Disp Refills   NIFEdipine (PROCARDIA-XL/NIFEDICAL-XL) 30 MG 24 hr tablet [Pharmacy Med Name: NIFEDIPINE ER 30 MG TABLET] 90 tablet 1    Sig: TAKE 1 TABLET BY MOUTH EVERY DAY     Cardiovascular: Calcium Channel Blockers 2 Passed - 10/16/2022  1:06 AM      Passed - Last BP in normal range    BP Readings from Last 1 Encounters:  10/12/22 120/70         Passed - Last Heart Rate in normal range    Pulse Readings from Last 1 Encounters:  10/12/22 68         Passed - Valid encounter within last 6 months    Recent Outpatient Visits           5 days ago Essential (primary) hypertension   [facility] [place], Deanna C, MD   1 month ago Acute cough   [facility] [facility] [place], Deanna C, MD   6 months ago Essential (primary) hypertension   [facility] [place], [place], MD   10 months ago Rotator cuff impingement syndrome of right shoulder   [facility] [place], Earley Abide, MD   10 months ago Laceration of skin of right elbow, subsequent encounter   [facility] [place], [place], MD       Future Appointments             In 5 months Juline Patch, MD [place] [facility], [facility]

## 2022-10-19 ENCOUNTER — Ambulatory Visit: Payer: Medicare Other

## 2022-10-19 DIAGNOSIS — M4306 Spondylolysis, lumbar region: Secondary | ICD-10-CM | POA: Diagnosis not present

## 2022-10-19 DIAGNOSIS — M9905 Segmental and somatic dysfunction of pelvic region: Secondary | ICD-10-CM | POA: Diagnosis not present

## 2022-10-19 DIAGNOSIS — M9903 Segmental and somatic dysfunction of lumbar region: Secondary | ICD-10-CM | POA: Diagnosis not present

## 2022-10-19 DIAGNOSIS — M5417 Radiculopathy, lumbosacral region: Secondary | ICD-10-CM | POA: Diagnosis not present

## 2022-11-16 DIAGNOSIS — M9903 Segmental and somatic dysfunction of lumbar region: Secondary | ICD-10-CM | POA: Diagnosis not present

## 2022-11-16 DIAGNOSIS — M5417 Radiculopathy, lumbosacral region: Secondary | ICD-10-CM | POA: Diagnosis not present

## 2022-11-16 DIAGNOSIS — M4306 Spondylolysis, lumbar region: Secondary | ICD-10-CM | POA: Diagnosis not present

## 2022-11-16 DIAGNOSIS — M9905 Segmental and somatic dysfunction of pelvic region: Secondary | ICD-10-CM | POA: Diagnosis not present

## 2022-12-01 ENCOUNTER — Telehealth: Payer: Self-pay | Admitting: Family Medicine

## 2022-12-01 NOTE — Telephone Encounter (Signed)
[place] to schedule their annual wellness visit. Call back at later date: 01/2023  [place] D[place]; [place] Direct Dial: [phone]

## 2022-12-14 DIAGNOSIS — M9903 Segmental and somatic dysfunction of lumbar region: Secondary | ICD-10-CM | POA: Diagnosis not present

## 2022-12-14 DIAGNOSIS — M5417 Radiculopathy, lumbosacral region: Secondary | ICD-10-CM | POA: Diagnosis not present

## 2022-12-14 DIAGNOSIS — M4306 Spondylolysis, lumbar region: Secondary | ICD-10-CM | POA: Diagnosis not present

## 2022-12-14 DIAGNOSIS — M9905 Segmental and somatic dysfunction of pelvic region: Secondary | ICD-10-CM | POA: Diagnosis not present

## 2022-12-22 ENCOUNTER — Other Ambulatory Visit: Payer: Self-pay | Admitting: Family Medicine

## 2022-12-22 DIAGNOSIS — I10 Essential (primary) hypertension: Secondary | ICD-10-CM

## 2022-12-22 NOTE — Telephone Encounter (Signed)
Requested Prescriptions  Pending Prescriptions Disp Refills   hydrochlorothiazide (MICROZIDE) 12.5 MG capsule [Pharmacy Med Name: HYDROCHLOROTHIAZIDE 12.5 MG CP] 90 capsule 0    Sig: TAKE 1 CAPSULE BY MOUTH EVERY DAY     Cardiovascular: Diuretics - Thiazide Passed - 12/22/2022  1:45 AM      Passed - Cr in normal range and within 180 days    Creatinine, Ser  Date Value Ref Range Status  10/12/2022 1.16 0.76 - 1.27 mg/dL Final         Passed - K in normal range and within 180 days    Potassium  Date Value Ref Range Status  10/12/2022 5.0 3.5 - 5.2 mmol/L Final         Passed - Na in normal range and within 180 days    Sodium  Date Value Ref Range Status  10/12/2022 143 134 - 144 mmol/L Final         Passed - Last BP in normal range    BP Readings from Last 1 Encounters:  10/12/22 120/70         Passed - Valid encounter within last 6 months    Recent Outpatient Visits           2 months ago Essential (primary) hypertension   [facility], MD   3 months ago Acute cough   [facility], MD   8 months ago Essential (primary) hypertension   [facility], MD   1 year ago Rotator cuff impingement syndrome of right shoulder   [place] [facility], Ocie Bob, MD   1 year ago Laceration of skin of right elbow, subsequent encounter   [facility], MD       Future Appointments             In 3 months Duanne Limerick, MD [facility] at [facility], [facility]

## 2023-01-10 ENCOUNTER — Ambulatory Visit: Payer: Medicare Other

## 2023-01-11 DIAGNOSIS — M9905 Segmental and somatic dysfunction of pelvic region: Secondary | ICD-10-CM | POA: Diagnosis not present

## 2023-01-11 DIAGNOSIS — M5417 Radiculopathy, lumbosacral region: Secondary | ICD-10-CM | POA: Diagnosis not present

## 2023-01-11 DIAGNOSIS — M4306 Spondylolysis, lumbar region: Secondary | ICD-10-CM | POA: Diagnosis not present

## 2023-01-11 DIAGNOSIS — M9903 Segmental and somatic dysfunction of lumbar region: Secondary | ICD-10-CM | POA: Diagnosis not present

## 2023-01-20 ENCOUNTER — Ambulatory Visit (INDEPENDENT_AMBULATORY_CARE_PROVIDER_SITE_OTHER): Payer: Medicare Other

## 2023-01-20 DIAGNOSIS — Z23 Encounter for immunization: Secondary | ICD-10-CM | POA: Diagnosis not present

## 2023-01-26 ENCOUNTER — Telehealth: Payer: Self-pay | Admitting: Family Medicine

## 2023-01-26 NOTE — Telephone Encounter (Signed)
Contacted Zella Ball to schedule their annual wellness visit. Appointment made for 02/15/2023.  Verlee Rossetti; Care Guide Ambulatory Clinical Support [place] [facility] Direct Dial: [phone]

## 2023-02-08 DIAGNOSIS — M5417 Radiculopathy, lumbosacral region: Secondary | ICD-10-CM | POA: Diagnosis not present

## 2023-02-08 DIAGNOSIS — M4306 Spondylolysis, lumbar region: Secondary | ICD-10-CM | POA: Diagnosis not present

## 2023-02-08 DIAGNOSIS — M9903 Segmental and somatic dysfunction of lumbar region: Secondary | ICD-10-CM | POA: Diagnosis not present

## 2023-02-08 DIAGNOSIS — M9905 Segmental and somatic dysfunction of pelvic region: Secondary | ICD-10-CM | POA: Diagnosis not present

## 2023-02-15 ENCOUNTER — Other Ambulatory Visit: Payer: Self-pay

## 2023-02-15 ENCOUNTER — Ambulatory Visit (INDEPENDENT_AMBULATORY_CARE_PROVIDER_SITE_OTHER): Payer: Medicare Other

## 2023-02-15 VITALS — BP 138/78 | Ht 68.0 in | Wt 174.4 lb

## 2023-02-15 DIAGNOSIS — E7849 Other hyperlipidemia: Secondary | ICD-10-CM

## 2023-02-15 DIAGNOSIS — Z0000 Encounter for general adult medical examination without abnormal findings: Secondary | ICD-10-CM | POA: Diagnosis not present

## 2023-02-15 DIAGNOSIS — I10 Essential (primary) hypertension: Secondary | ICD-10-CM

## 2023-02-15 MED ORDER — SIMVASTATIN 20 MG PO TABS: 20.0000 mg | ORAL_TABLET | Freq: Every day | ORAL | 1 refills | Status: DC

## 2023-02-15 MED ORDER — NIFEDIPINE ER OSMOTIC RELEASE 30 MG PO TB24: 30.0000 mg | ORAL_TABLET | Freq: Every day | ORAL | 1 refills | Status: DC

## 2023-02-15 NOTE — Patient Instructions (Signed)
Alfred Edwards , Thank you for taking time to come for your Medicare Wellness Visit. I appreciate your ongoing commitment to your health goals. Please review the following plan we discussed and let me know if I can assist you in the future.   These are the goals we discussed:  Goals      DIET - EAT MORE FRUITS AND VEGETABLES     DIET - INCREASE WATER INTAKE     Recommend drinking 6-8 glasses of water per day      Reduce sugar intake to X grams per day     Continue to reduce sweets and carbs from diet     Weight (lb) < 160 lb (72.6 kg)     Pt would like to lose weight to get to goal of 160        This is a list of the screening recommended for you and due dates:  Health Maintenance  Topic Date Due   DTaP/Tdap/Td vaccine (2 - Td or Tdap) 12/07/2022   Colon Cancer Screening  02/14/2023   Flu Shot  04/13/2023   Medicare Annual Wellness Visit  02/15/2024   Pneumonia Vaccine  Completed   Hepatitis C Screening  Completed   Zoster (Shingles) Vaccine  Completed   HPV Vaccine  Aged Out   COVID-19 Vaccine  Discontinued    Advanced directives: no  Conditions/risks identified: none  Next appointment: Follow up in one year for your annual wellness visit. 02/21/24 @ 10:30 am in person  Preventive Care 65 Years and Older, Male  Preventive care refers to lifestyle choices and visits with your health care provider that can promote health and wellness. What does preventive care include? A yearly physical exam. This is also called an annual well check. Dental exams once or twice a year. Routine eye exams. Ask your health care provider how often you should have your eyes checked. Personal lifestyle choices, including: Daily care of your teeth and gums. Regular physical activity. Eating a healthy diet. Avoiding tobacco and drug use. Limiting alcohol use. Practicing safe sex. Taking low doses of aspirin every day. Taking vitamin and mineral supplements as recommended by your health care  provider. What happens during an annual well check? The services and screenings done by your health care provider during your annual well check will depend on your age, overall health, lifestyle risk factors, and family history of disease. Counseling  Your health care provider may ask you questions about your: Alcohol use. Tobacco use. Drug use. Emotional well-being. Home and relationship well-being. Sexual activity. Eating habits. History of falls. Memory and ability to understand (cognition). Work and work Astronomer. Screening  You may have the following tests or measurements: Height, weight, and BMI. Blood pressure. Lipid and cholesterol levels. These may be checked every 5 years, or more frequently if you are over 16 years old. Skin check. Lung cancer screening. You may have this screening every year starting at age 36 if you have a 30-pack-year history of smoking and currently smoke or have quit within the past 15 years. Fecal occult blood test (FOBT) of the stool. You may have this test every year starting at age 22. Flexible sigmoidoscopy or colonoscopy. You may have a sigmoidoscopy every 5 years or a colonoscopy every 10 years starting at age 60. Prostate cancer screening. Recommendations will vary depending on your family history and other risks. Hepatitis C blood test. Hepatitis B blood test. Sexually transmitted disease (STD) testing. Diabetes screening. This is done by checking  your blood sugar (glucose) after you have not eaten for a while (fasting). You may have this done every 1-3 years. Abdominal aortic aneurysm (AAA) screening. You may need this if you are a current or former smoker. Osteoporosis. You may be screened starting at age 82 if you are at high risk. Talk with your health care provider about your test results, treatment options, and if necessary, the need for more tests. Vaccines  Your health care provider may recommend certain vaccines, such  as: Influenza vaccine. This is recommended every year. Tetanus, diphtheria, and acellular pertussis (Tdap, Td) vaccine. You may need a Td booster every 10 years. Zoster vaccine. You may need this after age 51. Pneumococcal 13-valent conjugate (PCV13) vaccine. One dose is recommended after age 54. Pneumococcal polysaccharide (PPSV23) vaccine. One dose is recommended after age 73. Talk to your health care provider about which screenings and vaccines you need and how often you need them. This information is not intended to replace advice given to you by your health care provider. Make sure you discuss any questions you have with your health care provider. Document Released: 09/25/2015 Document Revised: 05/18/2016 Document Reviewed: 06/30/2015 Elsevier Interactive Patient Education  2017 [employer].  Fall Prevention in the Home Falls can cause injuries. They can happen to people of all ages. There are many things you can do to make your home safe and to help prevent falls. What can I do on the outside of my home? Regularly fix the edges of walkways and driveways and fix any cracks. Remove anything that might make you trip as you walk through a door, such as a raised step or threshold. Trim any bushes or trees on the path to your home. Use bright outdoor lighting. Clear any walking paths of anything that might make someone trip, such as rocks or tools. Regularly check to see if handrails are loose or broken. Make sure that both sides of any steps have handrails. Any raised decks and porches should have guardrails on the edges. Have any leaves, snow, or ice cleared regularly. Use sand or salt on walking paths during winter. Clean up any spills in your garage right away. This includes oil or grease spills. What can I do in the bathroom? Use night lights. Install grab bars by the toilet and in the tub and shower. Do not use towel bars as grab bars. Use non-skid mats or decals in the tub or  shower. If you need to sit down in the shower, use a plastic, non-slip stool. Keep the floor dry. Clean up any water that spills on the floor as soon as it happens. Remove soap buildup in the tub or shower regularly. Attach bath mats securely with double-sided non-slip rug tape. Do not have throw rugs and other things on the floor that can make you trip. What can I do in the bedroom? Use night lights. Make sure that you have a light by your bed that is easy to reach. Do not use any sheets or blankets that are too big for your bed. They should not hang down onto the floor. Have a firm chair that has side arms. You can use this for support while you get dressed. Do not have throw rugs and other things on the floor that can make you trip. What can I do in the kitchen? Clean up any spills right away. Avoid walking on wet floors. Keep items that you use a lot in easy-to-reach places. If you need to reach something  above you, use a strong step stool that has a grab bar. Keep electrical cords out of the way. Do not use floor polish or wax that makes floors slippery. If you must use wax, use non-skid floor wax. Do not have throw rugs and other things on the floor that can make you trip. What can I do with my stairs? Do not leave any items on the stairs. Make sure that there are handrails on both sides of the stairs and use them. Fix handrails that are broken or loose. Make sure that handrails are as long as the stairways. Check any carpeting to make sure that it is firmly attached to the stairs. Fix any carpet that is loose or worn. Avoid having throw rugs at the top or bottom of the stairs. If you do have throw rugs, attach them to the floor with carpet tape. Make sure that you have a light switch at the top of the stairs and the bottom of the stairs. If you do not have them, ask someone to add them for you. What else can I do to help prevent falls? Wear shoes that: Do not have high heels. Have  rubber bottoms. Are comfortable and fit you well. Are closed at the toe. Do not wear sandals. If you use a stepladder: Make sure that it is fully opened. Do not climb a closed stepladder. Make sure that both sides of the stepladder are locked into place. Ask someone to hold it for you, if possible. Clearly mark and make sure that you can see: Any grab bars or handrails. First and last steps. Where the edge of each step is. Use tools that help you move around (mobility aids) if they are needed. These include: Canes. Walkers. Scooters. Crutches. Turn on the lights when you go into a dark area. Replace any light bulbs as soon as they burn out. Set up your furniture so you have a clear path. Avoid moving your furniture around. If any of your floors are uneven, fix them. If there are any pets around you, be aware of where they are. Review your medicines with your doctor. Some medicines can make you feel dizzy. This can increase your chance of falling. Ask your doctor what other things that you can do to help prevent falls. This information is not intended to replace advice given to you by your health care provider. Make sure you discuss any questions you have with your health care provider. Document Released: 06/25/2009 Document Revised: 02/04/2016 Document Reviewed: 10/03/2014 Elsevier Interactive Patient Education  2017 [employer].

## 2023-02-15 NOTE — Progress Notes (Signed)
Subjective:   Alfred Edwards is a 77 y.o. male who presents for Medicare Annual/Subsequent preventive examination.  Review of Systems     Cardiac Risk Factors include: advanced age (>57men, >30 women);dyslipidemia;hypertension;male gender     Objective:    Today's Vitals   02/15/23 1048 02/15/23 1100  BP: (!) 160/78 138/78  Weight: 174 lb 6.4 oz (79.1 kg)   Height: 5\' 8"  (1.727 m)    Body mass index is 26.52 kg/m.     02/15/2023   10:55 AM 10/18/2021    8:51 AM 10/14/2020    8:41 AM 11/28/2018    7:44 PM 10/10/2018    9:45 AM 07/24/2017    9:38 AM 06/29/2015    9:10 AM  Advanced Directives  Does Patient Have a Medical Advance Directive? Yes Yes Yes No Yes Yes Yes  Type of Estate agent of [place];Living will Healthcare Power of [place];Living will Healthcare Power of [place];Living will  Living will;Healthcare Power of [employer] Power of [place];Living will Healthcare Power of [place];Living will  Does patient want to make changes to medical advance directive? No - Patient declined        Copy of Healthcare Power of Attorney in Chart? Yes - validated most recent copy scanned in chart (See row information) Yes - validated most recent copy scanned in chart (See row information) Yes - validated most recent copy scanned in chart (See row information)  Yes - validated most recent copy scanned in chart (See row information) No - copy requested No - copy requested  Would patient like information on creating a medical advance directive?    No - Patient declined       Current Medications (verified) Outpatient Encounter Medications as of 02/15/2023  Medication Sig   Ascorbic Acid (VITAMIN C) 500 MG CAPS Take 1 capsule by mouth 2 (two) times daily.    aspirin (ASPIRIN LOW DOSE) 81 MG EC tablet Take 1 tablet (81 mg total) by mouth daily. Swallow whole.   Cholecalciferol (VITAMIN D3) 2000 UNITS capsule Take 1 capsule by mouth daily.   Coenzyme Q10  (COQ-10) 100 MG CAPS Take 1 capsule by mouth daily.   dutasteride (AVODART) 0.5 MG capsule Take 1 capsule (0.5 mg total) by mouth daily.   hydrochlorothiazide (MICROZIDE) 12.5 MG capsule TAKE 1 CAPSULE BY MOUTH EVERY DAY   Multiple Vitamins-Minerals (CENTRUM SILVER ADULT 50+ PO) Take 1 tablet by mouth daily.   NIFEdipine (PROCARDIA-XL/NIFEDICAL-XL) 30 MG 24 hr tablet Take 1 tablet (30 mg total) by mouth daily.   Omega-3 Fatty Acids (FISH OIL) 1000 MG CAPS Take 1 capsule (1,000 mg total) by mouth 2 (two) times daily.   simvastatin (ZOCOR) 20 MG tablet Take 1 tablet (20 mg total) by mouth daily.   Zinc 50 MG TABS Take 50 mg by mouth daily.   tadalafil (CIALIS) 20 MG tablet 0.5-1 tab 1 hour prior to intercourse (Patient not taking: Reported on 02/15/2023)   No facility-administered encounter medications on file as of 02/15/2023.    Allergies (verified) Patient has no known allergies.   History: Past Medical History:  Diagnosis Date   BPH (benign prostatic hypertrophy)    GERD (gastroesophageal reflux disease)    Hyperlipidemia    Hypertension    TIA (transient ischemic attack) 11/2018   Past Surgical History:  Procedure Laterality Date   COLONOSCOPY  2005, 01/2015   Dr Mechele Collin- cleared for 3 yrs   TONSILLECTOMY     UPPER GASTROINTESTINAL ENDOSCOPY  01/2015   Dr Mechele Collin- erosions on esophagus   Family History  Problem Relation Age of Onset   Heart disease Father    Heart disease Brother    Social History   Socioeconomic History   Marital status: Widowed    Spouse name: Not on file   Number of children: 0   Years of education: Not on file   Highest education level: Bachelor's degree (e.g., BA, AB, BS)  Occupational History   Occupation: Retired  Tobacco Use   Smoking status: Former    Types: Cigarettes    Quit date: 09/13/1971    Years since quitting: 51.4   Smokeless tobacco: Never  Vaping Use   Vaping Use: Never used  Substance and Sexual Activity   Alcohol use: Yes     Alcohol/week: 0.0 standard drinks of alcohol    Comment: socially   Drug use: No   Sexual activity: Yes  Other Topics Concern   Not on file  Social History Narrative   Wife passed in 2012 from cancer   Pt's significant other lives with him   Social Determinants of Health   Financial Resource Strain: Low Risk  (02/15/2023)   Overall Financial Resource Strain (CARDIA)    Difficulty of Paying Living Expenses: Not hard at all  Food Insecurity: No Food Insecurity (02/15/2023)   Hunger Vital Sign    Worried About Running Out of Food in the Last Year: Never true    Ran Out of Food in the Last Year: Never true  Transportation Needs: No Transportation Needs (02/15/2023)   PRAPARE - Administrator, Civil Service (Medical): No    Lack of Transportation (Non-Medical): No  Physical Activity: Sufficiently Active (02/15/2023)   Exercise Vital Sign    Days of Exercise per Week: 3 days    Minutes of Exercise per Session: 60 min  Stress: No Stress Concern Present (02/15/2023)   [employer] of Occupational Health - Occupational Stress Questionnaire    Feeling of Stress : Not at all  Social Connections: Moderately Integrated (02/15/2023)   Social Connection and Isolation Panel [NHANES]    Frequency of Communication with Friends and Family: More than three times a week    Frequency of Social Gatherings with Friends and Family: More than three times a week    Attends Religious Services: More than 4 times per year    Active Member of [employer] or Organizations: No    Attends Engineer, structural: Never    Marital Status: Living with partner    Tobacco Counseling Counseling given: Not Answered   Clinical Intake:  Pre-visit preparation completed: Yes  Pain : No/denies pain     Nutritional Risks: None Diabetes: No  How often do you need to have someone help you when you read instructions, pamphlets, or other written materials from your doctor or pharmacy?: 1 -  Never  Diabetic?no  Interpreter Needed?: No  Information entered by :: Kennedy Bucker, LPN   Activities of Daily Living    02/15/2023   10:57 AM  In your present state of health, do you have any difficulty performing the following activities:  Hearing? 0  Vision? 0  Difficulty concentrating or making decisions? 0  Walking or climbing stairs? 0  Dressing or bathing? 0  Doing errands, shopping? 0  Preparing Food and eating ? N  Using the Toilet? N  In the past six months, have you accidently leaked urine? N  Do you have problems with loss of  bowel control? N  Managing your Medications? N  Managing your Finances? N  Housekeeping or managing your Housekeeping? N    Patient Care Team: Duanne Limerick, MD as PCP - General (Family Medicine) Riki Altes, MD as Consulting Physician (Urology) Jesusita Oka, MD as Consulting Physician (Dermatology)  Indicate any recent Medical Services you may have received from other than [facility] providers in the past year (date may be approximate).     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  Hearing/Vision screen Hearing Screening - Comments:: No aids Vision Screening - Comments:: Wears glasses- [facility] [address]  Dietary issues and exercise activities discussed: Current Exercise Habits: Home exercise routine, Type of exercise: Other - see comments (golf x 3 days a week), Time (Minutes): 60, Frequency (Times/Week): 3, Weekly Exercise (Minutes/Week): 180, Intensity: Mild   Goals Addressed             This Visit's Progress    DIET - EAT MORE FRUITS AND VEGETABLES         Depression Screen    02/15/2023   10:53 AM 10/12/2022   11:15 AM 08/29/2022    1:41 PM 04/11/2022    1:22 PM 12/03/2021   10:20 AM 10/18/2021    8:49 AM 05/25/2021    9:29 AM  PHQ 2/9 Scores  PHQ - 2 Score 0 1 0 0 0 0 0  PHQ- 9 Score 0 1 0 0 0 3 0    Fall Risk    02/15/2023   10:56 AM 10/12/2022   11:14 AM 08/29/2022    1:41 PM  04/11/2022    1:22 PM 12/03/2021   10:21 AM  Fall Risk   Falls in the past year? 0 0 0 0 0  Number falls in past yr: 0 0 0 0 0  Injury with Fall? 0 0  0 0  Risk for fall due to : History of fall(s) No Fall Risks No Fall Risks No Fall Risks   Follow up Falls prevention discussed;Falls evaluation completed Falls evaluation completed Falls evaluation completed Falls evaluation completed     FALL RISK PREVENTION PERTAINING TO THE HOME:  Any stairs in or around the home? No  If so, are there any without handrails? No  Home free of loose throw rugs in walkways, pet beds, electrical cords, etc? Yes  Adequate lighting in your home to reduce risk of falls? Yes   ASSISTIVE DEVICES UTILIZED TO PREVENT FALLS:  Life alert? No  Use of a cane, walker or w/c? No  Grab bars in the bathroom? Yes  Shower chair or bench in shower? Yes  Elevated toilet seat or a handicapped toilet? Yes   TIMED UP AND GO:  Was the test performed? Yes .  Length of time to ambulate 10 feet: 4 sec.   Gait steady and fast without use of assistive device  Cognitive Function:        02/15/2023   11:04 AM 10/14/2019    9:00 AM 10/10/2018    9:54 AM 07/24/2017    9:37 AM  6CIT Screen  What Year? 0 points 0 points 0 points 0 points  What month? 0 points 0 points 0 points 0 points  What time? 0 points 0 points 0 points 0 points  Count back from 20 0 points 0 points 0 points 0 points  Months in reverse 0 points 0 points 0 points 0 points  Repeat phrase 0 points 0 points 0 points  0 points  Total Score 0 points 0 points 0 points 0 points    Immunizations Immunization History  Administered Date(s) Administered   Fluad Quad(high Dose 65+) 08/24/2020, 05/25/2021, 06/22/2022   Influenza, High Dose Seasonal PF 06/22/2017, 07/24/2018, 05/15/2019   Influenza,inj,Quad PF,6+ Mos 07/01/2013, 06/27/2014, 06/29/2015, 08/02/2016   Influenza-Unspecified 06/27/2014   PFIZER(Purple Top)SARS-COV-2 Vaccination 11/14/2019, 12/05/2019    Pneumococcal Conjugate-13 07/24/2017   Pneumococcal Polysaccharide-23 06/23/2008, 08/22/2018   Tdap 12/06/2012   Zoster Recombinat (Shingrix) 10/12/2022, 01/20/2023    TDAP status: Due, Education has been provided regarding the importance of this vaccine. Advised may receive this vaccine at local pharmacy or Health Dept. Aware to provide a copy of the vaccination record if obtained from local pharmacy or Health Dept. Verbalized acceptance and understanding.  Flu Vaccine status: Up to date  Pneumococcal vaccine status: Up to date  Covid-19 vaccine status: Completed vaccines  Qualifies for Shingles Vaccine? Yes   Zostavax completed No   Shingrix Completed?: Yes  Screening Tests Health Maintenance  Topic Date Due   DTaP/Tdap/Td (2 - Td or Tdap) 12/07/2022   Colonoscopy  02/14/2023   INFLUENZA VACCINE  04/13/2023   Medicare Annual Wellness (AWV)  02/15/2024   Pneumonia Vaccine 4+ Years old  Completed   Hepatitis C Screening  Completed   Zoster Vaccines- Shingrix  Completed   HPV VACCINES  Aged Out   COVID-19 Vaccine  Discontinued    Health Maintenance  Health Maintenance Due  Topic Date Due   DTaP/Tdap/Td (2 - Td or Tdap) 12/07/2022   Colonoscopy  02/14/2023    Colorectal cancer screening: Type of screening: Colonoscopy. Completed 02/13/18. Repeat every 5 years- will contact us if wants another colonoscopy  Lung Cancer Screening: (Low Dose CT Chest recommended if Age 11-80 years, 30 pack-year currently smoking OR have quit w/in 15years.) does not qualify.   Additional Screening:  Hepatitis C Screening: does qualify; Completed 07/24/17  Vision Screening: Recommended annual ophthalmology exams for early detection of glaucoma and other disorders of the eye. Is the patient up to date with their annual eye exam?  Yes  Who is the provider or what is the name of the office in which the patient attends annual eye exams? [facility] on [address]. If pt is not established with a  provider, would they like to be referred to a provider to establish care? No .   Dental Screening: Recommended annual dental exams for proper oral hygiene  Community Resource Referral / Chronic Care Management: CRR required this visit?  No   CCM required this visit?  No      Plan:     I have personally reviewed and noted the following in the patient's chart:   Medical and social history Use of alcohol, tobacco or illicit drugs  Current medications and supplements including opioid prescriptions. Patient is not currently taking opioid prescriptions. Functional ability and status Nutritional status Physical activity Advanced directives List of other physicians Hospitalizations, surgeries, and ER visits in previous 12 months Vitals Screenings to include cognitive, depression, and falls Referrals and appointments  In addition, I have reviewed and discussed with patient certain preventive protocols, quality metrics, and best practice recommendations. A written personalized care plan for preventive services as well as general preventive health recommendations were provided to patient.     Hal Hope, LPN   09/17/1094   Nurse Notes: none

## 2023-03-08 DIAGNOSIS — M9905 Segmental and somatic dysfunction of pelvic region: Secondary | ICD-10-CM | POA: Diagnosis not present

## 2023-03-08 DIAGNOSIS — M4306 Spondylolysis, lumbar region: Secondary | ICD-10-CM | POA: Diagnosis not present

## 2023-03-08 DIAGNOSIS — M9903 Segmental and somatic dysfunction of lumbar region: Secondary | ICD-10-CM | POA: Diagnosis not present

## 2023-03-08 DIAGNOSIS — M5417 Radiculopathy, lumbosacral region: Secondary | ICD-10-CM | POA: Diagnosis not present

## 2023-03-13 ENCOUNTER — Other Ambulatory Visit: Payer: Self-pay | Admitting: *Deleted

## 2023-03-13 DIAGNOSIS — R972 Elevated prostate specific antigen [PSA]: Secondary | ICD-10-CM

## 2023-03-18 ENCOUNTER — Other Ambulatory Visit: Payer: Self-pay | Admitting: Family Medicine

## 2023-03-18 DIAGNOSIS — I10 Essential (primary) hypertension: Secondary | ICD-10-CM

## 2023-03-29 ENCOUNTER — Other Ambulatory Visit: Payer: Medicare Other

## 2023-03-29 DIAGNOSIS — R972 Elevated prostate specific antigen [PSA]: Secondary | ICD-10-CM

## 2023-03-30 LAB — PSA: Prostate Specific Ag, Serum: 2.9 ng/mL (ref 0.0–4.0)

## 2023-04-14 ENCOUNTER — Ambulatory Visit: Payer: Medicare Other | Admitting: Family Medicine

## 2023-04-17 ENCOUNTER — Ambulatory Visit (INDEPENDENT_AMBULATORY_CARE_PROVIDER_SITE_OTHER): Payer: Medicare Other | Admitting: Family Medicine

## 2023-04-17 ENCOUNTER — Encounter: Payer: Self-pay | Admitting: Family Medicine

## 2023-04-17 VITALS — BP 146/78 | HR 68 | Ht 68.0 in | Wt 177.0 lb

## 2023-04-17 DIAGNOSIS — I10 Essential (primary) hypertension: Secondary | ICD-10-CM | POA: Diagnosis not present

## 2023-04-17 DIAGNOSIS — E7849 Other hyperlipidemia: Secondary | ICD-10-CM

## 2023-04-17 MED ORDER — SIMVASTATIN 20 MG PO TABS
20.0000 mg | ORAL_TABLET | Freq: Every day | ORAL | 1 refills | Status: AC
Start: 2023-04-17 — End: ?

## 2023-04-17 MED ORDER — NIFEDIPINE ER OSMOTIC RELEASE 30 MG PO TB24
30.0000 mg | ORAL_TABLET | Freq: Every day | ORAL | 1 refills | Status: AC
Start: 2023-04-17 — End: ?

## 2023-04-17 MED ORDER — HYDROCHLOROTHIAZIDE 25 MG PO TABS
25.0000 mg | ORAL_TABLET | Freq: Every day | ORAL | 0 refills | Status: AC
Start: 2023-04-17 — End: ?

## 2023-04-17 NOTE — Progress Notes (Signed)
Date:  04/17/2023   Name:  Alfred Edwards   DOB:  [birth year]   MRN:  [id number]   Chief Complaint: Hypertension  Hypertension This is a chronic problem. The current episode started more than 1 year ago. The problem has been gradually improving since onset. The problem is controlled. Pertinent negatives include no anxiety, blurred vision, chest pain, headaches, palpitations, PND, shortness of breath or sweats. Risk factors for coronary artery disease include dyslipidemia. Past treatments include calcium channel blockers and diuretics. The current treatment provides moderate improvement. There are no compliance problems.  Hypertensive end-organ damage includes CVA. There is no history of CAD/MI. tia. There is no history of chronic renal disease, a hypertension causing med or renovascular disease.  Hyperlipidemia The current episode started more than 1 year ago. The problem is controlled. Recent lipid tests were reviewed and are normal. He has no history of chronic renal disease. Factors aggravating his hyperlipidemia include thiazides. Pertinent negatives include no chest pain, leg pain or shortness of breath. Current antihyperlipidemic treatment includes statins.    Lab Results  Component Value Date   NA 143 10/12/2022   K 5.0 10/12/2022   CO2 24 10/12/2022   GLUCOSE 111 (H) 10/12/2022   BUN 18 10/12/2022   CREATININE 1.16 10/12/2022   CALCIUM 9.5 10/12/2022   EGFR 66 10/12/2022   GFRNONAA 67 08/24/2020   Lab Results  Component Value Date   CHOL 186 04/11/2022   HDL 79 04/11/2022   LDLCALC 90 04/11/2022   TRIG 95 04/11/2022   CHOLHDL 2.1 08/21/2019   No results found for: "TSH" Lab Results  Component Value Date   HGBA1C 5.5 11/28/2018   Lab Results  Component Value Date   WBC 7.3 11/28/2018   HGB 12.8 (L) 11/28/2018   HCT 39.7 11/28/2018   MCV 84.8 11/28/2018   PLT 264 11/28/2018   Lab Results  Component Value Date   ALT 14 04/11/2022   AST 14 04/11/2022    ALKPHOS 53 04/11/2022   BILITOT 0.8 04/11/2022   No results found for: "25OHVITD2", "25OHVITD3", "VD25OH"   Review of Systems  HENT:  Negative for congestion and rhinorrhea.   Eyes:  Negative for blurred vision.  Respiratory:  Negative for chest tightness, shortness of breath and wheezing.   Cardiovascular:  Negative for chest pain, palpitations and PND.  Gastrointestinal:  Negative for abdominal distention.  Neurological:  Negative for headaches.    Patient Active Problem List   Diagnosis Date Noted   Rotator cuff impingement syndrome of right shoulder 12/03/2021   Biceps tendinitis, right 12/03/2021   CVA (cerebral vascular accident) (HCC) 11/29/2018   TIA (transient ischemic attack) 11/28/2018   Elevated PSA 08/28/2017   Erectile dysfunction 08/28/2017   Benign prostatic hyperplasia with lower urinary tract symptoms 08/28/2017   Venous insufficiency 02/02/2017   Essential hypertension 12/28/2015   Hyperlipidemia 12/28/2015   Nocturia associated with benign prostatic hypertrophy 12/28/2015   Familial multiple lipoprotein-type hyperlipidemia 01/23/2015   Anxiety disorder due to known physiological condition 01/23/2015   Routine general medical examination at a health care facility 01/23/2015   Adjustment reaction 01/23/2015   Essential (primary) hypertension 01/23/2015   Proteinuria 06/01/2012    No Known Allergies  Past Surgical History:  Procedure Laterality Date   COLONOSCOPY  2005, 01/2015   Dr Mechele Collin- cleared for 3 yrs   TONSILLECTOMY     UPPER GASTROINTESTINAL ENDOSCOPY  01/2015   Dr Mechele Collin- erosions on esophagus    Social History  Tobacco Use   Smoking status: Former    Current packs/day: 0.00    Types: Cigarettes    Quit date: 09/13/1971    Years since quitting: 51.6   Smokeless tobacco: Never  Vaping Use   Vaping status: Never Used  Substance Use Topics   Alcohol use: Yes    Alcohol/week: 0.0 standard drinks of alcohol    Comment: socially    Drug use: No     Medication list has been reviewed and updated.  Current Meds  Medication Sig   Ascorbic Acid (VITAMIN C) 500 MG CAPS Take 1 capsule by mouth 2 (two) times daily.    aspirin (ASPIRIN LOW DOSE) 81 MG EC tablet Take 1 tablet (81 mg total) by mouth daily. Swallow whole.   Cholecalciferol (VITAMIN D3) 2000 UNITS capsule Take 1 capsule by mouth daily.   Coenzyme Q10 (COQ-10) 100 MG CAPS Take 1 capsule by mouth daily.   dutasteride (AVODART) 0.5 MG capsule Take 1 capsule (0.5 mg total) by mouth daily.   hydrochlorothiazide (MICROZIDE) 12.5 MG capsule TAKE 1 CAPSULE BY MOUTH EVERY DAY   Multiple Vitamins-Minerals (CENTRUM SILVER ADULT 50+ PO) Take 1 tablet by mouth daily.   NIFEdipine (PROCARDIA-XL/NIFEDICAL-XL) 30 MG 24 hr tablet Take 1 tablet (30 mg total) by mouth daily.   Omega-3 Fatty Acids (FISH OIL) 1000 MG CAPS Take 1 capsule (1,000 mg total) by mouth 2 (two) times daily.   simvastatin (ZOCOR) 20 MG tablet Take 1 tablet (20 mg total) by mouth daily.   tadalafil (CIALIS) 20 MG tablet 0.5-1 tab 1 hour prior to intercourse   Zinc 50 MG TABS Take 50 mg by mouth daily.       10/12/2022   11:15 AM 08/29/2022    1:41 PM 04/11/2022    1:22 PM 12/03/2021   10:20 AM  GAD 7 : Generalized Anxiety Score  Nervous, Anxious, on Edge 0 0 0 0  Control/stop worrying 0 0 0 0  Worry too much - different things 0 0 0 0  Trouble relaxing 0 0 0 0  Restless 0 0 0 0  Easily annoyed or irritable 0 0 0 0  Afraid - awful might happen 0 0 0 0  Total GAD 7 Score 0 0 0 0  Anxiety Difficulty Not difficult at all Not difficult at all Not difficult at all        02/15/2023   10:53 AM 10/12/2022   11:15 AM 08/29/2022    1:41 PM  Depression screen PHQ 2/9  Decreased Interest 0 0 0  Down, Depressed, Hopeless 0 1 0  PHQ - 2 Score 0 1 0  Altered sleeping 0 0 0  Tired, decreased energy 0 0 0  Change in appetite 0 0 0  Feeling bad or failure about yourself  0 0 0  Trouble concentrating 0 0 0   Moving slowly or fidgety/restless 0 0 0  Suicidal thoughts 0 0 0  PHQ-9 Score 0 1 0  Difficult doing work/chores Not difficult at all Not difficult at all Not difficult at all    BP Readings from Last 3 Encounters:  04/17/23 (!) 146/78  02/15/23 138/78  10/12/22 120/70    Physical Exam Vitals and nursing note reviewed.  HENT:     Head: Normocephalic.     Right Ear: External ear normal.     Left Ear: External ear normal.     Nose: Nose normal.     Mouth/Throat:     Mouth: Mucous  membranes are moist.  Eyes:     General: No scleral icterus.       Right eye: No discharge.        Left eye: No discharge.     Conjunctiva/sclera: Conjunctivae normal.     Pupils: Pupils are equal, round, and reactive to light.  Neck:     Thyroid: No thyromegaly.     Vascular: No JVD.     Trachea: No tracheal deviation.  Cardiovascular:     Rate and Rhythm: Normal rate and regular rhythm.     Heart sounds: Normal heart sounds. No murmur heard.    No friction rub. No gallop.  Pulmonary:     Effort: No respiratory distress.     Breath sounds: Normal breath sounds. No wheezing, rhonchi or rales.  Abdominal:     General: Bowel sounds are normal.     Palpations: Abdomen is soft. There is no mass.     Tenderness: There is no abdominal tenderness. There is no guarding or rebound.  Musculoskeletal:        General: No tenderness. Normal range of motion.     Cervical back: Normal range of motion and neck supple.  Lymphadenopathy:     Cervical: No cervical adenopathy.  Skin:    General: Skin is warm.     Findings: No rash.  Neurological:     Mental Status: He is alert and oriented to person, place, and time.     Cranial Nerves: No cranial nerve deficit.     Deep Tendon Reflexes: Reflexes are normal and symmetric.     Wt Readings from Last 3 Encounters:  04/17/23 177 lb (80.3 kg)  02/15/23 174 lb 6.4 oz (79.1 kg)  10/12/22 178 lb (80.7 kg)    BP (!) 146/78   Pulse 68   Ht 5\' 8"  (1.727 m)    Wt 177 lb (80.3 kg)   SpO2 98%   BMI 26.91 kg/m   Assessment and Plan: 1. Essential (primary) hypertension Chronic.  Uncontrolled.  Stable.  Blood pressure 148/78 and on repeat it was about the same.  Patient has been complaining of some swelling in his feet this may be due to the nifedipine or could be dependent edema and that it goes down at the the beginning of the next day.  Therefore it is unlikely that I will be able to increase the calcium channel blocker therefore we will increase the hydrochlorothiazide to 25 mg once a day and we will recheck blood pressure in 6 weeks.  On review is also noted that because of hyperkalemia we discontinued the ACE inhibitor which then came back into the normal range as well and make that this would be a cautionary at in the future if necessary.  Today's weight is the most habits (this may reflect also some increase in fluid as well patient is also been cautioned to decrease sodium intake and to be vigilant of this. - NIFEdipine (PROCARDIA-XL/NIFEDICAL-XL) 30 MG 24 hr tablet; Take 1 tablet (30 mg total) by mouth daily.  Dispense: 90 tablet; Refill: 1 - hydrochlorothiazide (HYDRODIURIL) 25 MG tablet; Take 1 tablet (25 mg total) by mouth daily.  Dispense: 90 tablet; Refill: 0  2. Familial multiple lipoprotein-type hyperlipidemia Chronic.  Controlled.  Stable.  Continue simvastatin 20 mg once a day.  Will recheck with lipid panel in 6 weeks fasting - simvastatin (ZOCOR) 20 MG tablet; Take 1 tablet (20 mg total) by mouth daily.  Dispense: 90 tablet; Refill: 1  Elizabeth Sauer, MD

## 2023-04-19 DIAGNOSIS — M5417 Radiculopathy, lumbosacral region: Secondary | ICD-10-CM | POA: Diagnosis not present

## 2023-04-19 DIAGNOSIS — M9905 Segmental and somatic dysfunction of pelvic region: Secondary | ICD-10-CM | POA: Diagnosis not present

## 2023-04-19 DIAGNOSIS — M9903 Segmental and somatic dysfunction of lumbar region: Secondary | ICD-10-CM | POA: Diagnosis not present

## 2023-04-19 DIAGNOSIS — M4306 Spondylolysis, lumbar region: Secondary | ICD-10-CM | POA: Diagnosis not present

## 2023-05-17 DIAGNOSIS — M4306 Spondylolysis, lumbar region: Secondary | ICD-10-CM | POA: Diagnosis not present

## 2023-05-17 DIAGNOSIS — M9905 Segmental and somatic dysfunction of pelvic region: Secondary | ICD-10-CM | POA: Diagnosis not present

## 2023-05-17 DIAGNOSIS — M5417 Radiculopathy, lumbosacral region: Secondary | ICD-10-CM | POA: Diagnosis not present

## 2023-05-17 DIAGNOSIS — M9903 Segmental and somatic dysfunction of lumbar region: Secondary | ICD-10-CM | POA: Diagnosis not present

## 2023-06-09 ENCOUNTER — Ambulatory Visit (INDEPENDENT_AMBULATORY_CARE_PROVIDER_SITE_OTHER): Payer: Medicare Other | Admitting: Family Medicine

## 2023-06-09 ENCOUNTER — Encounter: Payer: Self-pay | Admitting: Family Medicine

## 2023-06-09 VITALS — BP 122/76 | HR 78 | Ht 68.0 in | Wt 176.0 lb

## 2023-06-09 DIAGNOSIS — E7849 Other hyperlipidemia: Secondary | ICD-10-CM | POA: Diagnosis not present

## 2023-06-09 DIAGNOSIS — I10 Essential (primary) hypertension: Secondary | ICD-10-CM | POA: Diagnosis not present

## 2023-06-09 DIAGNOSIS — Z23 Encounter for immunization: Secondary | ICD-10-CM

## 2023-06-09 MED ORDER — HYDROCHLOROTHIAZIDE 25 MG PO TABS
25.0000 mg | ORAL_TABLET | Freq: Every day | ORAL | 1 refills | Status: DC
Start: 2023-06-09 — End: 2023-06-16

## 2023-06-09 MED ORDER — SIMVASTATIN 20 MG PO TABS
20.0000 mg | ORAL_TABLET | Freq: Every day | ORAL | 1 refills | Status: DC
Start: 2023-06-09 — End: 2023-11-17

## 2023-06-09 MED ORDER — NIFEDIPINE ER OSMOTIC RELEASE 30 MG PO TB24
30.0000 mg | ORAL_TABLET | Freq: Every day | ORAL | 1 refills | Status: DC
Start: 2023-06-09 — End: 2023-11-17

## 2023-06-09 NOTE — Progress Notes (Signed)
Date:  06/09/2023   Name:  Alfred Edwards   DOB:  [birth year]   MRN:  [id number]   Chief Complaint: Hypertension and Hyperlipidemia  Hypertension This is a chronic problem. The current episode started more than 1 year ago. The problem has been gradually improving since onset. The problem is controlled. Pertinent negatives include no anxiety, blurred vision, chest pain, headaches, malaise/fatigue, neck pain, orthopnea, palpitations, peripheral edema, PND, shortness of breath or sweats. There are no associated agents to hypertension. There are no known risk factors for coronary artery disease. Past treatments include diuretics.  Hyperlipidemia Pertinent negatives include no chest pain or shortness of breath.    Lab Results  Component Value Date   NA 143 10/12/2022   K 5.0 10/12/2022   CO2 24 10/12/2022   GLUCOSE 111 (H) 10/12/2022   BUN 18 10/12/2022   CREATININE 1.16 10/12/2022   CALCIUM 9.5 10/12/2022   EGFR 66 10/12/2022   GFRNONAA 67 08/24/2020   Lab Results  Component Value Date   CHOL 186 04/11/2022   HDL 79 04/11/2022   LDLCALC 90 04/11/2022   TRIG 95 04/11/2022   CHOLHDL 2.1 08/21/2019   No results found for: "TSH" Lab Results  Component Value Date   HGBA1C 5.5 11/28/2018   Lab Results  Component Value Date   WBC 7.3 11/28/2018   HGB 12.8 (L) 11/28/2018   HCT 39.7 11/28/2018   MCV 84.8 11/28/2018   PLT 264 11/28/2018   Lab Results  Component Value Date   ALT 14 04/11/2022   AST 14 04/11/2022   ALKPHOS 53 04/11/2022   BILITOT 0.8 04/11/2022   No results found for: "25OHVITD2", "25OHVITD3", "VD25OH"   Review of Systems  Constitutional:  Negative for fatigue and malaise/fatigue.  HENT:  Negative for trouble swallowing.   Eyes:  Negative for blurred vision.  Respiratory:  Negative for cough, chest tightness, shortness of breath and wheezing.   Cardiovascular:  Negative for chest pain, palpitations, orthopnea, leg swelling and PND.  Gastrointestinal:   Negative for abdominal pain and blood in stool.  Genitourinary:  Negative for hematuria.  Musculoskeletal:  Negative for neck pain.  Neurological:  Negative for headaches.    Patient Active Problem List   Diagnosis Date Noted   Rotator cuff impingement syndrome of right shoulder 12/03/2021   Biceps tendinitis, right 12/03/2021   CVA (cerebral vascular accident) (HCC) 11/29/2018   TIA (transient ischemic attack) 11/28/2018   Elevated PSA 08/28/2017   Erectile dysfunction 08/28/2017   Benign prostatic hyperplasia with lower urinary tract symptoms 08/28/2017   Venous insufficiency 02/02/2017   Essential hypertension 12/28/2015   Hyperlipidemia 12/28/2015   Nocturia associated with benign prostatic hypertrophy 12/28/2015   Familial multiple lipoprotein-type hyperlipidemia 01/23/2015   Anxiety disorder due to known physiological condition 01/23/2015   Routine general medical examination at a health care facility 01/23/2015   Adjustment reaction 01/23/2015   Essential (primary) hypertension 01/23/2015   Proteinuria 06/01/2012    No Known Allergies  Past Surgical History:  Procedure Laterality Date   COLONOSCOPY  2005, 01/2015   Dr Mechele Collin- cleared for 3 yrs   TONSILLECTOMY     UPPER GASTROINTESTINAL ENDOSCOPY  01/2015   Dr Mechele Collin- erosions on esophagus    Social History   Tobacco Use   Smoking status: Former    Current packs/day: 0.00    Types: Cigarettes    Quit date: 09/13/1971    Years since quitting: 51.7   Smokeless tobacco: Never  Vaping Use  Vaping status: Never Used  Substance Use Topics   Alcohol use: Yes    Alcohol/week: 0.0 standard drinks of alcohol    Comment: socially   Drug use: No     Medication list has been reviewed and updated.  Current Meds  Medication Sig   Ascorbic Acid (VITAMIN C) 500 MG CAPS Take 1 capsule by mouth 2 (two) times daily.    aspirin (ASPIRIN LOW DOSE) 81 MG EC tablet Take 1 tablet (81 mg total) by mouth daily. Swallow  whole.   Cholecalciferol (VITAMIN D3) 2000 UNITS capsule Take 1 capsule by mouth daily.   Coenzyme Q10 (COQ-10) 100 MG CAPS Take 1 capsule by mouth daily.   dutasteride (AVODART) 0.5 MG capsule Take 1 capsule (0.5 mg total) by mouth daily.   hydrochlorothiazide (HYDRODIURIL) 25 MG tablet Take 1 tablet (25 mg total) by mouth daily.   Multiple Vitamins-Minerals (CENTRUM SILVER ADULT 50+ PO) Take 1 tablet by mouth daily.   NIFEdipine (PROCARDIA-XL/NIFEDICAL-XL) 30 MG 24 hr tablet Take 1 tablet (30 mg total) by mouth daily.   Omega-3 Fatty Acids (FISH OIL) 1000 MG CAPS Take 1 capsule (1,000 mg total) by mouth 2 (two) times daily.   simvastatin (ZOCOR) 20 MG tablet Take 1 tablet (20 mg total) by mouth daily.   tadalafil (CIALIS) 20 MG tablet 0.5-1 tab 1 hour prior to intercourse   Zinc 50 MG TABS Take 50 mg by mouth daily.       06/09/2023    9:39 AM 10/12/2022   11:15 AM 08/29/2022    1:41 PM 04/11/2022    1:22 PM  GAD 7 : Generalized Anxiety Score  Nervous, Anxious, on Edge 0 0 0 0  Control/stop worrying 0 0 0 0  Worry too much - different things 0 0 0 0  Trouble relaxing 0 0 0 0  Restless 0 0 0 0  Easily annoyed or irritable 0 0 0 0  Afraid - awful might happen 0 0 0 0  Total GAD 7 Score 0 0 0 0  Anxiety Difficulty Not difficult at all Not difficult at all Not difficult at all Not difficult at all       06/09/2023    9:38 AM 02/15/2023   10:53 AM 10/12/2022   11:15 AM  Depression screen PHQ 2/9  Decreased Interest 0 0 0  Down, Depressed, Hopeless 0 0 1  PHQ - 2 Score 0 0 1  Altered sleeping 0 0 0  Tired, decreased energy 0 0 0  Change in appetite 0 0 0  Feeling bad or failure about yourself  0 0 0  Trouble concentrating 0 0 0  Moving slowly or fidgety/restless 0 0 0  Suicidal thoughts 0 0 0  PHQ-9 Score 0 0 1  Difficult doing work/chores Not difficult at all Not difficult at all Not difficult at all    BP Readings from Last 3 Encounters:  06/09/23 122/76  04/17/23 (!)  146/78  02/15/23 138/78    Physical Exam Vitals and nursing note reviewed.  HENT:     Head: Normocephalic.     Right Ear: Tympanic membrane and external ear normal.     Left Ear: Tympanic membrane and external ear normal.     Nose: Nose normal. No congestion or rhinorrhea.     Mouth/Throat:     Mouth: Mucous membranes are moist.  Eyes:     General: No scleral icterus.       Right eye: No discharge.  Left eye: No discharge.     Conjunctiva/sclera: Conjunctivae normal.     Pupils: Pupils are equal, round, and reactive to light.  Neck:     Thyroid: No thyromegaly.     Vascular: No JVD.     Trachea: No tracheal deviation.  Cardiovascular:     Rate and Rhythm: Normal rate and regular rhythm.     Heart sounds: Normal heart sounds. No murmur heard.    No friction rub. No gallop.  Pulmonary:     Effort: No respiratory distress.     Breath sounds: Normal breath sounds. No wheezing or rales.  Chest:     Chest wall: No tenderness.  Abdominal:     General: Bowel sounds are normal. There is no distension.     Palpations: Abdomen is soft. There is no mass.     Tenderness: There is no abdominal tenderness. There is no guarding or rebound.  Musculoskeletal:        General: No tenderness. Normal range of motion.     Cervical back: Normal range of motion and neck supple.  Lymphadenopathy:     Cervical: No cervical adenopathy.  Skin:    General: Skin is warm.     Findings: No rash.  Neurological:     Mental Status: He is alert.     Wt Readings from Last 3 Encounters:  06/09/23 176 lb (79.8 kg)  04/17/23 177 lb (80.3 kg)  02/15/23 174 lb 6.4 oz (79.1 kg)    BP 122/76   Pulse 78   Ht 5\' 8"  (1.727 m)   Wt 176 lb (79.8 kg)   SpO2 98%   BMI 26.76 kg/m   Assessment and Plan:  1. Essential (primary) hypertension Chronic.  Controlled.  Stable.  Asymptomatic.  Blood pressure today is 122/76.  Tolerating medications well.  Continue hydrochlorothiazide 25 mg once a day and  nifedipine XL 30 mg once a day.  Will obtain CMP for electrolytes and GFR.  Will recheck patient in 6 months. - Lipid Panel With LDL/HDL Ratio - Comprehensive Metabolic Panel (CMET) - hydrochlorothiazide (HYDRODIURIL) 25 MG tablet; Take 1 tablet (25 mg total) by mouth daily.  Dispense: 90 tablet; Refill: 1 - NIFEdipine (PROCARDIA-XL/NIFEDICAL-XL) 30 MG 24 hr tablet; Take 1 tablet (30 mg total) by mouth daily.  Dispense: 90 tablet; Refill: 1  2. Familial multiple lipoprotein-type hyperlipidemia Chronic.  Controlled.  Stable.  Asymptomatic.  Without myalgias.  Continue simvastatin 20 mg once a day.  Will recheck patient in 6 months. - Comprehensive Metabolic Panel (CMET) - simvastatin (ZOCOR) 20 MG tablet; Take 1 tablet (20 mg total) by mouth daily.  Dispense: 90 tablet; Refill: 1  3. Need for immunization against influenza Discussed and administered - Flu Vaccine Trivalent High Dose (Fluad)    Elizabeth Sauer, MD

## 2023-06-10 ENCOUNTER — Encounter: Payer: Self-pay | Admitting: Family Medicine

## 2023-06-10 LAB — COMPREHENSIVE METABOLIC PANEL
ALT: 17 [IU]/L (ref 0–44)
AST: 17 [IU]/L (ref 0–40)
Albumin: 4 g/dL (ref 3.8–4.8)
Alkaline Phosphatase: 54 [IU]/L (ref 44–121)
BUN/Creatinine Ratio: 13 (ref 10–24)
BUN: 13 mg/dL (ref 8–27)
Bilirubin Total: 0.5 mg/dL (ref 0.0–1.2)
CO2: 23 mmol/L (ref 20–29)
Calcium: 9.3 mg/dL (ref 8.6–10.2)
Chloride: 104 mmol/L (ref 96–106)
Creatinine, Ser: 0.98 mg/dL (ref 0.76–1.27)
Globulin, Total: 2.7 g/dL (ref 1.5–4.5)
Glucose: 91 mg/dL (ref 70–99)
Potassium: 4.9 mmol/L (ref 3.5–5.2)
Sodium: 142 mmol/L (ref 134–144)
Total Protein: 6.7 g/dL (ref 6.0–8.5)
eGFR: 80 mL/min/{1.73_m2} (ref >59)

## 2023-06-10 LAB — LIPID PANEL WITH LDL/HDL RATIO
Cholesterol, Total: 161 mg/dL (ref 100–199)
HDL: 81 mg/dL (ref >39)
LDL Chol Calc (NIH): 68 mg/dL (ref 0–99)
LDL/HDL Ratio: 0.8 {ratio} (ref 0.0–3.6)
Triglycerides: 61 mg/dL (ref 0–149)
VLDL Cholesterol Cal: 12 mg/dL (ref 5–40)

## 2023-06-16 ENCOUNTER — Other Ambulatory Visit: Payer: Self-pay | Admitting: Family Medicine

## 2023-06-16 DIAGNOSIS — I10 Essential (primary) hypertension: Secondary | ICD-10-CM

## 2023-06-19 ENCOUNTER — Other Ambulatory Visit: Payer: Self-pay

## 2023-06-19 ENCOUNTER — Ambulatory Visit: Payer: Self-pay | Admitting: *Deleted

## 2023-06-19 DIAGNOSIS — I10 Essential (primary) hypertension: Secondary | ICD-10-CM

## 2023-06-19 MED ORDER — HYDROCHLOROTHIAZIDE 25 MG PO TABS
25.0000 mg | ORAL_TABLET | Freq: Every day | ORAL | 0 refills | Status: DC
Start: 2023-06-19 — End: 2023-11-17

## 2023-06-19 NOTE — Telephone Encounter (Signed)
Summary: hydrochlorothiazide (MICROZIDE) 12.5 MG capsule   The patient called in wanting to speak with his provider about the medication prescribed. He states the wrong dosage was called in. He says the hydrochlorothiazide (MICROZIDE) 12.5 MG capsule was called in but that is what he used to be on. He was switched over to the 25 mg a couple of months ago and states they talked about this and that it was helping so this is the one he wanted called in not the 12.5 that was called in. Please assist patient further      Called patient [phone] to review medication dose change. 06/09/23 PCP OV notes reports continue hydrochlorothiazide  25 mg once a day and nifedipine XL 30 mg  once a day for hypertension management.  Last ordered medication on 06/16/23 was hydrochlorothiazide 12.5 mg instead of 25 mg once daily. Patient requesting to clarify dose. No answer, LVMTCB [phone].

## 2023-06-19 NOTE — Telephone Encounter (Signed)
Patient informed per last OV note, he is supposed to be taking 25mg  daily. Sent this in to pharmacy.  - Cheyanna Strick

## 2023-06-19 NOTE — Telephone Encounter (Signed)
Reason for Disposition  [1] Caller has URGENT medicine question about med that PCP or specialist prescribed AND [2] triager unable to answer question  Answer Assessment - Initial Assessment Questions 1. NAME of MEDICINE: "What medicine(s) are you calling about?"     Microzide 2. QUESTION: "What is your question?" (e.g., double dose of medicine, side effect)     Dr. Yetta Barre changed the dosage last Friday.   It's supposed to be 25 mg not 12.5 mg.   Please send in for 25 mg instead of the 12.5 mg.    See his message 3. PRESCRIBER: "Who prescribed the medicine?" Reason: if prescribed by specialist, call should be referred to that group.     Dr. Yetta Barre 4. SYMPTOMS: "Do you have any symptoms?" If Yes, ask: "What symptoms are you having?"  "How bad are the symptoms (e.g., mild, moderate, severe)     N/A 5. PREGNANCY:  "Is there any chance that you are pregnant?" "When was your last menstrual period?"     N/A  Protocols used: Medication Question Call-A-AH  Chief Complaint: Microzide rx needs to be for 25 mg not the old dose of 12.5 mg.   Symptoms: N/A Frequency: N/A Pertinent Negatives: Patient denies N/A Disposition: [] ED /[] Urgent Care (no appt availability in office) / [] Appointment(In office/virtual)/ []  [facility]/ [] Home Care/ [] Refused Recommended Disposition /[] [place] [facility]/ [x]  Follow-up with PCP Additional Notes: Message sent to Dr. Yetta Barre.

## 2023-06-21 DIAGNOSIS — M4306 Spondylolysis, lumbar region: Secondary | ICD-10-CM | POA: Diagnosis not present

## 2023-06-21 DIAGNOSIS — M5417 Radiculopathy, lumbosacral region: Secondary | ICD-10-CM | POA: Diagnosis not present

## 2023-06-21 DIAGNOSIS — M9903 Segmental and somatic dysfunction of lumbar region: Secondary | ICD-10-CM | POA: Diagnosis not present

## 2023-06-21 DIAGNOSIS — M9905 Segmental and somatic dysfunction of pelvic region: Secondary | ICD-10-CM | POA: Diagnosis not present

## 2023-07-05 DIAGNOSIS — K219 Gastro-esophageal reflux disease without esophagitis: Secondary | ICD-10-CM | POA: Diagnosis not present

## 2023-07-05 DIAGNOSIS — Z860101 Personal history of adenomatous and serrated colon polyps: Secondary | ICD-10-CM | POA: Diagnosis not present

## 2023-07-19 DIAGNOSIS — Z86018 Personal history of other benign neoplasm: Secondary | ICD-10-CM | POA: Diagnosis not present

## 2023-07-19 DIAGNOSIS — L570 Actinic keratosis: Secondary | ICD-10-CM | POA: Diagnosis not present

## 2023-07-19 DIAGNOSIS — D485 Neoplasm of uncertain behavior of skin: Secondary | ICD-10-CM | POA: Diagnosis not present

## 2023-07-19 DIAGNOSIS — Z872 Personal history of diseases of the skin and subcutaneous tissue: Secondary | ICD-10-CM | POA: Diagnosis not present

## 2023-07-19 DIAGNOSIS — L578 Other skin changes due to chronic exposure to nonionizing radiation: Secondary | ICD-10-CM | POA: Diagnosis not present

## 2023-07-19 DIAGNOSIS — L820 Inflamed seborrheic keratosis: Secondary | ICD-10-CM | POA: Diagnosis not present

## 2023-07-19 DIAGNOSIS — L821 Other seborrheic keratosis: Secondary | ICD-10-CM | POA: Diagnosis not present

## 2023-07-26 DIAGNOSIS — M9905 Segmental and somatic dysfunction of pelvic region: Secondary | ICD-10-CM | POA: Diagnosis not present

## 2023-07-26 DIAGNOSIS — M5417 Radiculopathy, lumbosacral region: Secondary | ICD-10-CM | POA: Diagnosis not present

## 2023-07-26 DIAGNOSIS — M4306 Spondylolysis, lumbar region: Secondary | ICD-10-CM | POA: Diagnosis not present

## 2023-07-26 DIAGNOSIS — M9903 Segmental and somatic dysfunction of lumbar region: Secondary | ICD-10-CM | POA: Diagnosis not present

## 2023-08-23 DIAGNOSIS — M9903 Segmental and somatic dysfunction of lumbar region: Secondary | ICD-10-CM | POA: Diagnosis not present

## 2023-08-23 DIAGNOSIS — M9905 Segmental and somatic dysfunction of pelvic region: Secondary | ICD-10-CM | POA: Diagnosis not present

## 2023-08-23 DIAGNOSIS — M4306 Spondylolysis, lumbar region: Secondary | ICD-10-CM | POA: Diagnosis not present

## 2023-08-23 DIAGNOSIS — M5417 Radiculopathy, lumbosacral region: Secondary | ICD-10-CM | POA: Diagnosis not present

## 2023-09-12 ENCOUNTER — Other Ambulatory Visit: Payer: Self-pay | Admitting: Family Medicine

## 2023-09-12 DIAGNOSIS — I10 Essential (primary) hypertension: Secondary | ICD-10-CM

## 2023-09-15 ENCOUNTER — Other Ambulatory Visit: Payer: Self-pay

## 2023-09-15 DIAGNOSIS — N401 Enlarged prostate with lower urinary tract symptoms: Secondary | ICD-10-CM

## 2023-09-15 DIAGNOSIS — R972 Elevated prostate specific antigen [PSA]: Secondary | ICD-10-CM

## 2023-09-18 ENCOUNTER — Other Ambulatory Visit: Payer: Medicare Other

## 2023-09-18 DIAGNOSIS — N401 Enlarged prostate with lower urinary tract symptoms: Secondary | ICD-10-CM

## 2023-09-18 DIAGNOSIS — R972 Elevated prostate specific antigen [PSA]: Secondary | ICD-10-CM

## 2023-09-19 LAB — PSA: Prostate Specific Ag, Serum: 3.2 ng/mL (ref 0.0–4.0)

## 2023-09-20 DIAGNOSIS — M4306 Spondylolysis, lumbar region: Secondary | ICD-10-CM | POA: Diagnosis not present

## 2023-09-20 DIAGNOSIS — M9905 Segmental and somatic dysfunction of pelvic region: Secondary | ICD-10-CM | POA: Diagnosis not present

## 2023-09-20 DIAGNOSIS — M5417 Radiculopathy, lumbosacral region: Secondary | ICD-10-CM | POA: Diagnosis not present

## 2023-09-20 DIAGNOSIS — M9903 Segmental and somatic dysfunction of lumbar region: Secondary | ICD-10-CM | POA: Diagnosis not present

## 2023-09-21 ENCOUNTER — Encounter: Payer: Self-pay | Admitting: Gastroenterology

## 2023-09-22 ENCOUNTER — Encounter: Payer: Self-pay | Admitting: Urology

## 2023-09-22 ENCOUNTER — Ambulatory Visit: Payer: Medicare Other | Admitting: Urology

## 2023-09-22 VITALS — BP 139/81 | HR 72 | Ht 68.0 in | Wt 172.0 lb

## 2023-09-22 DIAGNOSIS — R972 Elevated prostate specific antigen [PSA]: Secondary | ICD-10-CM | POA: Diagnosis not present

## 2023-09-22 DIAGNOSIS — N401 Enlarged prostate with lower urinary tract symptoms: Secondary | ICD-10-CM

## 2023-09-22 NOTE — Progress Notes (Signed)
 I, Maysun LITTIE Griffiths, acting as a scribe for Glendia JAYSON Barba, MD., have documented all relevant documentation on the behalf of Glendia JAYSON Barba, MD, as directed by Glendia JAYSON Barba, MD while in the presence of Glendia JAYSON Barba, MD.  09/22/2023 5:27 PM   Alfred Edwards 05/20/1946 [id number]  Referring provider: Joshua Cathryne JAYSON, MD [address] [place],  [place] [address]  Chief Complaint  Patient presents with   Elevated PSA   Urologic history: 1.  Elevated PSA Prostate biopsy 2008; PSA 6.2; 52 g gland; benign pathology   2.  BPH with lower urinary tract symptoms Dutasteride  3 times weekly   3.  Erectile dysfunction Generic tadalafil  20 mg  HPI: Alfred Edwards is a 78 y.o. male presents for annual follow-up.  Doing well since last visit Stable lower urinary tract symptoms on dutasteride .  Denies dysuria, gross hematuria Denies flank, abdominal or pelvic pain PSA 09/18/23 stable at 3.2 (uncorrected).  PSA trend   Prostate Specific Ag, Serum  Latest Ref Rng 0.0 - 4.0 ng/mL  08/28/2019 2.7   09/16/2020 2.5   09/20/2021 5.0 (H)   06/24/2022 2.7   03/29/2023 2.9   09/18/2023 3.2      PMH: Past Medical History:  Diagnosis Date   BPH (benign prostatic hypertrophy)    GERD (gastroesophageal reflux disease)    Hyperlipidemia    Hypertension    TIA (transient ischemic attack) 11/2018    Surgical History: Past Surgical History:  Procedure Laterality Date   COLONOSCOPY  2005, 01/2015   Dr Viktoria- cleared for 3 yrs   TONSILLECTOMY     UPPER GASTROINTESTINAL ENDOSCOPY  01/2015   Dr Viktoria- erosions on esophagus    Home Medications:  Allergies as of 09/22/2023   No Known Allergies      Medication List        Accurate as of September 22, 2023  5:27 PM. If you have any questions, ask your nurse or doctor.          aspirin  EC 81 MG tablet Commonly known as: ASPIRIN  LOW DOSE Take 1 tablet (81 mg total) by mouth daily. Swallow whole.   CENTRUM  SILVER ADULT 50+ PO Take 1 tablet by mouth daily.   CoQ-10 100 MG Caps Take 1 capsule by mouth daily.   dutasteride  0.5 MG capsule Commonly known as: AVODART  Take 1 capsule (0.5 mg total) by mouth daily.   Fish Oil  1000 MG Caps Take 1 capsule (1,000 mg total) by mouth 2 (two) times daily.   hydrochlorothiazide  25 MG tablet Commonly known as: HYDRODIURIL  Take 1 tablet (25 mg total) by mouth daily.   hydrochlorothiazide  12.5 MG capsule Commonly known as: MICROZIDE  TAKE 1 CAPSULE BY MOUTH EVERY DAY   NIFEdipine  30 MG 24 hr tablet Commonly known as: PROCARDIA -XL/NIFEDICAL-XL Take 1 tablet (30 mg total) by mouth daily.   simvastatin  20 MG tablet Commonly known as: ZOCOR  Take 1 tablet (20 mg total) by mouth daily.   tadalafil  20 MG tablet Commonly known as: CIALIS  0.5-1 tab 1 hour prior to intercourse   Vitamin C 500 MG Caps Take 1 capsule by mouth 2 (two) times daily.   Vitamin D3 50 MCG (2000 UT) capsule Take 1 capsule by mouth daily.   Zinc 50 MG Tabs Take 50 mg by mouth daily.        Allergies: No Known Allergies  Family History: Family History  Problem Relation Age of Onset   Heart disease  Father    Heart disease Brother     Social History:  reports that he quit smoking about 52 years ago. His smoking use included cigarettes. He has never used smokeless tobacco. He reports current alcohol use. He reports that he does not use drugs.   Physical Exam: BP 139/81   Pulse 72   Ht 5' 8 (1.727 m)   Wt 172 lb (78 kg)   BMI 26.15 kg/m   Constitutional:  Alert and oriented, No acute distress. HEENT: [place] AT GI: Abdomen is soft, nontender, nondistended, no abdominal masses Psychiatric: Normal mood and affect.  Assessment & Plan:    1. Elevated PSA Stable 1 year follow up with PSA.   2. BPH with LUTS Stable; did not need dutasteride  refills at this time.  I have reviewed the above documentation for accuracy and completeness, and I agree with the above.    Glendia JAYSON Barba, MD  [facility] [address], [address] [place], [place] [address] [phone]

## 2023-09-25 ENCOUNTER — Encounter: Payer: Self-pay | Admitting: Gastroenterology

## 2023-10-01 NOTE — H&P (Signed)
Pre-Procedure H&P   Patient ID: Alfred Edwards is a 78 y.o. male.  Gastroenterology Provider: Jaynie Collins, DO  Referring Provider: Fransico Setters, NP PCP: Duanne Limerick, MD  Date: 10/02/2023  HPI Mr. Alfred Edwards is a 78 y.o. male who presents today for Colonoscopy for Personal history of advanced colon polyps .  Last underwent colonoscopy in June 2019 with sigmoid diverticulosis and internal hemorrhoids. EGD and colonoscopy performed April 2016 with 1 tubulovillous adenoma and 3 tubular adenomas.  Grade C esophagitis, hiatal hernia and gastritis noted at that time.  Duodenitis also appreciated.  Biopsies negative for H. Pylori  No family history of colon cancer or colon polyps   Past Medical History:  Diagnosis Date   BPH (benign prostatic hypertrophy)    GERD (gastroesophageal reflux disease)    Hyperlipidemia    Hypertension    Stroke [facility])    TIA (transient ischemic attack) 11/2018    Past Surgical History:  Procedure Laterality Date   COLONOSCOPY  2005, 01/2015   Dr Mechele Collin- cleared for 3 yrs   TONSILLECTOMY     UPPER GASTROINTESTINAL ENDOSCOPY  01/2015   Dr Mechele Collin- erosions on esophagus    Family History No h/o GI disease or malignancy  Review of Systems  Constitutional:  Negative for activity change, appetite change, chills, diaphoresis, fatigue, fever and unexpected weight change.  HENT:  Negative for trouble swallowing and voice change.   Respiratory:  Negative for shortness of breath and wheezing.   Cardiovascular:  Negative for chest pain, palpitations and leg swelling.  Gastrointestinal:  Negative for abdominal distention, abdominal pain, anal bleeding, blood in stool, constipation, diarrhea, nausea and vomiting.  Musculoskeletal:  Negative for arthralgias and myalgias.  Skin:  Negative for color change and pallor.  Neurological:  Negative for dizziness, syncope and weakness.  Psychiatric/Behavioral:  Negative for confusion. The patient  is not nervous/anxious.   All other systems reviewed and are negative.    Medications No current facility-administered medications on file prior to encounter.   Current Outpatient Medications on File Prior to Encounter  Medication Sig Dispense Refill   NIFEdipine (PROCARDIA-XL/NIFEDICAL-XL) 30 MG 24 hr tablet Take 1 tablet (30 mg total) by mouth daily. 90 tablet 1   Ascorbic Acid (VITAMIN C) 500 MG CAPS Take 1 capsule by mouth 2 (two) times daily.      aspirin (ASPIRIN LOW DOSE) 81 MG EC tablet Take 1 tablet (81 mg total) by mouth daily. Swallow whole. 30 tablet 12   Cholecalciferol (VITAMIN D3) 2000 UNITS capsule Take 1 capsule by mouth daily.     Coenzyme Q10 (COQ-10) 100 MG CAPS Take 1 capsule by mouth daily.     dutasteride (AVODART) 0.5 MG capsule Take 1 capsule (0.5 mg total) by mouth daily. 90 capsule 3   hydrochlorothiazide (HYDRODIURIL) 25 MG tablet Take 1 tablet (25 mg total) by mouth daily. 90 tablet 0   Multiple Vitamins-Minerals (CENTRUM SILVER ADULT 50+ PO) Take 1 tablet by mouth daily.     Omega-3 Fatty Acids (FISH OIL) 1000 MG CAPS Take 1 capsule (1,000 mg total) by mouth 2 (two) times daily. 30 capsule 11   simvastatin (ZOCOR) 20 MG tablet Take 1 tablet (20 mg total) by mouth daily. 90 tablet 1   tadalafil (CIALIS) 20 MG tablet 0.5-1 tab 1 hour prior to intercourse 30 tablet 3   Zinc 50 MG TABS Take 50 mg by mouth daily.      Pertinent medications related to GI and  procedure were reviewed by me with the patient prior to the procedure   Current Facility-Administered Medications:    0.9 %  sodium chloride infusion, , Intravenous, Continuous, Jaynie Collins, DO, Last Rate: 20 mL/hr at 10/02/23 0855, New Bag at 10/02/23 0855  sodium chloride 20 mL/hr at 10/02/23 0855       No Known Allergies Allergies were reviewed by me prior to the procedure  Objective   Body mass index is 26.76 kg/m. Vitals:   10/02/23 0831 10/02/23 0844  BP:  (!) 179/77  Pulse:  68   Resp:  18  Temp:  97.6 F (36.4 C)  TempSrc:  Temporal  SpO2:  100%  Weight: 79.8 kg   Height: 5\' 8"  (1.727 m)      Physical Exam Vitals and nursing note reviewed.  Constitutional:      General: He is not in acute distress.    Appearance: Normal appearance. He is not ill-appearing, toxic-appearing or diaphoretic.  HENT:     Head: Normocephalic and atraumatic.     Nose: Nose normal.     Mouth/Throat:     Mouth: Mucous membranes are moist.     Pharynx: Oropharynx is clear.  Eyes:     General: No scleral icterus.    Extraocular Movements: Extraocular movements intact.  Cardiovascular:     Rate and Rhythm: Normal rate and regular rhythm.     Heart sounds: Normal heart sounds. No murmur heard.    No friction rub. No gallop.  Pulmonary:     Effort: Pulmonary effort is normal. No respiratory distress.     Breath sounds: Normal breath sounds. No wheezing, rhonchi or rales.  Abdominal:     General: Bowel sounds are normal. There is no distension.     Palpations: Abdomen is soft.     Tenderness: There is no abdominal tenderness. There is no guarding or rebound.  Musculoskeletal:     Cervical back: Neck supple.     Right lower leg: No edema.     Left lower leg: No edema.  Skin:    General: Skin is warm and dry.     Coloration: Skin is not jaundiced or pale.  Neurological:     General: No focal deficit present.     Mental Status: He is alert and oriented to person, place, and time. Mental status is at baseline.  Psychiatric:        Mood and Affect: Mood normal.        Behavior: Behavior normal.        Thought Content: Thought content normal.        Judgment: Judgment normal.      Assessment:  Mr. Alfred Edwards is a 78 y.o. male  who presents today for Colonoscopy for Personal history of advanced colon polyps .  Plan:  Colonoscopy with possible intervention today  Colonoscopy with possible biopsy, control of bleeding, polypectomy, and interventions as necessary  has been discussed with the patient/patient representative. Informed consent was obtained from the patient/patient representative after explaining the indication, nature, and risks of the procedure including but not limited to death, bleeding, perforation, missed neoplasm/lesions, cardiorespiratory compromise, and reaction to medications. Opportunity for questions was given and appropriate answers were provided. Patient/patient representative has verbalized understanding is amenable to undergoing the procedure.   Jaynie Collins, DO  [facility]  Portions of the record may have been created with voice recognition software. Occasional wrong-word or 'sound-a-like' substitutions may have occurred due to  the inherent limitations of voice recognition software.  Read the chart carefully and recognize, using context, where substitutions may have occurred.

## 2023-10-02 ENCOUNTER — Encounter
Admission: RE | Disposition: A | Discharge status: Home / Self Care | Payer: Self-pay | Source: Home / Self Care | Attending: Gastroenterology

## 2023-10-02 ENCOUNTER — Encounter: Payer: Self-pay | Admitting: Gastroenterology

## 2023-10-02 ENCOUNTER — Ambulatory Visit: Payer: Medicare Other | Admitting: Anesthesiology

## 2023-10-02 ENCOUNTER — Ambulatory Visit
Admission: RE | Admit: 2023-10-02 | Discharge: 2023-10-02 | Disposition: A | Discharge status: Home / Self Care | Payer: Medicare Other | Attending: Gastroenterology | Admitting: Gastroenterology

## 2023-10-02 DIAGNOSIS — Z860101 Personal history of adenomatous and serrated colon polyps: Secondary | ICD-10-CM | POA: Diagnosis not present

## 2023-10-02 DIAGNOSIS — K640 First degree hemorrhoids: Secondary | ICD-10-CM | POA: Diagnosis not present

## 2023-10-02 DIAGNOSIS — Z79899 Other long term (current) drug therapy: Secondary | ICD-10-CM | POA: Insufficient documentation

## 2023-10-02 DIAGNOSIS — Z1211 Encounter for screening for malignant neoplasm of colon: Secondary | ICD-10-CM | POA: Diagnosis not present

## 2023-10-02 DIAGNOSIS — K5730 Diverticulosis of large intestine without perforation or abscess without bleeding: Secondary | ICD-10-CM | POA: Diagnosis not present

## 2023-10-02 DIAGNOSIS — Z87891 Personal history of nicotine dependence: Secondary | ICD-10-CM | POA: Insufficient documentation

## 2023-10-02 DIAGNOSIS — K649 Unspecified hemorrhoids: Secondary | ICD-10-CM | POA: Diagnosis not present

## 2023-10-02 DIAGNOSIS — K219 Gastro-esophageal reflux disease without esophagitis: Secondary | ICD-10-CM | POA: Insufficient documentation

## 2023-10-02 DIAGNOSIS — Z09 Encounter for follow-up examination after completed treatment for conditions other than malignant neoplasm: Secondary | ICD-10-CM | POA: Diagnosis not present

## 2023-10-02 DIAGNOSIS — Z8673 Personal history of transient ischemic attack (TIA), and cerebral infarction without residual deficits: Secondary | ICD-10-CM | POA: Diagnosis not present

## 2023-10-02 DIAGNOSIS — I10 Essential (primary) hypertension: Secondary | ICD-10-CM | POA: Diagnosis not present

## 2023-10-02 HISTORY — DX: Cerebral infarction, unspecified: I63.9

## 2023-10-02 HISTORY — PX: COLONOSCOPY WITH PROPOFOL: SHX5780

## 2023-10-02 SURGERY — COLONOSCOPY WITH PROPOFOL
Anesthesia: General

## 2023-10-02 MED ORDER — SODIUM CHLORIDE 0.9 % IV SOLN: INTRAVENOUS | Status: DC

## 2023-10-02 MED ORDER — PROPOFOL 10 MG/ML IV BOLUS
INTRAVENOUS | Status: DC | PRN
  Administered 2023-10-02: 100 mg via INTRAVENOUS

## 2023-10-02 MED ORDER — PROPOFOL 500 MG/50ML IV EMUL
INTRAVENOUS | Status: DC | PRN
  Administered 2023-10-02: 150 ug/kg/min via INTRAVENOUS

## 2023-10-02 MED ORDER — PROPOFOL 1000 MG/100ML IV EMUL
INTRAVENOUS | Status: AC
  Filled 2023-10-02: qty 100

## 2023-10-02 NOTE — Anesthesia Preprocedure Evaluation (Signed)
Anesthesia Evaluation  Patient identified by MRN, date of birth, ID band Patient awake    Reviewed: Allergy & Precautions, NPO status , Patient's Chart, lab work & pertinent test results  Airway Mallampati: II  TM Distance: >3 FB Neck ROM: full    Dental  (+) Teeth Intact   Pulmonary neg pulmonary ROS, former smoker   Pulmonary exam normal breath sounds clear to auscultation       Cardiovascular Exercise Tolerance: Good hypertension, Pt. on medications negative cardio ROS Normal cardiovascular exam Rhythm:Regular Rate:Normal     Neuro/Psych   Anxiety     TIAnegative neurological ROS  negative psych ROS   GI/Hepatic negative GI ROS, Neg liver ROS,GERD  Medicated,,  Endo/Other  negative endocrine ROS    Renal/GU negative Renal ROS  negative genitourinary   Musculoskeletal   Abdominal  (+) + obese  Peds negative pediatric ROS (+)  Hematology negative hematology ROS (+)   Anesthesia Other Findings Past Medical History: No date: BPH (benign prostatic hypertrophy) No date: GERD (gastroesophageal reflux disease) No date: Hyperlipidemia No date: Hypertension No date: Stroke [facility]) 11/2018: TIA (transient ischemic attack)  Past Surgical History: 2005, 01/2015: COLONOSCOPY     Comment:  Dr Mechele Collin- cleared for 3 yrs No date: TONSILLECTOMY 01/2015: UPPER GASTROINTESTINAL ENDOSCOPY     Comment:  Dr Mechele Collin- erosions on esophagus  BMI    Body Mass Index: 26.76 kg/m      Reproductive/Obstetrics negative OB ROS                             Anesthesia Physical Anesthesia Plan  ASA: 3  Anesthesia Plan: General   Post-op Pain Management:    Induction: Intravenous  PONV Risk Score and Plan: Propofol infusion and TIVA  Airway Management Planned: Natural Airway and Nasal Cannula  Additional Equipment:   Intra-op Plan:   Post-operative Plan:   Informed Consent: I have reviewed the  patients History and Physical, chart, labs and discussed the procedure including the risks, benefits and alternatives for the proposed anesthesia with the patient or authorized representative who has indicated his/her understanding and acceptance.     Dental Advisory Given  Plan Discussed with: CRNA and Surgeon  Anesthesia Plan Comments:        Anesthesia Quick Evaluation

## 2023-10-02 NOTE — Interval H&P Note (Signed)
History and Physical Interval Note: Preprocedure H&P from 10/02/23  was reviewed and there was no interval change after seeing and examining the patient.  Written consent was obtained from the patient after discussion of risks, benefits, and alternatives. Patient has consented to proceed with Colonoscopy with possible intervention   10/02/2023 9:17 AM  Alfred Edwards  has presented today for surgery, with the diagnosis of Z86.0101 (ICD-10-CM) - Hx of adenomatous colonic polyps.  The various methods of treatment have been discussed with the patient and family. After consideration of risks, benefits and other options for treatment, the patient has consented to  Procedure(s): COLONOSCOPY WITH PROPOFOL (N/A) as a surgical intervention.  The patient's history has been reviewed, patient examined, no change in status, stable for surgery.  I have reviewed the patient's chart and labs.  Questions were answered to the patient's satisfaction.     Jaynie Collins

## 2023-10-02 NOTE — Anesthesia Postprocedure Evaluation (Signed)
Anesthesia Post Note  Patient: Alfred Edwards  Procedure(s) Performed: COLONOSCOPY WITH PROPOFOL  Patient location during evaluation: PACU Anesthesia Type: General Level of consciousness: awake and awake and alert Pain management: satisfactory to patient Vital Signs Assessment: post-procedure vital signs reviewed and stable Respiratory status: spontaneous breathing Cardiovascular status: blood pressure returned to baseline Anesthetic complications: no   There were no known notable events for this encounter.   Last Vitals:  Vitals:   10/02/23 0942 10/02/23 0952  BP: (!) 106/44 123/60  Pulse:    Resp:    Temp: (!) 36.1 C   SpO2:      Last Pain:  Vitals:   10/02/23 0952  TempSrc:   PainSc: 0-No pain                 VAN STAVEREN,Feather Berrie

## 2023-10-02 NOTE — Transfer of Care (Signed)
Immediate Anesthesia Transfer of Care Note  Patient: Alfred Edwards  Procedure(s) Performed: COLONOSCOPY WITH PROPOFOL  Patient Location: PACU  Anesthesia Type:General  Level of Consciousness: awake and sedated  Airway & Oxygen Therapy: Patient Spontanous Breathing and Patient connected to face mask oxygen  Post-op Assessment: Report given to RN and Post -op Vital signs reviewed and stable  Post vital signs: Reviewed and stable  Last Vitals:  Vitals Value Taken Time  BP    Temp    Pulse    Resp    SpO2      Last Pain:  Vitals:   10/02/23 0844  TempSrc: Temporal  PainSc: 0-No pain         Complications: There were no known notable events for this encounter.

## 2023-10-02 NOTE — Op Note (Signed)
[facility] Patient Name: Alfred Edwards Procedure Date: 10/02/2023 9:03 AM MRN: [id number] Account #: [id number] Date of Birth: 27-Apr-1946 Admit Type: Outpatient Age: 78 Room: [facility] ENDO ROOM 2 Gender: Male Note Status: Finalized Instrument Name: Colonoscope [id number] Procedure:             Colonoscopy Indications:           High risk colon cancer surveillance: Personal history                         of adenoma with villous component Providers:             Trenda Moots, DO Referring MD:          Duanne Limerick, MD (Referring MD) Medicines:             Monitored Anesthesia Care Complications:         No immediate complications. Estimated blood loss: None. Procedure:             Pre-Anesthesia Assessment:                        - Prior to the procedure, a History and Physical was                         performed, and patient medications and allergies were                         reviewed. The patient is competent. The risks and                         benefits of the procedure and the sedation options and                         risks were discussed with the patient. All questions                         were answered and informed consent was obtained.                         Patient identification and proposed procedure were                         verified by the physician, the nurse, the anesthetist                         and the technician in the endoscopy suite. Mental                         Status Examination: alert and oriented. Airway                         Examination: normal oropharyngeal airway and neck                         mobility. Respiratory Examination: clear to                         auscultation. CV Examination: RRR, no murmurs, no S3  or S4. Prophylactic Antibiotics: The patient does not                         require prophylactic antibiotics. Prior                          Anticoagulants: The patient has taken no anticoagulant                         or antiplatelet agents. ASA Grade Assessment: III - A                         patient with severe systemic disease. After reviewing                         the risks and benefits, the patient was deemed in                         satisfactory condition to undergo the procedure. The                         anesthesia plan was to use monitored anesthesia care                         (MAC). Immediately prior to administration of                         medications, the patient was re-assessed for adequacy                         to receive sedatives. The heart rate, respiratory                         rate, oxygen saturations, blood pressure, adequacy of                         pulmonary ventilation, and response to care were                         monitored throughout the procedure. The physical                         status of the patient was re-assessed after the                         procedure.                        After obtaining informed consent, the colonoscope was                         passed under direct vision. Throughout the procedure,                         the patient's blood pressure, pulse, and oxygen                         saturations were monitored continuously. The  Colonoscope was introduced through the anus and                         advanced to the the terminal ileum, with                         identification of the appendiceal orifice and IC                         valve. The colonoscopy was performed without                         difficulty. The patient tolerated the procedure well.                         The quality of the bowel preparation was evaluated                         using the BBPS [facility] Bowel Preparation Scale) with                         scores of: Right Colon = 2 (minor amount of residual                         staining, small fragments  of stool and/or opaque                         liquid, but mucosa seen well), Transverse Colon = 3                         (entire mucosa seen well with no residual staining,                         small fragments of stool or opaque liquid) and Left                         Colon = 2 (minor amount of residual staining, small                         fragments of stool and/or opaque liquid, but mucosa                         seen well). The total BBPS score equals 7. The quality                         of the bowel preparation was good. The ileocecal                         valve, appendiceal orifice, and rectum were                         photographed. Findings:      The perianal and digital rectal examinations were normal. Pertinent       negatives include normal sphincter tone.      Multiple small-mouthed diverticula were found in the entire colon. Most       significant in the left colon. Estimated blood loss: none.  A moderate amount of adherent stool was found in the ascending colon,       interfering with visualization. Lavage of the area was performed using a       large amount, resulting in clearance with good visualization. Estimated       blood loss: none.      Non-bleeding internal hemorrhoids were found during retroflexion. The       hemorrhoids were Grade I (internal hemorrhoids that do not prolapse).       Estimated blood loss: none.      The exam was otherwise without abnormality on direct and retroflexion       views. Impression:            - Diverticulosis in the entire examined colon.                        - Stool in the ascending colon.                        - Non-bleeding internal hemorrhoids.                        - The examination was otherwise normal on direct and                         retroflexion views.                        - No specimens collected. Recommendation:        - Patient has a contact number available for                          emergencies. The signs and symptoms of potential                         delayed complications were discussed with the patient.                         Return to normal activities tomorrow. Written                         discharge instructions were provided to the patient.                        - Discharge patient to home.                        - Resume previous diet.                        - Continue present medications.                        - Repeat colonoscopy in 5 years for surveillance.                        - Return to referring physician as previously                         scheduled.                        -  The findings and recommendations were discussed with                         the patient. Procedure Code(s):     --- Professional ---                        [phone], Colonoscopy, flexible; diagnostic, including                         collection of specimen(s) by brushing or washing, when                         performed (separate procedure) Diagnosis Code(s):     --- Professional ---                        Z86.010, Personal history of colonic polyps                        K64.0, First degree hemorrhoids                        K57.30, Diverticulosis of large intestine without                         perforation or abscess without bleeding CPT copyright 2022 American Medical Association. All rights reserved. The codes documented in this report are preliminary and upon coder review may  be revised to meet current compliance requirements. Attending Participation:      I personally performed the entire procedure. Elfredia Nevins, DO Jaynie Collins DO, DO 10/02/2023 9:43:24 AM This report has been signed electronically. Number of Addenda: 0 Note Initiated On: 10/02/2023 9:03 AM Scope Withdrawal Time: 0 hours 13 minutes 46 seconds  Total Procedure Duration: 0 hours 17 minutes 10 seconds  Estimated Blood Loss:  Estimated blood loss: none.      [facility]

## 2023-10-03 ENCOUNTER — Encounter: Payer: Self-pay | Admitting: Gastroenterology

## 2023-11-17 ENCOUNTER — Encounter: Payer: Self-pay | Admitting: Family Medicine

## 2023-11-17 ENCOUNTER — Ambulatory Visit: Payer: Medicare Other | Admitting: Family Medicine

## 2023-11-17 DIAGNOSIS — E7849 Other hyperlipidemia: Secondary | ICD-10-CM

## 2023-11-17 DIAGNOSIS — I10 Essential (primary) hypertension: Secondary | ICD-10-CM | POA: Diagnosis not present

## 2023-11-17 MED ORDER — SIMVASTATIN 20 MG PO TABS
20.0000 mg | ORAL_TABLET | Freq: Every day | ORAL | 1 refills | Status: DC
Start: 2023-11-17 — End: 2024-07-24

## 2023-11-17 MED ORDER — HYDROCHLOROTHIAZIDE 25 MG PO TABS: 25.0000 mg | ORAL_TABLET | Freq: Every day | ORAL | 1 refills | Status: DC

## 2023-11-17 MED ORDER — NIFEDIPINE ER OSMOTIC RELEASE 30 MG PO TB24: 30.0000 mg | ORAL_TABLET | Freq: Every day | ORAL | 1 refills | Status: DC

## 2023-11-17 NOTE — Progress Notes (Signed)
 Date:  11/17/2023   Name:  Alfred Edwards   DOB:  1946/02/18   MRN:  [id number]   Chief Complaint: Hypertension (Patient presents today for refill on his HTN medication. He is doing well and has no concerns for today's visit. )  Hypertension This is a chronic problem. The current episode started more than 1 year ago. The problem has been gradually improving since onset. The problem is uncontrolled. Pertinent negatives include no anxiety, blurred vision, chest pain, headaches, malaise/fatigue, neck pain, orthopnea, palpitations, peripheral edema, PND, shortness of breath or sweats. There are no associated agents to hypertension. There are no known risk factors for coronary artery disease. Past treatments include calcium channel blockers and diuretics. There are no compliance problems.  There is no history of CAD/MI or CVA. history TIA. There is no history of chronic renal disease, a hypertension causing med or renovascular disease.  Hyperlipidemia This is a chronic problem. The current episode started more than 1 year ago. The problem is controlled. Recent lipid tests were reviewed and are normal. He has no history of chronic renal disease, diabetes, hypothyroidism, liver disease, obesity or nephrotic syndrome. There are no known factors aggravating his hyperlipidemia. Pertinent negatives include no chest pain, focal sensory loss, focal weakness, leg pain, myalgias or shortness of breath. Current antihyperlipidemic treatment includes statins. The current treatment provides moderate improvement of lipids. There are no compliance problems.     Lab Results  Component Value Date   NA 142 06/09/2023   K 4.9 06/09/2023   CO2 23 06/09/2023   GLUCOSE 91 06/09/2023   BUN 13 06/09/2023   CREATININE 0.98 06/09/2023   CALCIUM 9.3 06/09/2023   EGFR 80 06/09/2023   GFRNONAA 67 08/24/2020   Lab Results  Component Value Date   CHOL 161 06/09/2023   HDL 81 06/09/2023   LDLCALC 68 06/09/2023   TRIG  61 06/09/2023   CHOLHDL 2.1 08/21/2019   No results found for: "TSH" Lab Results  Component Value Date   HGBA1C 5.5 11/28/2018   Lab Results  Component Value Date   WBC 7.3 11/28/2018   HGB 12.8 (L) 11/28/2018   HCT 39.7 11/28/2018   MCV 84.8 11/28/2018   PLT 264 11/28/2018   Lab Results  Component Value Date   ALT 17 06/09/2023   AST 17 06/09/2023   ALKPHOS 54 06/09/2023   BILITOT 0.5 06/09/2023   No results found for: "25OHVITD2", "25OHVITD3", "VD25OH"   Review of Systems  Constitutional:  Negative for diaphoresis, fever, malaise/fatigue and unexpected weight change.  HENT:  Negative for trouble swallowing.   Eyes:  Negative for blurred vision and visual disturbance.  Respiratory:  Negative for cough, chest tightness, shortness of breath and wheezing.   Cardiovascular:  Negative for chest pain, palpitations, orthopnea and PND.  Gastrointestinal:  Negative for abdominal pain and blood in stool.  Genitourinary:  Negative for difficulty urinating, frequency and hematuria.  Musculoskeletal:  Negative for myalgias and neck pain.  Neurological:  Negative for focal weakness and headaches.    Patient Active Problem List   Diagnosis Date Noted   Rotator cuff impingement syndrome of right shoulder 12/03/2021   Biceps tendinitis, right 12/03/2021   CVA (cerebral vascular accident) (HCC) 11/29/2018   TIA (transient ischemic attack) 11/28/2018   Elevated PSA 08/28/2017   Erectile dysfunction 08/28/2017   Benign prostatic hyperplasia with lower urinary tract symptoms 08/28/2017   Venous insufficiency 02/02/2017   Essential hypertension 12/28/2015   Hyperlipidemia 12/28/2015  Nocturia associated with benign prostatic hyperplasia 12/28/2015   Familial multiple lipoprotein-type hyperlipidemia 01/23/2015   Anxiety disorder due to known physiological condition 01/23/2015   Routine general medical examination at a health care facility 01/23/2015   Adjustment reaction 01/23/2015    Essential (primary) hypertension 01/23/2015   Proteinuria 06/01/2012    No Known Allergies  Past Surgical History:  Procedure Laterality Date   COLONOSCOPY  2005, 01/2015   Dr Mechele Collin- cleared for 3 yrs   COLONOSCOPY WITH PROPOFOL N/A 10/02/2023   Procedure: COLONOSCOPY WITH PROPOFOL;  Surgeon: Jaynie Collins, DO;  [facility];  Service: Gastroenterology;  Laterality: N/A;   TONSILLECTOMY     UPPER GASTROINTESTINAL ENDOSCOPY  01/2015   Dr Mechele Collin- erosions on esophagus    Social History   Tobacco Use   Smoking status: Former    Current packs/day: 0.00    Types: Cigarettes    Quit date: 09/13/1971    Years since quitting: 52.2   Smokeless tobacco: Never  Vaping Use   Vaping status: Never Used  Substance Use Topics   Alcohol use: Yes    Alcohol/week: 0.0 standard drinks of alcohol    Comment: socially   Drug use: No     Medication list has been reviewed and updated.  Current Meds  Medication Sig   Ascorbic Acid (VITAMIN C) 500 MG CAPS Take 1 capsule by mouth 2 (two) times daily.    aspirin (ASPIRIN LOW DOSE) 81 MG EC tablet Take 1 tablet (81 mg total) by mouth daily. Swallow whole.   Cholecalciferol (VITAMIN D3) 2000 UNITS capsule Take 1 capsule by mouth daily.   Coenzyme Q10 (COQ-10) 100 MG CAPS Take 1 capsule by mouth daily.   dutasteride (AVODART) 0.5 MG capsule Take 1 capsule (0.5 mg total) by mouth daily.   hydrochlorothiazide (HYDRODIURIL) 25 MG tablet Take 1 tablet (25 mg total) by mouth daily.   Multiple Vitamins-Minerals (CENTRUM SILVER ADULT 50+ PO) Take 1 tablet by mouth daily.   NIFEdipine (PROCARDIA-XL/NIFEDICAL-XL) 30 MG 24 hr tablet Take 1 tablet (30 mg total) by mouth daily.   Omega-3 Fatty Acids (FISH OIL) 1000 MG CAPS Take 1 capsule (1,000 mg total) by mouth 2 (two) times daily.   simvastatin (ZOCOR) 20 MG tablet Take 1 tablet (20 mg total) by mouth daily.   tadalafil (CIALIS) 20 MG tablet 0.5-1 tab 1 hour prior to intercourse   Zinc  50 MG TABS Take 50 mg by mouth daily.       11/17/2023   10:07 AM 06/09/2023    9:39 AM 10/12/2022   11:15 AM 08/29/2022    1:41 PM  GAD 7 : Generalized Anxiety Score  Nervous, Anxious, on Edge 0 0 0 0  Control/stop worrying 0 0 0 0  Worry too much - different things 0 0 0 0  Trouble relaxing 0 0 0 0  Restless 0 0 0 0  Easily annoyed or irritable 0 0 0 0  Afraid - awful might happen 0 0 0 0  Total GAD 7 Score 0 0 0 0  Anxiety Difficulty Not difficult at all Not difficult at all Not difficult at all Not difficult at all       11/17/2023   10:06 AM 06/09/2023    9:38 AM 02/15/2023   10:53 AM  Depression screen PHQ 2/9  Decreased Interest 0 0 0  Down, Depressed, Hopeless 0 0 0  PHQ - 2 Score 0 0 0  Altered sleeping 3 0 0  Tired, decreased energy 1 0 0  Change in appetite 0 0 0  Feeling bad or failure about yourself  0 0 0  Trouble concentrating 0 0 0  Moving slowly or fidgety/restless 0 0 0  Suicidal thoughts 0 0 0  PHQ-9 Score 4 0 0  Difficult doing work/chores Somewhat difficult Not difficult at all Not difficult at all    BP Readings from Last 3 Encounters:  11/17/23 138/70  10/02/23 132/70  09/22/23 139/81    Physical Exam Vitals and nursing note reviewed.  Constitutional:      Appearance: He is well-developed.  HENT:     Head: Normocephalic and atraumatic.     Right Ear: Tympanic membrane, ear canal and external ear normal.     Left Ear: Tympanic membrane, ear canal and external ear normal.     Nose: Nose normal.     Mouth/Throat:     Mouth: Mucous membranes are moist.     Dentition: Normal dentition.  Eyes:     General: Lids are normal. No scleral icterus.    Conjunctiva/sclera: Conjunctivae normal.     Pupils: Pupils are equal, round, and reactive to light.  Neck:     Thyroid: No thyromegaly.     Vascular: No carotid bruit, hepatojugular reflux or JVD.     Trachea: No tracheal deviation.  Cardiovascular:     Rate and Rhythm: Normal rate and regular  rhythm.     Pulses: Normal pulses.     Heart sounds: Normal heart sounds. No murmur heard.    No gallop.  Pulmonary:     Effort: Pulmonary effort is normal.     Breath sounds: Normal breath sounds. No wheezing, rhonchi or rales.  Abdominal:     General: Bowel sounds are normal.     Palpations: Abdomen is soft. There is no hepatomegaly, splenomegaly or mass.     Tenderness: There is no abdominal tenderness.     Hernia: There is no hernia in the left inguinal area.  Musculoskeletal:        General: Normal range of motion.     Cervical back: Normal range of motion and neck supple.  Lymphadenopathy:     Cervical: No cervical adenopathy.  Skin:    General: Skin is warm and dry.     Findings: No rash.  Neurological:     Mental Status: He is alert and oriented to person, place, and time.     Sensory: No sensory deficit.     Deep Tendon Reflexes: Reflexes are normal and symmetric.  Psychiatric:        Mood and Affect: Mood is not anxious or depressed.     Wt Readings from Last 3 Encounters:  11/17/23 177 lb 6.4 oz (80.5 kg)  10/02/23 176 lb (79.8 kg)  09/22/23 172 lb (78 kg)    BP 138/70   Pulse 74   Ht 5\' 8"  (1.727 m)   Wt 177 lb 6.4 oz (80.5 kg)   SpO2 100%   BMI 26.97 kg/m   Assessment and Plan:  1. Essential (primary) hypertension Chronic.  Controlled.  Asymptomatic.  Tolerating medication well blood pressure today is 138/70.  Tolerating current increased dosing of hydrochlorothiazide and we will recheck his electrolytes and GFR in roughly 4 weeks when he is a full 6 months out from his previous lab work.  There is meantime we will continue hydrochlorothiazide 25 mg daily and nifedipine XL 30 mg once a day. - hydrochlorothiazide (HYDRODIURIL) 25 MG tablet;  Take 1 tablet (25 mg total) by mouth daily.  Dispense: 90 tablet; Refill: 1 - NIFEdipine (PROCARDIA-XL/NIFEDICAL-XL) 30 MG 24 hr tablet; Take 1 tablet (30 mg total) by mouth daily.  Dispense: 90 tablet; Refill: 1  2.  Familial multiple lipoprotein-type hyperlipidemia Chronic.  Controlled.  Stable.  In addition to low-cholesterol low triglyceride guidelines for a dietary control we will continue simvastatin 20 mg once a day.  Will check CMP for hepatic disease.  For reasons check.  In 6 months - simvastatin (ZOCOR) 20 MG tablet; Take 1 tablet (20 mg total) by mouth daily.  Dispense: 90 tablet; Refill: 1    Elizabeth Sauer, MD

## 2023-11-17 NOTE — Patient Instructions (Signed)

## 2023-12-09 ENCOUNTER — Other Ambulatory Visit: Payer: Self-pay | Admitting: Family Medicine

## 2023-12-09 DIAGNOSIS — I10 Essential (primary) hypertension: Secondary | ICD-10-CM

## 2024-02-21 ENCOUNTER — Ambulatory Visit (INDEPENDENT_AMBULATORY_CARE_PROVIDER_SITE_OTHER): Payer: Medicare Other | Admitting: Emergency Medicine

## 2024-02-21 VITALS — Ht 68.0 in | Wt 172.0 lb

## 2024-02-21 DIAGNOSIS — Z0000 Encounter for general adult medical examination without abnormal findings: Secondary | ICD-10-CM | POA: Diagnosis not present

## 2024-02-21 NOTE — Patient Instructions (Signed)
 Alfred Edwards , Thank you for taking time out of your busy schedule to complete your Annual Wellness Visit with me. I enjoyed our conversation and look forward to speaking with you again next year. I, as well as your care team,  appreciate your ongoing commitment to your health goals. Please review the following plan we discussed and let me know if I can assist you in the future. Your Game plan/ To Do List    Referrals: None   Follow up Visits: Next Medicare AWV with our clinical staff: 02/26/25 @ 10:10am (PHONE VISIT)   Have you seen your provider in the last 6 months (3 months if uncontrolled diabetes)? Yes Next Office Visit with your provider: 05/08/24 @ 10:20 with Laroy Plunk, PA  Clinician Recommendations: Get a tetanus shot at your local pharmacy at your convenience. Aim for 30 minutes of exercise or brisk walking, 6-8 glasses of water, and 5 servings of fruits and vegetables each day.       This is a list of the screening recommended for you and due dates:  Health Maintenance  Topic Date Due   Flu Shot  04/12/2024   Medicare Annual Wellness Visit  02/20/2025   Colon Cancer Screening  10/01/2028   Pneumonia Vaccine  Completed   Hepatitis C Screening  Completed   Zoster (Shingles) Vaccine  Completed   HPV Vaccine  Aged Out   Meningitis B Vaccine  Aged Out   DTaP/Tdap/Td vaccine  Discontinued   COVID-19 Vaccine  Discontinued    Advanced directives: (In Chart) A copy of your advanced directives are scanned into your chart should your provider ever need it. Advance Care Planning is important because it:  [x]  Makes sure you receive the medical care that is consistent with your values, goals, and preferences  [x]  It provides guidance to your family and loved ones and reduces their decisional burden about whether or not they are making the right decisions based on your wishes.  Follow the link provided in your after visit summary or read over the paperwork we have mailed to you to  help you started getting your Advance Directives in place. If you need assistance in completing these, please reach out to us  so that we can help you!  See attachments for Preventive Care and Fall Prevention Tips.   Fall Prevention in the Home, Adult Falls can cause injuries and affect people of all ages. There are many simple things that you can do to make your home safe and to help prevent falls. If you need it, ask for help making these changes. What actions can I take to prevent falls? General information Use good lighting in all rooms. Make sure to: Replace any light bulbs that burn out. Turn on lights if it is dark and use night-lights. Keep items that you use often in easy-to-reach places. Lower the shelves around your home if needed. Move furniture so that there are clear paths around it. Do not keep throw rugs or other things on the floor that can make you trip. If any of your floors are uneven, fix them. Add color or contrast paint or tape to clearly mark and help you see: Grab bars or handrails. First and last steps of staircases. Where the edge of each step is. If you use a ladder or stepladder: Make sure that it is fully opened. Do not climb a closed ladder. Make sure the sides of the ladder are locked in place. Have someone hold the ladder while  you use it. Know where your pets are as you move through your home. What can I do in the bathroom?     Keep the floor dry. Clean up any water that is on the floor right away. Remove soap buildup in the bathtub or shower. Buildup makes bathtubs and showers slippery. Use non-skid mats or decals on the floor of the bathtub or shower. Attach bath mats securely with double-sided, non-slip rug tape. If you need to sit down while you are in the shower, use a non-slip stool. Install grab bars by the toilet and in the bathtub and shower. Do not use towel bars as grab bars. What can I do in the bedroom? Make sure that you have a light  by your bed that is easy to reach. Do not use any sheets or blankets on your bed that hang to the floor. Have a firm bench or chair with side arms that you can use for support when you get dressed. What can I do in the kitchen? Clean up any spills right away. If you need to reach something above you, use a sturdy step stool that has a grab bar. Keep electrical cables out of the way. Do not use floor polish or wax that makes floors slippery. What can I do with my stairs? Do not leave anything on the stairs. Make sure that you have a light switch at the top and the bottom of the stairs. Have them installed if you do not have them. Make sure that there are handrails on both sides of the stairs. Fix handrails that are broken or loose. Make sure that handrails are as long as the staircases. Install non-slip stair treads on all stairs in your home if they do not have carpet. Avoid having throw rugs at the top or bottom of stairs, or secure the rugs with carpet tape to prevent them from moving. Choose a carpet design that does not hide the edge of steps on the stairs. Make sure that carpet is firmly attached to the stairs. Fix any carpet that is loose or worn. What can I do on the outside of my home? Use bright outdoor lighting. Repair the edges of walkways and driveways and fix any cracks. Clear paths of anything that can make you trip, such as tools or rocks. Add color or contrast paint or tape to clearly mark and help you see high doorway thresholds. Trim any bushes or trees on the main path into your home. Check that handrails are securely fastened and in good repair. Both sides of all steps should have handrails. Install guardrails along the edges of any raised decks or porches. Have leaves, snow, and ice cleared regularly. Use sand, salt, or ice melt on walkways during winter months if you live where there is ice and snow. In the garage, clean up any spills right away, including grease or oil  spills. What other actions can I take? Review your medicines with your health care provider. Some medicines can make you confused or feel dizzy. This can increase your chance of falling. Wear closed-toe shoes that fit well and support your feet. Wear shoes that have rubber soles and low heels. Use a cane, walker, scooter, or crutches that help you move around if needed. Talk with your provider about other ways that you can decrease your risk of falls. This may include seeing a physical therapist to learn to do exercises to improve movement and strength. Where to find more information Centers for  Disease Control and Prevention, [employer]: TonerPromos.no [employer] on Aging: BaseRingTones.pl National Institute on Aging: BaseRingTones.pl Contact a health care provider if: You are afraid of falling at home. You feel weak, drowsy, or dizzy at home. You fall at home. Get help right away if you: Lose consciousness or have trouble moving after a fall. Have a fall that causes a head injury. These symptoms may be an emergency. Get help right away. Call 911. Do not wait to see if the symptoms will go away. Do not drive yourself to the hospital. This information is not intended to replace advice given to you by your health care provider. Make sure you discuss any questions you have with your health care provider. Document Revised: 05/02/2022 Document Reviewed: 05/02/2022 Elsevier Patient Education  2024 [employer].

## 2024-02-21 NOTE — Progress Notes (Signed)
 Subjective:   Alfred Edwards is a 78 y.o. who presents for a Medicare Wellness preventive visit.  As a reminder, Annual Wellness Visits don't include a physical exam, and some assessments may be limited, especially if this visit is performed virtually. We may recommend an in-person follow-up visit with your provider if needed.  Visit Complete: Virtual I connected with  [name] on 02/21/24 by a audio enabled telemedicine application and verified that I am speaking with the correct person using two identifiers.  Patient Location: Home  Provider Location: Home Office  I discussed the limitations of evaluation and management by telemedicine. The patient expressed understanding and agreed to proceed.  Vital Signs: Because this visit was a virtual/telehealth visit, some criteria may be missing or patient reported. Any vitals not documented were not able to be obtained and vitals that have been documented are patient reported.  VideoDeclined- This patient declined Librarian, academic. Therefore the visit was completed with audio only.  Persons Participating in Visit: Patient.  AWV Questionnaire: No: Patient Medicare AWV questionnaire was not completed prior to this visit.  Cardiac Risk Factors include: advanced age (>50men, >20 women);male gender;dyslipidemia;hypertension     Objective:     Today's Vitals   02/21/24 1006  Weight: 172 lb (78 kg)  Height: 5' 8 (1.727 m)   Body mass index is 26.15 kg/m.     02/21/2024   10:25 AM 10/02/2023    8:43 AM 02/15/2023   10:55 AM 10/18/2021    8:51 AM 10/14/2020    8:41 AM 11/28/2018    7:44 PM 10/10/2018    9:45 AM  Advanced Directives  Does Patient Have a Medical Advance Directive? Yes Yes Yes Yes Yes No Yes  Type of Estate agent of [place];Living will  Healthcare Power of [place];Living will Healthcare Power of [place];Living will Healthcare Power of [place];Living will   Living will;Healthcare Power of Attorney  Does patient want to make changes to medical advance directive? No - Patient declined  No - Patient declined      Copy of Healthcare Power of Attorney in Chart? Yes - validated most recent copy scanned in chart (See row information)  Yes - validated most recent copy scanned in chart (See row information) Yes - validated most recent copy scanned in chart (See row information) Yes - validated most recent copy scanned in chart (See row information)  Yes - validated most recent copy scanned in chart (See row information)  Would patient like information on creating a medical advance directive?      No - Patient declined     Current Medications (verified) Outpatient Encounter Medications as of 02/21/2024  Medication Sig   Ascorbic Acid (VITAMIN C) 500 MG CAPS Take 1 capsule by mouth 2 (two) times daily.    aspirin  (ASPIRIN  LOW DOSE) 81 MG EC tablet Take 1 tablet (81 mg total) by mouth daily. Swallow whole.   Cholecalciferol (VITAMIN D3) 2000 UNITS capsule Take 1 capsule by mouth daily.   Coenzyme Q10 (COQ-10) 100 MG CAPS Take 1 capsule by mouth daily.   dutasteride  (AVODART ) 0.5 MG capsule Take 1 capsule (0.5 mg total) by mouth daily.   hydrochlorothiazide  (HYDRODIURIL ) 25 MG tablet Take 1 tablet (25 mg total) by mouth daily.   Multiple Vitamins-Minerals (CENTRUM SILVER ADULT 50+ PO) Take 1 tablet by mouth daily.   NIFEdipine  (PROCARDIA -XL/NIFEDICAL-XL) 30 MG 24 hr tablet Take 1 tablet (30 mg total) by mouth daily.   Omega-3  Fatty Acids (FISH OIL ) 1000 MG CAPS Take 1 capsule (1,000 mg total) by mouth 2 (two) times daily.   omeprazole (PRILOSEC OTC) 20 MG tablet Take 20 mg by mouth daily as needed.   simvastatin  (ZOCOR ) 20 MG tablet Take 1 tablet (20 mg total) by mouth daily.   Zinc 50 MG TABS Take 50 mg by mouth daily.   hydrochlorothiazide  (MICROZIDE ) 12.5 MG capsule TAKE 1 CAPSULE BY MOUTH EVERY DAY (Patient not taking: Reported on 02/21/2024)   tadalafil   (CIALIS ) 20 MG tablet 0.5-1 tab 1 hour prior to intercourse (Patient not taking: Reported on 02/21/2024)   No facility-administered encounter medications on file as of 02/21/2024.    Allergies (verified) Patient has no known allergies.   History: Past Medical History:  Diagnosis Date   BPH (benign prostatic hypertrophy)    GERD (gastroesophageal reflux disease)    Hyperlipidemia    Hypertension    Stroke [facility])    TIA (transient ischemic attack) 11/2018   Past Surgical History:  Procedure Laterality Date   COLONOSCOPY  2005, 01/2015   Dr Felicita Horns- cleared for 3 yrs   COLONOSCOPY WITH PROPOFOL  N/A 10/02/2023   Procedure: COLONOSCOPY WITH PROPOFOL ;  Surgeon: Quintin Buckle, DO;  Location: [facility] ENDOSCOPY;  Service: Gastroenterology;  Laterality: N/A;   TONSILLECTOMY     UPPER GASTROINTESTINAL ENDOSCOPY  01/2015   Dr Felicita Horns- erosions on esophagus   Family History  Problem Relation Age of Onset   Congestive Heart Failure Mother    Heart murmur Mother    Heart disease Father    Coronary artery disease Father    Heart disease Brother    Social History   Socioeconomic History   Marital status: Significant Other    Spouse name: Not on file   Number of children: 0   Years of education: Not on file   Highest education level: Bachelor's degree (e.g., BA, AB, BS)  Occupational History   Occupation: Retired  Tobacco Use   Smoking status: Former    Current packs/day: 0.00    Average packs/day: 0.5 packs/day for 6.0 years (3.0 ttl pk-yrs)    Types: Cigarettes    Start date: 25    Quit date: 09/13/1971    Years since quitting: 52.4    Passive exposure: Past   Smokeless tobacco: Never  Vaping Use   Vaping status: Never Used  Substance and Sexual Activity   Alcohol use: Yes    Alcohol/week: 15.0 standard drinks of alcohol    Types: 15 Standard drinks or equivalent per week    Comment: 3 drinks 5 days a week   Drug use: No   Sexual activity: Yes  Other Topics Concern    Not on file  Social History Narrative   Wife passed in 2012 from cancer   Pt's significant other lives with him   Social Drivers of Corporate investment banker Strain: Low Risk  (02/21/2024)   Overall Financial Resource Strain (CARDIA)    Difficulty of Paying Living Expenses: Not hard at all  Food Insecurity: No Food Insecurity (02/21/2024)   Hunger Vital Sign    Worried About Running Out of Food in the Last Year: Never true    Ran Out of Food in the Last Year: Never true  Transportation Needs: No Transportation Needs (02/21/2024)   PRAPARE - Administrator, Civil Service (Medical): No    Lack of Transportation (Non-Medical): No  Physical Activity: Insufficiently Active (02/21/2024)   Exercise Vital Sign  Days of Exercise per Week: 3 days    Minutes of Exercise per Session: 40 min  Stress: No Stress Concern Present (02/21/2024)   [employer] of Occupational Health - Occupational Stress Questionnaire    Feeling of Stress : Not at all  Social Connections: Moderately Integrated (02/21/2024)   Social Connection and Isolation Panel [NHANES]    Frequency of Communication with Friends and Family: More than three times a week    Frequency of Social Gatherings with Friends and Family: More than three times a week    Attends Religious Services: More than 4 times per year    Active Member of [employer] or Organizations: No    Attends Engineer, structural: Never    Marital Status: Living with partner    Tobacco Counseling Counseling given: No    Clinical Intake:  Pre-visit preparation completed: Yes  Pain : No/denies pain     BMI - recorded: 26.15 Nutritional Status: BMI of 19-24  Normal Nutritional Risks: None Diabetes: No  Lab Results  Component Value Date   HGBA1C 5.5 11/28/2018     How often do you need to have someone help you when you read instructions, pamphlets, or other written materials from your doctor or pharmacy?: 1 - Never  Interpreter  Needed?: No  Information entered by :: Jaunita Messier, CMA   Activities of Daily Living     02/21/2024   10:08 AM  In your present state of health, do you have any difficulty performing the following activities:  Hearing? 1  Comment a little bit  Vision? 0  Difficulty concentrating or making decisions? 0  Walking or climbing stairs? 0  Dressing or bathing? 0  Doing errands, shopping? 0  Preparing Food and eating ? N  Using the Toilet? N  In the past six months, have you accidently leaked urine? Y  Comment once or twice  Do you have problems with loss of bowel control? N  Managing your Medications? N  Managing your Finances? N  Housekeeping or managing your Housekeeping? N    Patient Care Team: Leopoldo Rancher, PA as PCP - General (Physician Assistant) Geraline Knapp, MD as Consulting Physician (Urology) Mariane Shire, MD as Consulting Physician (Dermatology) Ree Candy (Optometry) Quintin Buckle, DO as Consulting Physician (Gastroenterology)  I have updated your Care Teams any recent Medical Services you may have received from other providers in the past year.     Assessment:    This is a routine wellness examination for Myc[place].  Hearing/Vision screen Hearing Screening - Comments:: &quot;A little hearing loss&quot; Vision Screening - Comments:: Gets routine eye exams, Dr. Austin Leff, [facility]   Goals Addressed             This Visit's Progress    Patient Stated       Lose 8-10lbs       Depression Screen     02/21/2024   10:22 AM 11/17/2023   10:06 AM 06/09/2023    9:38 AM 02/15/2023   10:53 AM 10/12/2022   11:15 AM 08/29/2022    1:41 PM 04/11/2022    1:22 PM  PHQ 2/9 Scores  PHQ - 2 Score 1 0 0 0 1 0 0  PHQ- 9 Score 4 4 0 0 1 0 0    Fall Risk     02/21/2024   10:29 AM 11/17/2023   10:06 AM 06/09/2023    9:38 AM 02/15/2023   10:56 AM 10/12/2022   11:14 AM  Fall Risk   Falls in the past year? 0 0 0 0 0  Number falls in past yr: 0 0  0 0 0  Injury with Fall? 0 0 0 0 0  Risk for fall due to : No Fall Risks No Fall Risks No Fall Risks History of fall(s) No Fall Risks  Follow up Falls evaluation completed Falls evaluation completed Falls evaluation completed Falls prevention discussed;Falls evaluation completed Falls evaluation completed    MEDICARE RISK AT HOME:  Medicare Risk at Home Any stairs in or around the home?: No If so, are there any without handrails?: No Home free of loose throw rugs in walkways, pet beds, electrical cords, etc?: Yes Adequate lighting in your home to reduce risk of falls?: Yes Life alert?: No Use of a cane, walker or w/c?: No Grab bars in the bathroom?: Yes Shower chair or bench in shower?: Yes Elevated toilet seat or a handicapped toilet?: Yes  TIMED UP AND GO:  Was the test performed?  No  Cognitive Function: 6CIT completed        02/21/2024   10:30 AM 02/15/2023   11:04 AM 10/14/2019    9:00 AM 10/10/2018    9:54 AM 07/24/2017    9:37 AM  6CIT Screen  What Year? 0 points 0 points 0 points 0 points 0 points  What month? 0 points 0 points 0 points 0 points 0 points  What time? 0 points 0 points 0 points 0 points 0 points  Count back from 20 0 points 0 points 0 points 0 points 0 points  Months in reverse 0 points 0 points 0 points 0 points 0 points  Repeat phrase 0 points 0 points 0 points 0 points 0 points  Total Score 0 points 0 points 0 points 0 points 0 points    Immunizations Immunization History  Administered Date(s) Administered   Fluad Quad(high Dose 65+) 08/24/2020, 05/25/2021, 06/22/2022   Fluad Trivalent(High Dose 65+) 06/09/2023   Influenza, High Dose Seasonal PF 06/22/2017, 07/24/2018, 05/15/2019   Influenza,inj,Quad PF,6+ Mos 07/01/2013, 06/27/2014, 06/29/2015, 08/02/2016   Influenza-Unspecified 06/27/2014   PFIZER(Purple Top)SARS-COV-2 Vaccination 11/14/2019, 12/05/2019   Pneumococcal Conjugate-13 07/24/2017   Pneumococcal Polysaccharide-23 06/23/2008,  08/22/2018   Tdap 12/06/2012   Zoster Recombinant(Shingrix ) 10/12/2022, 01/20/2023    Screening Tests Health Maintenance  Topic Date Due   INFLUENZA VACCINE  04/12/2024   Medicare Annual Wellness (AWV)  02/20/2025   Colonoscopy  10/01/2028   Pneumonia Vaccine 6+ Years old  Completed   Hepatitis C Screening  Completed   Zoster Vaccines- Shingrix   Completed   HPV VACCINES  Aged Out   Meningococcal B Vaccine  Aged Out   DTaP/Tdap/Td  Discontinued   COVID-19 Vaccine  Discontinued    Health Maintenance  There are no preventive care reminders to display for this patient.  Health Maintenance Items Addressed: See Nurse Notes at the end of this note  Additional Screening:  Vision Screening: Recommended annual ophthalmology exams for early detection of glaucoma and other disorders of the eye. Would you like a referral to an eye doctor? No    Dental Screening: Recommended annual dental exams for proper oral hygiene  Community Resource Referral / Chronic Care Management: CRR required this visit?  No   CCM required this visit?  No   Plan:    I have personally reviewed and noted the following in the patient's chart:   Medical and social history Use of alcohol, tobacco or illicit drugs  Current medications and  supplements including opioid prescriptions. Patient is not currently taking opioid prescriptions. Functional ability and status Nutritional status Physical activity Advanced directives List of other physicians Hospitalizations, surgeries, and ER visits in previous 12 months Vitals Screenings to include cognitive, depression, and falls Referrals and appointments  In addition, I have reviewed and discussed with patient certain preventive protocols, quality metrics, and best practice recommendations. A written personalized care plan for preventive services as well as general preventive health recommendations were provided to patient.   Jaunita Messier, CMA   02/21/2024    After Visit Summary: (MyChart) Due to this being a telephonic visit, the after visit summary with patients personalized plan was offered to patient via MyChart   Notes:  Needs Tdap (pharmacy) or will pay for at the office. Declined Covid vaccine

## 2024-03-02 IMAGING — CR DG SHOULDER 2+V*R*
3 series · 3 of 3 positions shown · non-contrast
Comparison: None.

CLINICAL DATA: Fall today. Skin tears over the elbow with right
shoulder pain.

EXAM:
RIGHT SHOULDER - 2+ VIEW

[shoulder grashey]
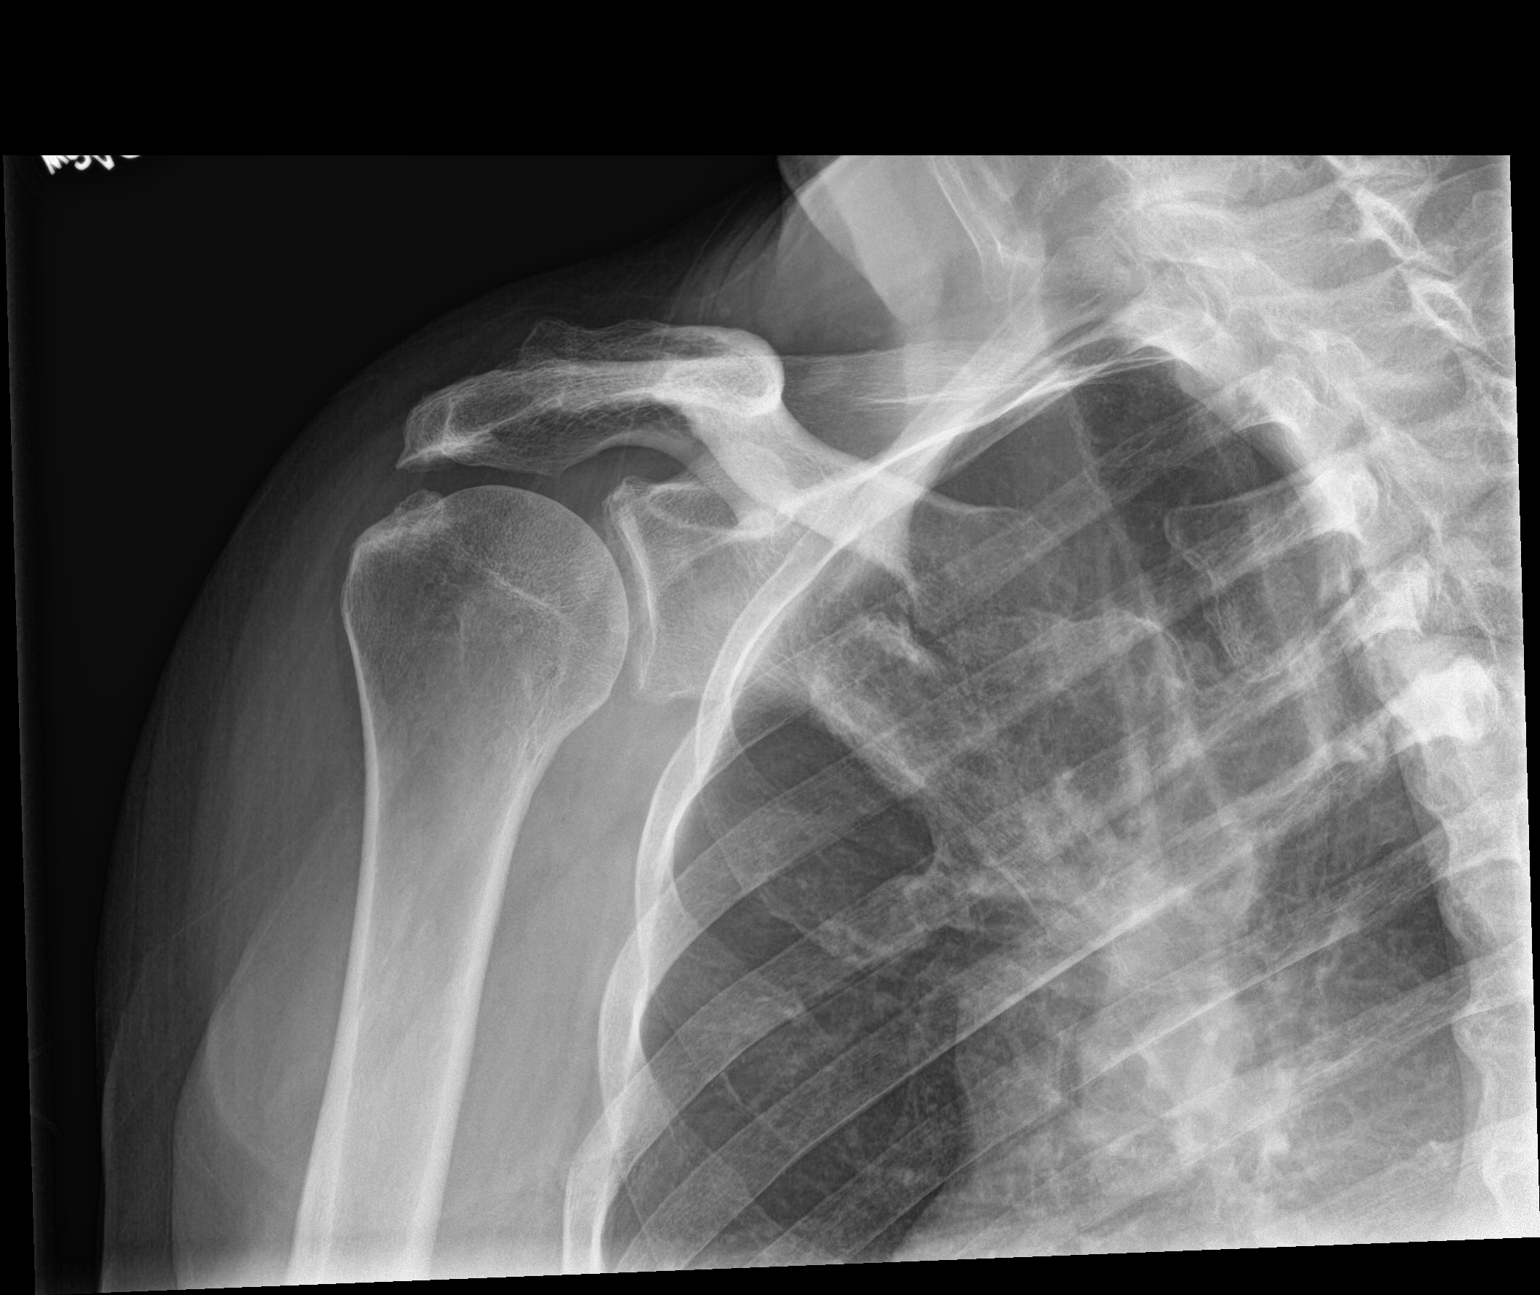

[shoulder y view]
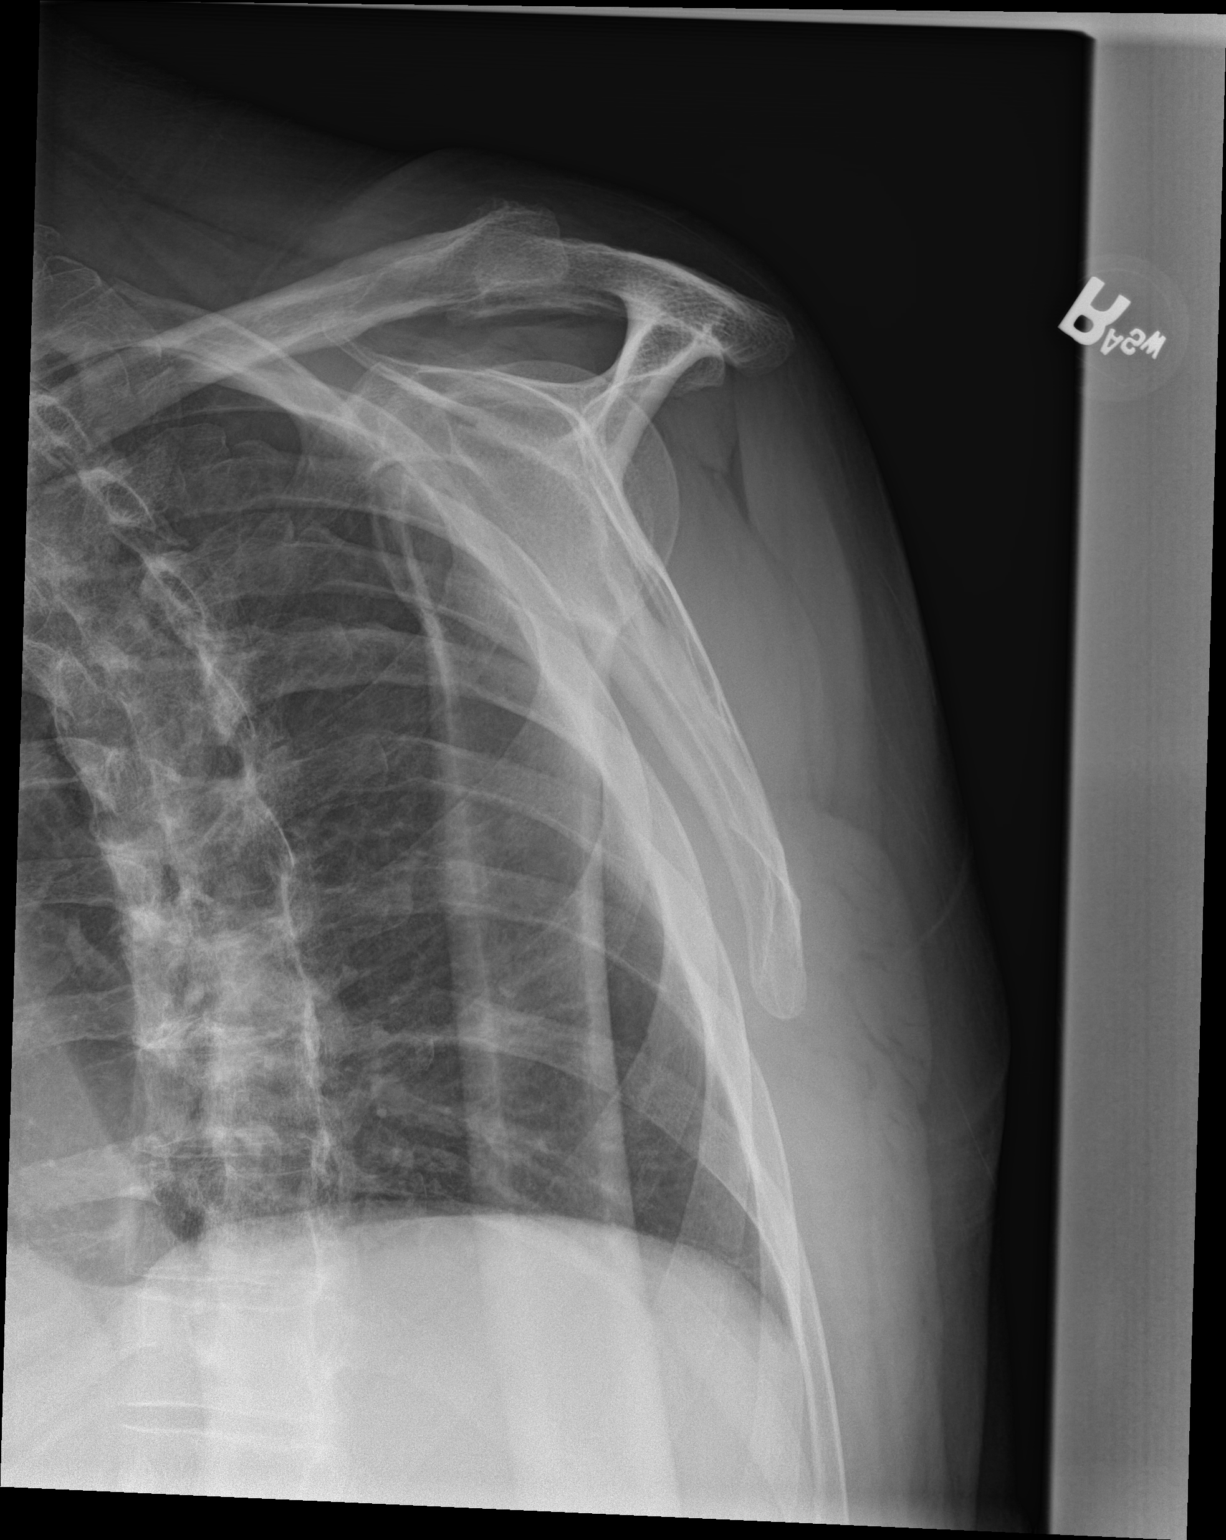

[shoulder axial]
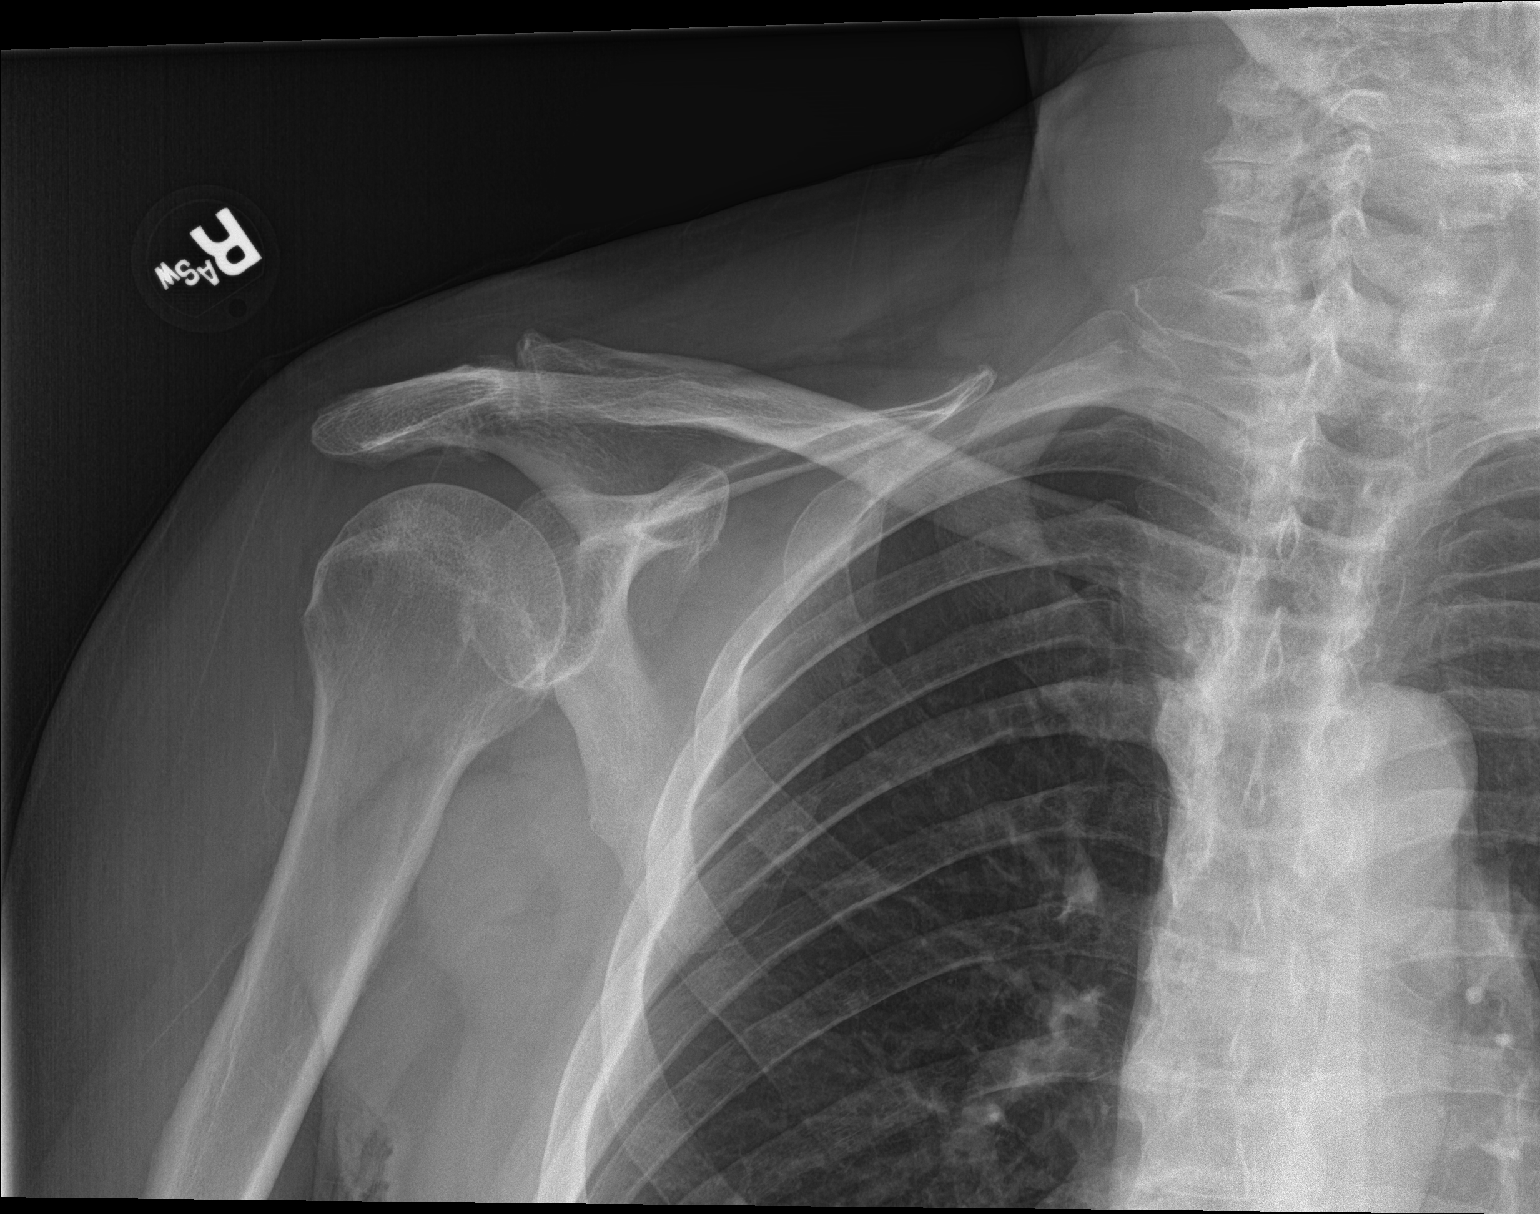

[3 of 3 positions shown; findings below may reference images not displayed]

FINDINGS: The mineralization and alignment are normal. There is no evidence of
acute fracture or dislocation. Mild acromioclavicular and
glenohumeral degenerative changes. The subacromial space is
preserved.
IMPRESSION: No evidence of acute fracture or dislocation. Mild degenerative
changes.

## 2024-03-02 IMAGING — CR DG ELBOW COMPLETE 3+V*R*
6 series · 6 of 6 positions shown · non-contrast
Comparison: None.

CLINICAL DATA: Fall, skin tear of elbow

EXAM:
RIGHT ELBOW - COMPLETE 3+ VIEW

[elbow ap]
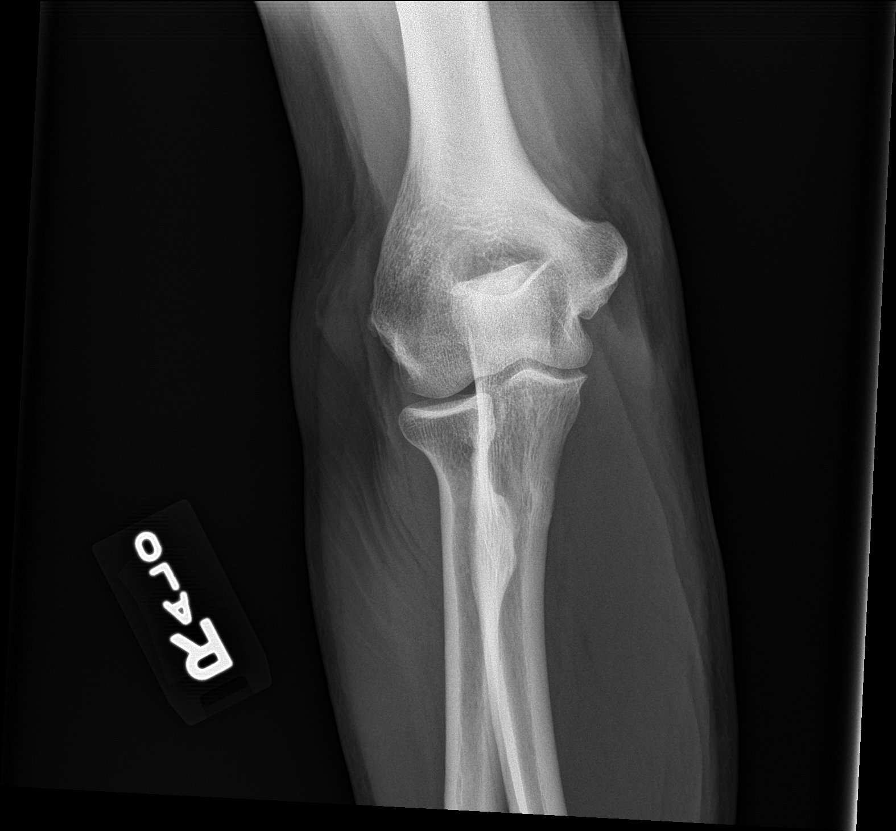

[elbow obl (1 of 4)]
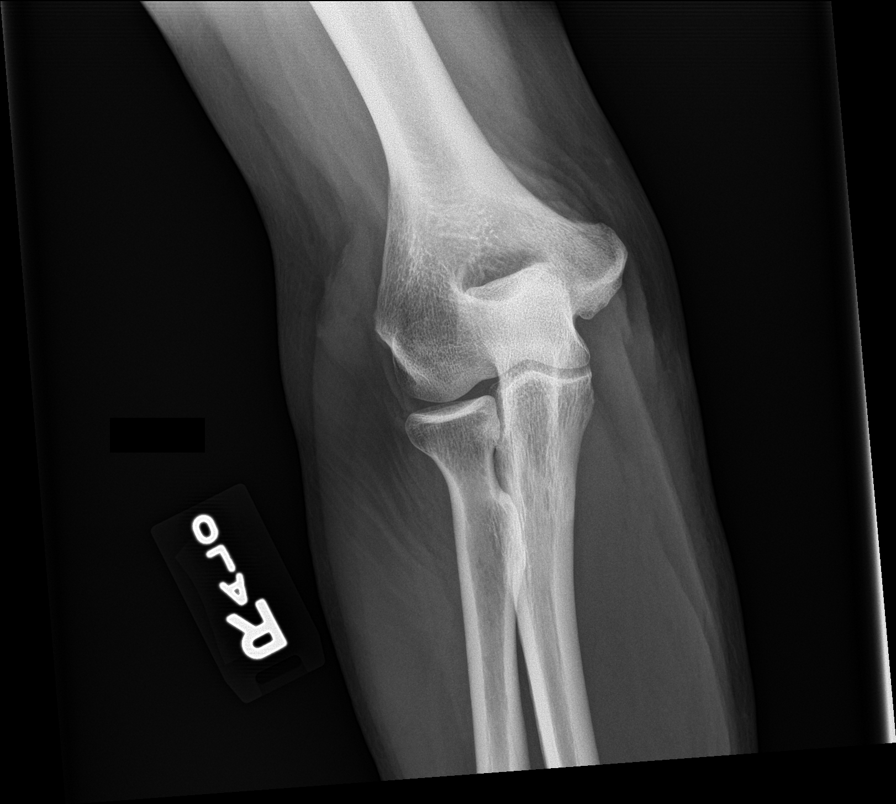

[elbow obl (2 of 4)]
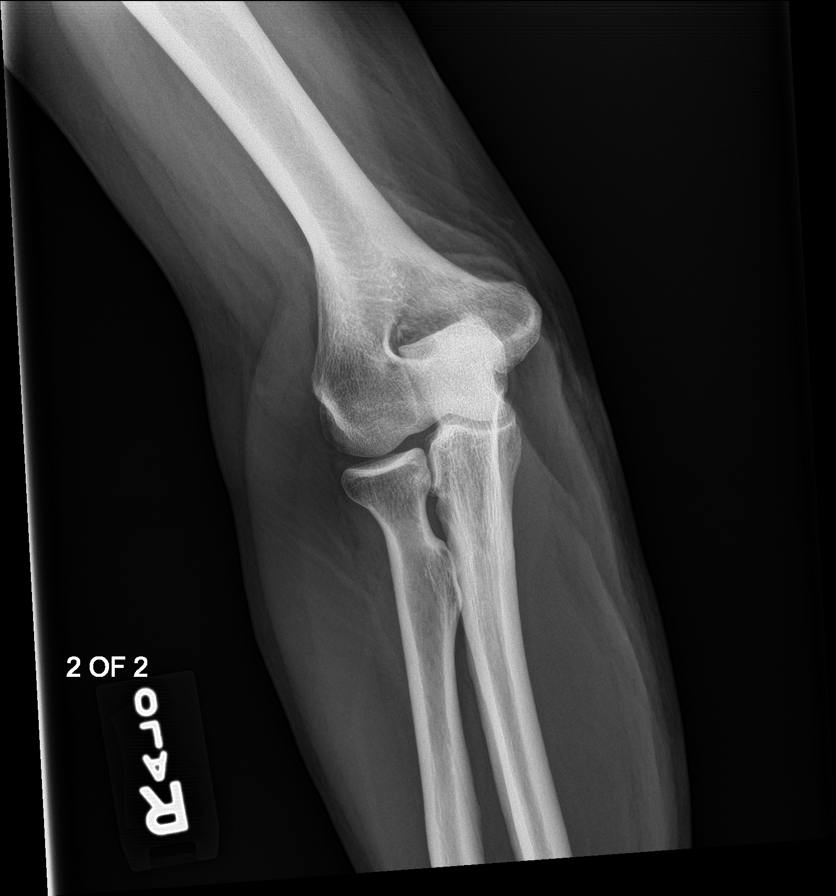

[elbow lat]
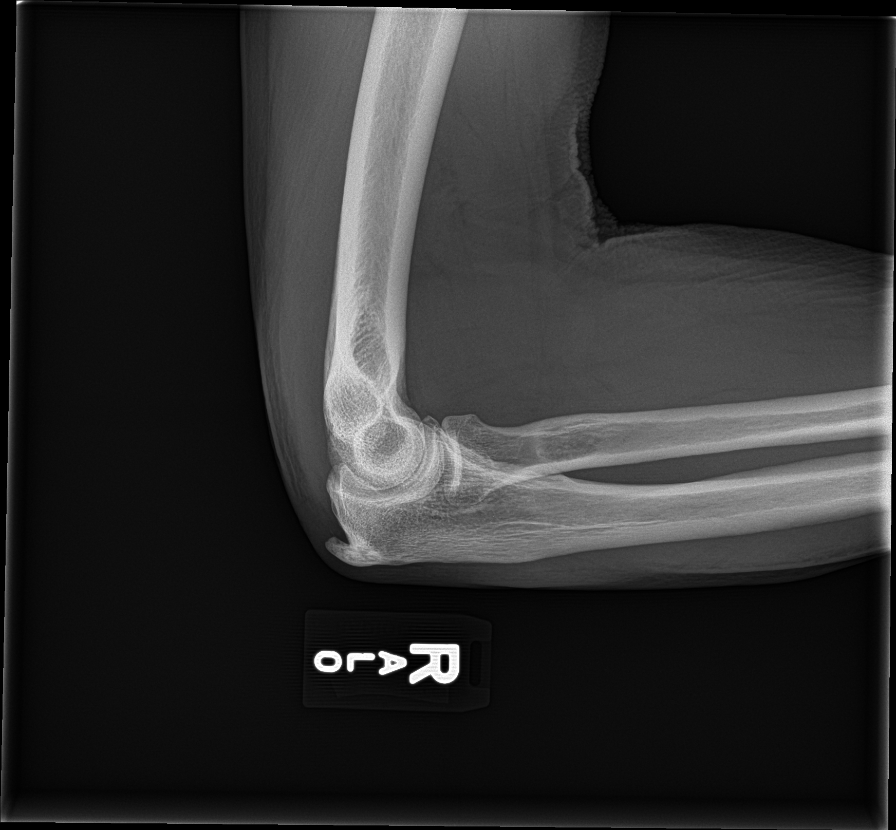

[elbow obl (3 of 4)]
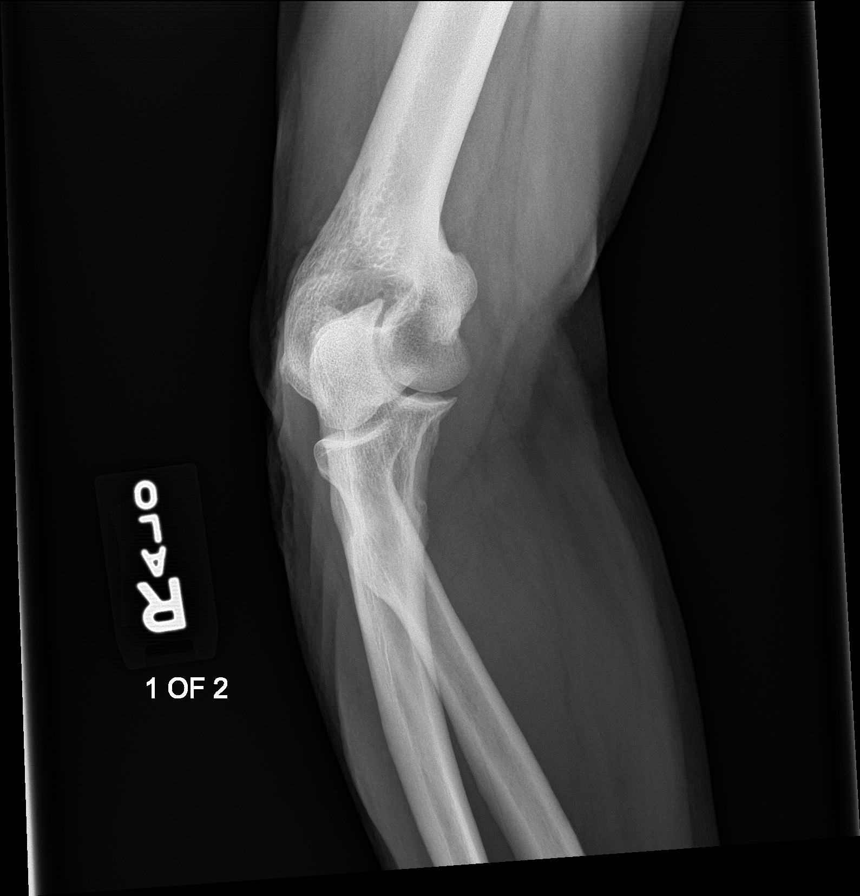

[elbow obl (4 of 4)]
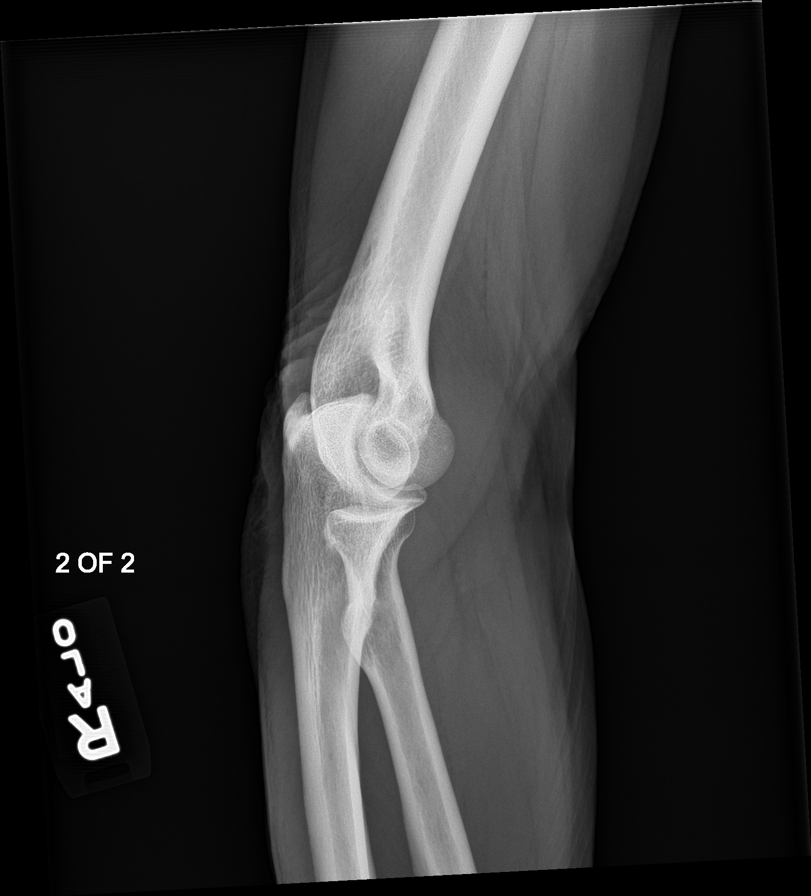

[6 of 6 positions shown; findings below may reference images not displayed]

FINDINGS: There is no evidence of fracture, dislocation, or joint effusion.
There is no evidence of arthropathy or other focal bone abnormality.
Soft tissues are unremarkable.
IMPRESSION: No fracture or dislocation of the right elbow. No elbow joint
effusion to suggest radiographically occult fracture.

## 2024-05-08 ENCOUNTER — Encounter: Admitting: Physician Assistant

## 2024-05-22 ENCOUNTER — Encounter: Payer: Self-pay | Admitting: Physician Assistant

## 2024-05-22 ENCOUNTER — Ambulatory Visit (INDEPENDENT_AMBULATORY_CARE_PROVIDER_SITE_OTHER): Admitting: Physician Assistant

## 2024-05-22 VITALS — BP 118/62 | HR 65 | Temp 99.0°F | Ht 68.0 in | Wt 174.0 lb

## 2024-05-22 DIAGNOSIS — I10 Essential (primary) hypertension: Secondary | ICD-10-CM

## 2024-05-22 DIAGNOSIS — E782 Mixed hyperlipidemia: Secondary | ICD-10-CM | POA: Diagnosis not present

## 2024-05-22 DIAGNOSIS — K640 First degree hemorrhoids: Secondary | ICD-10-CM | POA: Insufficient documentation

## 2024-05-22 DIAGNOSIS — K5790 Diverticulosis of intestine, part unspecified, without perforation or abscess without bleeding: Secondary | ICD-10-CM | POA: Insufficient documentation

## 2024-05-22 MED ORDER — HYDROCHLOROTHIAZIDE 25 MG PO TABS: 12.5000 mg | ORAL_TABLET | Freq: Every day | ORAL | Status: DC

## 2024-05-22 NOTE — Assessment & Plan Note (Signed)
 Well-controlled at present.  Reviewed with patient that if I had to suspect one of his antihypertensives to be contributing to fatigue and bradycardia, it would be nifedipine  and not HCTZ.  That said, I would be agreeable to a trial of HCTZ 12.5 mg for 1-2 weeks with careful home BP monitoring.  If blood pressure remains well-controlled, and his symptoms improved, may continue at this dose.  Otherwise he should keep taking 25 mg daily in addition to his nifedipine .  Check routine labs today

## 2024-05-22 NOTE — Progress Notes (Signed)
 Date:  05/22/2024   Name:  Alfred Edwards   DOB:  [birth year]   MRN:  [id number]   Chief Complaint: Transitions Of Care  HPI Alfred Edwards is a very pleasant 78 year old male with a history of HTN, BPH, HLD who presents new to me today as a transfer of care from my recently retired Animator Dr. Cathryne Molt.  He is interested in potentially decreasing his dose of HCTZ, which he suspects might be making him tired and contributing to a relatively low pulse.  He monitors blood pressure at home and brings a log for my review showing readings generally 115/55-135/65 mmHg.   He is due to recheck on some labs today.  Medication list has been reviewed and updated.  Current Meds  Medication Sig   Ascorbic Acid (VITAMIN C) 500 MG CAPS Take 1 capsule by mouth 2 (two) times daily.    aspirin  (ASPIRIN  LOW DOSE) 81 MG EC tablet Take 1 tablet (81 mg total) by mouth daily. Swallow whole.   Cholecalciferol (VITAMIN D3) 2000 UNITS capsule Take 1 capsule by mouth daily.   Coenzyme Q10 (COQ-10) 100 MG CAPS Take 1 capsule by mouth daily.   dutasteride  (AVODART ) 0.5 MG capsule Take 1 capsule (0.5 mg total) by mouth daily.   Multiple Vitamins-Minerals (CENTRUM SILVER ADULT 50+ PO) Take 1 tablet by mouth daily.   NIFEdipine  (PROCARDIA -XL/NIFEDICAL-XL) 30 MG 24 hr tablet Take 1 tablet (30 mg total) by mouth daily.   Omega-3 Fatty Acids (FISH OIL ) 1000 MG CAPS Take 1 capsule (1,000 mg total) by mouth 2 (two) times daily.   omeprazole (PRILOSEC OTC) 20 MG tablet Take 20 mg by mouth daily as needed.   simvastatin  (ZOCOR ) 20 MG tablet Take 1 tablet (20 mg total) by mouth daily.   Zinc 50 MG TABS Take 50 mg by mouth daily.   [DISCONTINUED] hydrochlorothiazide  (HYDRODIURIL ) 25 MG tablet Take 1 tablet (25 mg total) by mouth daily.   [DISCONTINUED] hydrochlorothiazide  (MICROZIDE ) 12.5 MG capsule TAKE 1 CAPSULE BY MOUTH EVERY DAY (Patient taking differently: Take 25 mg by mouth daily.)     Review of  Systems  Patient Active Problem List   Diagnosis Date Noted   Grade I internal hemorrhoids 05/22/2024   Diverticulosis 05/22/2024   Rotator cuff impingement syndrome of right shoulder 12/03/2021   History of CVA (cerebrovascular accident) 11/29/2018   Erectile dysfunction 08/28/2017   Venous insufficiency 02/02/2017   Hyperlipidemia 12/28/2015   Benign prostatic hyperplasia with lower urinary tract symptoms 12/28/2015   Essential (primary) hypertension 01/23/2015    No Known Allergies  Immunization History  Administered Date(s) Administered   Fluad Quad(high Dose 65+) 08/24/2020, 05/25/2021, 06/22/2022   Fluad Trivalent(High Dose 65+) 06/09/2023   INFLUENZA, HIGH DOSE SEASONAL PF 06/22/2017, 07/24/2018, 05/15/2019   Influenza,inj,Quad PF,6+ Mos 07/01/2013, 06/27/2014, 06/29/2015, 08/02/2016   Influenza-Unspecified 06/27/2014   PFIZER(Purple Top)SARS-COV-2 Vaccination 11/14/2019, 12/05/2019   Pneumococcal Conjugate-13 07/24/2017   Pneumococcal Polysaccharide-23 06/23/2008, 08/22/2018   Tdap 12/06/2012   Zoster Recombinant(Shingrix ) 10/12/2022, 01/20/2023    Past Surgical History:  Procedure Laterality Date   COLONOSCOPY  2005, 01/2015   Dr Viktoria- cleared for 3 yrs   COLONOSCOPY WITH PROPOFOL  N/A 10/02/2023   Procedure: COLONOSCOPY WITH PROPOFOL ;  Surgeon: Onita Elspeth Sharper, DO;  [facility] END[facility];  Service: Gastroenterology;  Laterality: N/A;   TONSILLECTOMY     UPPER GASTROINTESTINAL ENDOSCOPY  01/2015   Dr Viktoria- erosions on esophagus    Social History   Tobacco Use  Smoking status: Former    Current packs/day: 0.00    Average packs/day: 0.5 packs/day for 6.0 years (3.0 ttl pk-yrs)    Types: Cigarettes    Start date: 29    Quit date: 09/13/1971    Years since quitting: 52.7    Passive exposure: Past   Smokeless tobacco: Never  Vaping Use   Vaping status: Never Used  Substance Use Topics   Alcohol use: Yes    Alcohol/week: 15.0 standard drinks  of alcohol    Types: 15 Standard drinks or equivalent per week    Comment: 3 drinks 5 days a week   Drug use: No    Family History  Problem Relation Age of Onset   Congestive Heart Failure Mother    Heart murmur Mother    Heart disease Father    Coronary artery disease Father    Heart disease Brother         05/22/2024   10:14 AM 11/17/2023   10:07 AM 06/09/2023    9:39 AM 10/12/2022   11:15 AM  GAD 7 : Generalized Anxiety Score  Nervous, Anxious, on Edge 0 0 0 0  Control/stop worrying 0 0 0 0  Worry too much - different things 0 0 0 0  Trouble relaxing 0 0 0 0  Restless 0 0 0 0  Easily annoyed or irritable 0 0 0 0  Afraid - awful might happen 0 0 0 0  Total GAD 7 Score 0 0 0 0  Anxiety Difficulty Not difficult at all Not difficult at all Not difficult at all Not difficult at all       05/22/2024   10:14 AM 02/21/2024   10:22 AM 11/17/2023   10:06 AM  Depression screen PHQ 2/9  Decreased Interest 0 1 0  Down, Depressed, Hopeless 0 0 0  PHQ - 2 Score 0 1 0  Altered sleeping  3 3  Tired, decreased energy  0 1  Change in appetite  0 0  Feeling bad or failure about yourself   0 0  Trouble concentrating  0 0  Moving slowly or fidgety/restless  0 0  Suicidal thoughts  0 0  PHQ-9 Score  4 4  Difficult doing work/chores  Not difficult at all Somewhat difficult    BP Readings from Last 3 Encounters:  05/22/24 118/62  11/17/23 138/70  10/02/23 132/70    Wt Readings from Last 3 Encounters:  05/22/24 174 lb (78.9 kg)  02/21/24 172 lb (78 kg)  11/17/23 177 lb 6.4 oz (80.5 kg)    BP 118/62 (Cuff Size: Normal)   Pulse 65   Temp 99 F (37.2 C)   Ht 5' 8 (1.727 m)   Wt 174 lb (78.9 kg)   SpO2 96%   BMI 26.46 kg/m   Physical Exam Vitals and nursing note reviewed.  Constitutional:      Appearance: Normal appearance.  Neck:     Vascular: No carotid bruit.  Cardiovascular:     Rate and Rhythm: Normal rate and regular rhythm.     Heart sounds: No murmur heard.     No friction rub. No gallop.  Pulmonary:     Effort: Pulmonary effort is normal.     Breath sounds: Normal breath sounds.  Abdominal:     General: There is no distension.  Musculoskeletal:        General: Normal range of motion.  Skin:    General: Skin is warm and dry.  Neurological:  Mental Status: He is alert and oriented to person, place, and time.     Gait: Gait is intact.  Psychiatric:        Mood and Affect: Mood and affect normal.     Recent Labs     Component Value Date/Time   NA 142 06/09/2023 1035   K 4.9 06/09/2023 1035   CL 104 06/09/2023 1035   CO2 23 06/09/2023 1035   GLUCOSE 91 06/09/2023 1035   GLUCOSE 116 (H) 11/28/2018 1949   BUN 13 06/09/2023 1035   CREATININE 0.98 06/09/2023 1035   CALCIUM 9.3 06/09/2023 1035   PROT 6.7 06/09/2023 1035   ALBUMIN 4.0 06/09/2023 1035   AST 17 06/09/2023 1035   ALT 17 06/09/2023 1035   ALKPHOS 54 06/09/2023 1035   BILITOT 0.5 06/09/2023 1035   GFRNONAA 67 08/24/2020 1528   GFRAA 77 08/24/2020 1528    Lab Results  Component Value Date   WBC 7.3 11/28/2018   HGB 12.8 (L) 11/28/2018   HCT 39.7 11/28/2018   MCV 84.8 11/28/2018   PLT 264 11/28/2018   Lab Results  Component Value Date   HGBA1C 5.5 11/28/2018   Lab Results  Component Value Date   CHOL 161 06/09/2023   HDL 81 06/09/2023   LDLCALC 68 06/09/2023   TRIG 61 06/09/2023   CHOLHDL 2.1 08/21/2019   No results found for: TSH    Assessment and Plan:  Essential (primary) hypertension Assessment & Plan: Well-controlled at present.  Reviewed with patient that if I had to suspect one of his antihypertensives to be contributing to fatigue and bradycardia, it would be nifedipine  and not HCTZ.  That said, I would be agreeable to a trial of HCTZ 12.5 mg for 1-2 weeks with careful home BP monitoring.  If blood pressure remains well-controlled, and his symptoms improved, may continue at this dose.  Otherwise he should keep taking 25 mg daily in addition to  his nifedipine .  Check routine labs today  Orders: -     CBC with Differential/Platelet -     Comprehensive metabolic panel with GFR -     Lipid panel -     hydroCHLOROthiazide ; Take 0.5 tablets (12.5 mg total) by mouth daily.  Mixed hyperlipidemia Assessment & Plan: Check fasting lipids. I recommend limiting consumption of foods high in saturated fats. High-fiber foods such as fruits, vegetables, beans, whole grains, and nuts are encouraged. As always, regular physical activity is recommended for cholesterol metabolism.   Orders: -     Lipid panel     Return in about 6 months (around 11/19/2024) for OV f/u HTN.    Rolan Hoyle, PA-C, DMSc, Nutritionist [facility] [phone]

## 2024-05-22 NOTE — Assessment & Plan Note (Addendum)
 Check fasting lipids. I recommend limiting consumption of foods high in saturated fats. High-fiber foods such as fruits, vegetables, beans, whole grains, and nuts are encouraged. As always, regular physical activity is recommended for cholesterol metabolism.

## 2024-05-23 ENCOUNTER — Ambulatory Visit: Payer: Self-pay | Admitting: Physician Assistant

## 2024-05-23 LAB — COMPREHENSIVE METABOLIC PANEL WITH GFR
ALT: 14 IU/L (ref 0–44)
AST: 13 IU/L (ref 0–40)
Albumin: 4.3 g/dL (ref 3.8–4.8)
Alkaline Phosphatase: 57 IU/L (ref 44–121)
BUN/Creatinine Ratio: 14 (ref 10–24)
BUN: 18 mg/dL (ref 8–27)
Bilirubin Total: 0.9 mg/dL (ref 0.0–1.2)
CO2: 21 mmol/L (ref 20–29)
Calcium: 9.4 mg/dL (ref 8.6–10.2)
Chloride: 104 mmol/L (ref 96–106)
Creatinine, Ser: 1.29 mg/dL — ABNORMAL HIGH (ref 0.76–1.27)
Globulin, Total: 3 g/dL (ref 1.5–4.5)
Glucose: 90 mg/dL (ref 70–99)
Potassium: 4.5 mmol/L (ref 3.5–5.2)
Sodium: 144 mmol/L (ref 134–144)
Total Protein: 7.3 g/dL (ref 6.0–8.5)
eGFR: 57 mL/min/1.73 — ABNORMAL LOW (ref >59)

## 2024-05-23 LAB — CBC WITH DIFFERENTIAL/PLATELET
Basophils Absolute: 0.1 x10E3/uL (ref 0.0–0.2)
Basos: 1 %
EOS (ABSOLUTE): 0.1 x10E3/uL (ref 0.0–0.4)
Eos: 1 %
Hematocrit: 43.8 % (ref 37.5–51.0)
Hemoglobin: 14 g/dL (ref 13.0–17.7)
Immature Grans (Abs): 0 x10E3/uL (ref 0.0–0.1)
Immature Granulocytes: 0 %
Lymphocytes Absolute: 2 x10E3/uL (ref 0.7–3.1)
Lymphs: 28 %
MCH: 26.5 pg — ABNORMAL LOW (ref 26.6–33.0)
MCHC: 32 g/dL (ref 31.5–35.7)
MCV: 83 fL (ref 79–97)
Monocytes Absolute: 0.9 x10E3/uL (ref 0.1–0.9)
Monocytes: 12 %
Neutrophils Absolute: 4.2 x10E3/uL (ref 1.4–7.0)
Neutrophils: 58 %
Platelets: 282 x10E3/uL (ref 150–450)
RBC: 5.29 x10E6/uL (ref 4.14–5.80)
RDW: 16.8 % — ABNORMAL HIGH (ref 11.6–15.4)
WBC: 7.2 x10E3/uL (ref 3.4–10.8)

## 2024-05-23 LAB — LIPID PANEL
Chol/HDL Ratio: 2.4 ratio (ref 0.0–5.0)
Cholesterol, Total: 179 mg/dL (ref 100–199)
HDL: 75 mg/dL (ref >39)
LDL Chol Calc (NIH): 86 mg/dL (ref 0–99)
Triglycerides: 100 mg/dL (ref 0–149)
VLDL Cholesterol Cal: 18 mg/dL (ref 5–40)

## 2024-06-26 ENCOUNTER — Other Ambulatory Visit: Payer: Self-pay

## 2024-06-26 DIAGNOSIS — I10 Essential (primary) hypertension: Secondary | ICD-10-CM

## 2024-06-26 MED ORDER — HYDROCHLOROTHIAZIDE 12.5 MG PO TABS: 12.5000 mg | ORAL_TABLET | Freq: Every day | ORAL | 0 refills | Status: DC

## 2024-07-17 ENCOUNTER — Other Ambulatory Visit: Payer: Self-pay | Admitting: Urology

## 2024-07-24 ENCOUNTER — Other Ambulatory Visit: Payer: Self-pay

## 2024-07-24 DIAGNOSIS — I10 Essential (primary) hypertension: Secondary | ICD-10-CM

## 2024-07-24 DIAGNOSIS — E7849 Other hyperlipidemia: Secondary | ICD-10-CM

## 2024-07-24 MED ORDER — NIFEDIPINE ER OSMOTIC RELEASE 30 MG PO TB24: 30.0000 mg | ORAL_TABLET | Freq: Every day | ORAL | 1 refills | Status: AC

## 2024-07-24 MED ORDER — SIMVASTATIN 20 MG PO TABS: 20.0000 mg | ORAL_TABLET | Freq: Every day | ORAL | 1 refills | Status: AC

## 2024-09-19 ENCOUNTER — Other Ambulatory Visit: Payer: Self-pay

## 2024-09-19 DIAGNOSIS — R972 Elevated prostate specific antigen [PSA]: Secondary | ICD-10-CM

## 2024-09-20 ENCOUNTER — Other Ambulatory Visit: Payer: Self-pay

## 2024-09-20 ENCOUNTER — Other Ambulatory Visit: Payer: Self-pay | Admitting: Physician Assistant

## 2024-09-20 DIAGNOSIS — I10 Essential (primary) hypertension: Secondary | ICD-10-CM

## 2024-09-20 NOTE — Telephone Encounter (Signed)
 Requested Prescriptions  Pending Prescriptions Disp Refills   hydrochlorothiazide  (HYDRODIURIL ) 12.5 MG tablet [Pharmacy Med Name: HYDROCHLOROTHIAZIDE  12.5 MG TB] 90 tablet 0    Sig: TAKE 1 TABLET BY MOUTH EVERY DAY     Cardiovascular: Diuretics - Thiazide Failed - 09/20/2024  2:20 PM      Failed - Cr in normal range and within 180 days    Creatinine, Ser  Date Value Ref Range Status  05/22/2024 1.29 (H) 0.76 - 1.27 mg/dL Final         Passed - K in normal range and within 180 days    Potassium  Date Value Ref Range Status  05/22/2024 4.5 3.5 - 5.2 mmol/L Final         Passed - Na in normal range and within 180 days    Sodium  Date Value Ref Range Status  05/22/2024 144 134 - 144 mmol/L Final         Passed - Last BP in normal range    BP Readings from Last 1 Encounters:  05/22/24 118/62         Passed - Valid encounter within last 6 months    Recent Outpatient Visits           4 months ago Essential (primary) hypertension   [facility], Toribio SQUIBB, PA   10 months ago Essential (primary) hypertension    [facility] Joshua Cathryne JAYSON, MD       Future Appointments             In 5 days Stoioff, Glendia JAYSON, MD [facility] [place]

## 2024-09-24 ENCOUNTER — Other Ambulatory Visit

## 2024-09-24 DIAGNOSIS — R972 Elevated prostate specific antigen [PSA]: Secondary | ICD-10-CM

## 2024-09-25 ENCOUNTER — Ambulatory Visit: Payer: Self-pay | Admitting: Urology

## 2024-09-25 ENCOUNTER — Encounter: Payer: Self-pay | Admitting: Urology

## 2024-09-25 VITALS — BP 163/71 | HR 76 | Ht 68.0 in | Wt 172.0 lb

## 2024-09-25 DIAGNOSIS — N401 Enlarged prostate with lower urinary tract symptoms: Secondary | ICD-10-CM | POA: Diagnosis not present

## 2024-09-25 DIAGNOSIS — R972 Elevated prostate specific antigen [PSA]: Secondary | ICD-10-CM

## 2024-09-25 LAB — PSA: Prostate Specific Ag, Serum: 2.5 ng/mL (ref 0.0–4.0)

## 2024-09-25 NOTE — Progress Notes (Signed)
 "  09/25/2024 9:43 AM   Alfred Edwards 1946-07-08 [id number]  Referring provider: Joshua Cathryne BROCKS, MD No address on file  Chief Complaint  Patient presents with   Elevated PSA   Urologic history: 1.  Elevated PSA Prostate biopsy 2008; PSA 6.2; 52 g gland; benign pathology   2.  BPH with lower urinary tract symptoms Dutasteride  3 times weekly   3.  Erectile dysfunction Generic tadalafil  20 mg   HPI: Alfred Edwards is a 79 y.o. male  Since last year's visit has noted some increased nocturia due to starting hydrochlorothiazide  Urologic ROS otherwise negative PSA 09/24/2024 was 2.5 (uncorrected)  PSA trend (uncorrected)   Prostate Specific Ag, Serum  Latest Ref Rng 0.0 - 4.0 ng/mL  08/28/2019 2.7   09/16/2020 2.5   09/20/2021 5.0   06/24/2022 2.7   03/29/2023 2.9   09/18/2023 3.2   09/24/2024 2.5     PMH: Past Medical History:  Diagnosis Date   Biceps tendinitis, right 12/03/2021   BPH (benign prostatic hypertrophy)    Elevated PSA 08/28/2017   GERD (gastroesophageal reflux disease)    Hyperlipidemia    Hypertension    Stroke [facility])    TIA (transient ischemic attack) 11/2018    Surgical History: Past Surgical History:  Procedure Laterality Date   COLONOSCOPY  2005, 01/2015   Dr Viktoria- cleared for 3 yrs   COLONOSCOPY WITH PROPOFOL  N/A 10/02/2023   Procedure: COLONOSCOPY WITH PROPOFOL ;  Surgeon: Onita Elspeth Sharper, DO;  Location: [facility] ENDOSCOPY;  Service: Gastroenterology;  Laterality: N/A;   TONSILLECTOMY     UPPER GASTROINTESTINAL ENDOSCOPY  01/2015   Dr Viktoria- erosions on esophagus    Home Medications:  Allergies as of 09/25/2024   No Known Allergies      Medication List        Accurate as of September 25, 2024  9:43 AM. If you have any questions, ask your nurse or doctor.          aspirin  EC 81 MG tablet Commonly known as: ASPIRIN  LOW DOSE Take 1 tablet (81 mg total) by mouth daily. Swallow whole.   CENTRUM SILVER ADULT 50+  PO Take 1 tablet by mouth daily.   CoQ-10 100 MG Caps Take 1 capsule by mouth daily.   dutasteride  0.5 MG capsule Commonly known as: AVODART  Take 1 capsule by mouth once daily   Fish Oil  1000 MG Caps Take 1 capsule (1,000 mg total) by mouth 2 (two) times daily.   hydrochlorothiazide  12.5 MG tablet Commonly known as: HYDRODIURIL  TAKE 1 TABLET BY MOUTH EVERY DAY   NIFEdipine  30 MG 24 hr tablet Commonly known as: PROCARDIA -XL/NIFEDICAL-XL Take 1 tablet (30 mg total) by mouth daily.   omeprazole 20 MG tablet Commonly known as: PRILOSEC OTC Take 20 mg by mouth daily as needed.   simvastatin  20 MG tablet Commonly known as: ZOCOR  Take 1 tablet (20 mg total) by mouth daily.   Vitamin C 500 MG Caps Take 1 capsule by mouth 2 (two) times daily.   Vitamin D3 50 MCG (2000 UT) capsule Take 1 capsule by mouth daily.   Zinc 50 MG Tabs Take 50 mg by mouth daily.        Allergies: Allergies[1]  Family History: Family History  Problem Relation Age of Onset   Congestive Heart Failure Mother    Heart murmur Mother    Heart disease Father    Coronary artery disease Father    Heart disease Brother  Social History:  reports that he quit smoking about 53 years ago. His smoking use included cigarettes. He started smoking about 59 years ago. He has a 3 pack-year smoking history. He has been exposed to tobacco smoke. He has never used smokeless tobacco. He reports current alcohol use of about 15.0 standard drinks of alcohol per week. He reports that he does not use drugs.   Physical Exam: BP (!) 163/71   Pulse 76   Ht 5' 8 (1.727 m)   Wt 172 lb (78 kg)   BMI 26.15 kg/m   Constitutional:  Alert, No acute distress. HEENT:  AT Respiratory: Normal respiratory effort, no increased work of breathing. Psychiatric: Normal mood and affect.   Assessment & Plan:    1.  BPH with LUTS Stable on dutasteride  Continue annual follow-up  2.  Elevated PSA Stable We discussed the  incidence of high-grade prostate cancer is low in his age group and recommend discontinuing PSA checks He has requested to continue checks for the next few years   Glendia JAYSON Barba, MD  [facility] [address], [address] [place], [place] [address] [phone]    [1] No Known Allergies  "

## 2024-11-15 ENCOUNTER — Ambulatory Visit: Admitting: Physician Assistant

## 2024-11-19 ENCOUNTER — Ambulatory Visit: Admitting: Physician Assistant

## 2024-11-20 ENCOUNTER — Ambulatory Visit: Admitting: Physician Assistant

## 2025-02-26 ENCOUNTER — Ambulatory Visit

## 2025-09-19 ENCOUNTER — Other Ambulatory Visit

## 2025-09-26 ENCOUNTER — Ambulatory Visit: Admitting: Urology
